# Patient Record
Sex: Male | Born: 1955
Health system: Southern US, Community
[De-identification: ages and names within clinical notes are randomized; demographics above are authoritative.]

## PROBLEM LIST (undated history)

## (undated) DIAGNOSIS — J45909 Unspecified asthma, uncomplicated: Secondary | ICD-10-CM

## (undated) DIAGNOSIS — Z87891 Personal history of nicotine dependence: Secondary | ICD-10-CM

## (undated) DIAGNOSIS — I10 Essential (primary) hypertension: Secondary | ICD-10-CM

## (undated) DIAGNOSIS — R29818 Other symptoms and signs involving the nervous system: Secondary | ICD-10-CM

## (undated) DIAGNOSIS — I4891 Unspecified atrial fibrillation: Secondary | ICD-10-CM

## (undated) DIAGNOSIS — I428 Other cardiomyopathies: Secondary | ICD-10-CM

## (undated) DIAGNOSIS — E119 Type 2 diabetes mellitus without complications: Secondary | ICD-10-CM

## (undated) HISTORY — PX: MOUTH SURGERY: SHX715

---

## 2015-07-31 DIAGNOSIS — I4891 Unspecified atrial fibrillation: Secondary | ICD-10-CM

## 2015-07-31 HISTORY — DX: Unspecified atrial fibrillation: I48.91

## 2015-08-04 ENCOUNTER — Inpatient Hospital Stay (HOSPITAL_COMMUNITY): Payer: BLUE CROSS/BLUE SHIELD

## 2015-08-04 ENCOUNTER — Inpatient Hospital Stay (HOSPITAL_COMMUNITY)
Admission: EM | Admit: 2015-08-04 | Discharge: 2015-08-12 | DRG: 308 | Disposition: A | Discharge status: Home / Self Care | Payer: BLUE CROSS/BLUE SHIELD | Attending: Cardiology | Admitting: Cardiology

## 2015-08-04 ENCOUNTER — Emergency Department (HOSPITAL_COMMUNITY): Payer: BLUE CROSS/BLUE SHIELD

## 2015-08-04 ENCOUNTER — Encounter (HOSPITAL_COMMUNITY): Payer: Self-pay

## 2015-08-04 DIAGNOSIS — R29818 Other symptoms and signs involving the nervous system: Secondary | ICD-10-CM | POA: Diagnosis present

## 2015-08-04 DIAGNOSIS — E119 Type 2 diabetes mellitus without complications: Secondary | ICD-10-CM

## 2015-08-04 DIAGNOSIS — I481 Persistent atrial fibrillation: Principal | ICD-10-CM | POA: Diagnosis present

## 2015-08-04 DIAGNOSIS — Z87891 Personal history of nicotine dependence: Secondary | ICD-10-CM | POA: Diagnosis not present

## 2015-08-04 DIAGNOSIS — Z23 Encounter for immunization: Secondary | ICD-10-CM | POA: Diagnosis not present

## 2015-08-04 DIAGNOSIS — J45998 Other asthma: Secondary | ICD-10-CM | POA: Diagnosis present

## 2015-08-04 DIAGNOSIS — Z6841 Body Mass Index (BMI) 40.0 and over, adult: Secondary | ICD-10-CM

## 2015-08-04 DIAGNOSIS — I110 Hypertensive heart disease with heart failure: Secondary | ICD-10-CM | POA: Diagnosis present

## 2015-08-04 DIAGNOSIS — E785 Hyperlipidemia, unspecified: Secondary | ICD-10-CM | POA: Diagnosis present

## 2015-08-04 DIAGNOSIS — G4733 Obstructive sleep apnea (adult) (pediatric): Secondary | ICD-10-CM | POA: Diagnosis present

## 2015-08-04 DIAGNOSIS — I4891 Unspecified atrial fibrillation: Secondary | ICD-10-CM

## 2015-08-04 DIAGNOSIS — Z8249 Family history of ischemic heart disease and other diseases of the circulatory system: Secondary | ICD-10-CM | POA: Diagnosis not present

## 2015-08-04 DIAGNOSIS — I5021 Acute systolic (congestive) heart failure: Secondary | ICD-10-CM

## 2015-08-04 DIAGNOSIS — E875 Hyperkalemia: Secondary | ICD-10-CM

## 2015-08-04 DIAGNOSIS — I5041 Acute combined systolic (congestive) and diastolic (congestive) heart failure: Secondary | ICD-10-CM | POA: Diagnosis not present

## 2015-08-04 DIAGNOSIS — I495 Sick sinus syndrome: Secondary | ICD-10-CM | POA: Diagnosis present

## 2015-08-04 DIAGNOSIS — R030 Elevated blood-pressure reading, without diagnosis of hypertension: Secondary | ICD-10-CM

## 2015-08-04 DIAGNOSIS — R739 Hyperglycemia, unspecified: Secondary | ICD-10-CM

## 2015-08-04 DIAGNOSIS — I509 Heart failure, unspecified: Secondary | ICD-10-CM

## 2015-08-04 DIAGNOSIS — E1165 Type 2 diabetes mellitus with hyperglycemia: Secondary | ICD-10-CM | POA: Diagnosis present

## 2015-08-04 DIAGNOSIS — I255 Ischemic cardiomyopathy: Secondary | ICD-10-CM | POA: Diagnosis present

## 2015-08-04 DIAGNOSIS — J45909 Unspecified asthma, uncomplicated: Secondary | ICD-10-CM | POA: Diagnosis present

## 2015-08-04 DIAGNOSIS — I340 Nonrheumatic mitral (valve) insufficiency: Secondary | ICD-10-CM | POA: Diagnosis not present

## 2015-08-04 DIAGNOSIS — N179 Acute kidney failure, unspecified: Secondary | ICD-10-CM | POA: Diagnosis present

## 2015-08-04 DIAGNOSIS — IMO0001 Reserved for inherently not codable concepts without codable children: Secondary | ICD-10-CM

## 2015-08-04 DIAGNOSIS — G4730 Sleep apnea, unspecified: Secondary | ICD-10-CM | POA: Diagnosis not present

## 2015-08-04 HISTORY — DX: Morbid (severe) obesity due to excess calories: E66.01

## 2015-08-04 HISTORY — DX: Personal history of nicotine dependence: Z87.891

## 2015-08-04 HISTORY — DX: Unspecified atrial fibrillation: I48.91

## 2015-08-04 HISTORY — DX: Essential (primary) hypertension: I10

## 2015-08-04 HISTORY — DX: Other cardiomyopathies: I42.8

## 2015-08-04 HISTORY — DX: Unspecified asthma, uncomplicated: J45.909

## 2015-08-04 HISTORY — DX: Other symptoms and signs involving the nervous system: R29.818

## 2015-08-04 LAB — PROTIME-INR
INR: 1.15 (ref 0.00–1.49)
INR: 1.2 (ref 0.00–1.49)
PROTHROMBIN TIME: 15.3 s — AB (ref 11.6–15.2)
Prothrombin Time: 14.8 seconds (ref 11.6–15.2)

## 2015-08-04 LAB — I-STAT TROPONIN, ED: TROPONIN I, POC: 0.01 ng/mL (ref 0.00–0.08)

## 2015-08-04 LAB — CBC WITH DIFFERENTIAL/PLATELET
BASOS ABS: 0.1 10*3/uL (ref 0.0–0.1)
Basophils Relative: 1 %
Eosinophils Absolute: 0.2 10*3/uL (ref 0.0–0.7)
Eosinophils Relative: 2 %
HEMATOCRIT: 48.3 % (ref 39.0–52.0)
HEMOGLOBIN: 15.4 g/dL (ref 13.0–17.0)
LYMPHS PCT: 20 %
Lymphs Abs: 1.7 10*3/uL (ref 0.7–4.0)
MCH: 29 pg (ref 26.0–34.0)
MCHC: 31.9 g/dL (ref 30.0–36.0)
MCV: 91 fL (ref 78.0–100.0)
MONO ABS: 0.4 10*3/uL (ref 0.1–1.0)
Monocytes Relative: 4 %
NEUTROS ABS: 6.2 10*3/uL (ref 1.7–7.7)
Neutrophils Relative %: 73 %
Platelets: 218 10*3/uL (ref 150–400)
RBC: 5.31 MIL/uL (ref 4.22–5.81)
RDW: 15.1 % (ref 11.5–15.5)
WBC: 8.5 10*3/uL (ref 4.0–10.5)

## 2015-08-04 LAB — TSH: TSH: 3.383 u[IU]/mL (ref 0.350–4.500)

## 2015-08-04 LAB — BASIC METABOLIC PANEL
Anion gap: 11 (ref 5–15)
BUN: 24 mg/dL — AB (ref 6–20)
CHLORIDE: 108 mmol/L (ref 101–111)
CO2: 20 mmol/L — AB (ref 22–32)
Calcium: 9.1 mg/dL (ref 8.9–10.3)
Creatinine, Ser: 1.24 mg/dL (ref 0.61–1.24)
GFR calc Af Amer: >60 mL/min (ref >60)
GFR calc non Af Amer: >60 mL/min (ref >60)
GLUCOSE: 143 mg/dL — AB (ref 65–99)
POTASSIUM: 5.2 mmol/L — AB (ref 3.5–5.1)
Sodium: 139 mmol/L (ref 135–145)

## 2015-08-04 LAB — T4, FREE: Free T4: 1.17 ng/dL — ABNORMAL HIGH (ref 0.61–1.12)

## 2015-08-04 LAB — BRAIN NATRIURETIC PEPTIDE: B Natriuretic Peptide: 214.6 pg/mL — ABNORMAL HIGH (ref 0.0–100.0)

## 2015-08-04 LAB — GLUCOSE, CAPILLARY: Glucose-Capillary: 159 mg/dL — ABNORMAL HIGH (ref 65–99)

## 2015-08-04 LAB — MAGNESIUM: Magnesium: 2.3 mg/dL (ref 1.7–2.4)

## 2015-08-04 LAB — APTT
APTT: 28 s (ref 24–37)
aPTT: 29 seconds (ref 24–37)

## 2015-08-04 LAB — PHOSPHORUS: Phosphorus: 2.6 mg/dL (ref 2.5–4.6)

## 2015-08-04 LAB — POTASSIUM: POTASSIUM: 4.7 mmol/L (ref 3.5–5.1)

## 2015-08-04 MED ORDER — SODIUM CHLORIDE 0.9 % IJ SOLN
3.0000 mL | Freq: Two times a day (BID) | INTRAMUSCULAR | Status: DC
  Administered 2015-08-04 – 2015-08-11 (×7): 3 mL via INTRAVENOUS

## 2015-08-04 MED ORDER — ACETAMINOPHEN 325 MG PO TABS: 650.0000 mg | ORAL_TABLET | ORAL | Status: DC | PRN

## 2015-08-04 MED ORDER — DILTIAZEM HCL 100 MG IV SOLR
5.0000 mg/h | INTRAVENOUS | Status: DC
  Administered 2015-08-04: 10 mg/h via INTRAVENOUS
  Administered 2015-08-05: 5 mg/h via INTRAVENOUS
  Filled 2015-08-04 (×2): qty 100

## 2015-08-04 MED ORDER — HEPARIN (PORCINE) IN NACL 100-0.45 UNIT/ML-% IJ SOLN
1500.0000 [IU]/h | INTRAMUSCULAR | Status: AC
  Administered 2015-08-04: 1500 [IU]/h via INTRAVENOUS
  Filled 2015-08-04: qty 250

## 2015-08-04 MED ORDER — HEPARIN (PORCINE) IN NACL 100-0.45 UNIT/ML-% IJ SOLN
1500.0000 [IU]/h | INTRAMUSCULAR | Status: DC
  Administered 2015-08-04: 1500 [IU]/h via INTRAVENOUS
  Filled 2015-08-04: qty 250

## 2015-08-04 MED ORDER — SODIUM CHLORIDE 0.9 % IV SOLN
250.0000 mL | INTRAVENOUS | Status: DC | PRN
  Administered 2015-08-12: 09:00:00 via INTRAVENOUS

## 2015-08-04 MED ORDER — ONDANSETRON HCL 4 MG/2ML IJ SOLN: 4.0000 mg | Freq: Four times a day (QID) | INTRAMUSCULAR | Status: DC | PRN

## 2015-08-04 MED ORDER — METOPROLOL TARTRATE 25 MG PO TABS
25.0000 mg | ORAL_TABLET | Freq: Two times a day (BID) | ORAL | Status: DC
  Administered 2015-08-04 – 2015-08-05 (×3): 25 mg via ORAL
  Filled 2015-08-04 (×3): qty 1

## 2015-08-04 MED ORDER — SODIUM CHLORIDE 0.9 % IJ SOLN: 3.0000 mL | INTRAMUSCULAR | Status: DC | PRN

## 2015-08-04 MED ORDER — FUROSEMIDE 10 MG/ML IJ SOLN
40.0000 mg | Freq: Two times a day (BID) | INTRAMUSCULAR | Status: DC
  Administered 2015-08-04 – 2015-08-08 (×8): 40 mg via INTRAVENOUS
  Filled 2015-08-04 (×8): qty 4

## 2015-08-04 MED ORDER — DILTIAZEM HCL 25 MG/5ML IV SOLN
10.0000 mg | Freq: Once | INTRAVENOUS | Status: AC
  Administered 2015-08-04: 10 mg via INTRAVENOUS

## 2015-08-04 MED ORDER — DILTIAZEM HCL 100 MG IV SOLR
5.0000 mg/h | Freq: Once | INTRAVENOUS | Status: AC
  Administered 2015-08-04: 5 mg/h via INTRAVENOUS
  Filled 2015-08-04: qty 100

## 2015-08-04 MED ORDER — HEPARIN BOLUS VIA INFUSION
6000.0000 [IU] | Freq: Once | INTRAVENOUS | Status: AC
  Administered 2015-08-04: 6000 [IU] via INTRAVENOUS
  Filled 2015-08-04: qty 6000

## 2015-08-04 MED ORDER — METOPROLOL TARTRATE 1 MG/ML IV SOLN
5.0000 mg | Freq: Once | INTRAVENOUS | Status: AC
  Administered 2015-08-04: 5 mg via INTRAVENOUS
  Filled 2015-08-04: qty 5

## 2015-08-04 MED ORDER — RIVAROXABAN 20 MG PO TABS
20.0000 mg | ORAL_TABLET | Freq: Every day | ORAL | Status: DC
  Administered 2015-08-04 – 2015-08-12 (×9): 20 mg via ORAL
  Filled 2015-08-04 (×9): qty 1

## 2015-08-04 NOTE — ED Notes (Signed)
Spoke w/ pharmacist about transitioning from Heparin to Xarelto - stop Heparin at same time as giving first dose of Xarelto.

## 2015-08-04 NOTE — Progress Notes (Addendum)
ANTICOAGULATION CONSULT NOTE - Initial Consult  Pharmacy Consult for heparin Indication: atrial fibrillation  No Known Allergies  Patient Measurements: Height: 5\' 8"  (172.7 cm) Weight: 293 lb (132.904 kg) IBW/kg (Calculated) : 68.4 Heparin Dosing Weight: 99.7kg  Vital Signs: Temp: 98.3 F (36.8 C) (01/05 0958) Temp Source: Oral (01/05 0958) BP: 144/92 mmHg (01/05 1045) Pulse Rate: 124 (01/05 1045)  Labs:  Recent Labs  08/04/15 1015  HGB 15.4  HCT 48.3  PLT 218  APTT 29  LABPROT 14.8  INR 1.15    CrCl cannot be calculated (Patient has no serum creatinine result on file.).   Medical History: Past Medical History  Diagnosis Date  . Asthma     Assessment: 59 yom with SOB. Pharmacy consulted to dose heparin for afib. No anticoag documented pta. CBC wnl, no bleed documented.  Goal of Therapy:  Heparin level 0.3-0.7 units/ml Monitor platelets by anticoagulation protocol: Yes   Plan:  Heparin 6000 unit bolus Heparin at 1500 units/h 6h HL Daily HL/CBC Mon s/sx bleeding  Babs Bertin, PharmD, BCPS Clinical Pharmacist Pager [phone] 08/04/2015 11:52 AM   ADDENDUM  Per MD - To switch to Xarelto later this evening (at supper ok). Communicated with RN to d/c Xarelto at time heparin drip is turned off. CrCl~87  Plan Heparin 1500 units/h >> Xarelto 20mg  qsupper - start this afternoon per MD Mon CBC, s/sx bleeding  Babs Bertin, PharmD, [facility] Clinical Pharmacist Pager [phone] 08/04/2015 1:56 PM

## 2015-08-04 NOTE — ED Provider Notes (Signed)
CSN: [id number]     Arrival date & time 08/04/15  4098 History   First MD Initiated Contact with Patient 08/04/15 [phone]     Chief Complaint  Patient presents with  . Shortness of Breath     (Consider location/radiation/quality/duration/timing/severity/associated sxs/prior Treatment) HPI 60 year old male who presents with dyspnea on exertion and fatigue. History of childhood asthma, and has not seen a medical care provider since 2005 so has unknown past medical history. States that since 5 days ago he has developed progressive dyspnea on exertion with fatigue. With daily activities that he is normally able to do, he feels winded and out of breath. Has had chronic edema in his lower extremities which she states has not changed. For the past year has also been sleeping upright because of difficulty breathing when he lays flat. No chest pain, syncope or near syncope. No recent illnesses and denies any fevers, cough, congestion, runny nose, abdominal pain, nausea or vomiting, or diarrhea. Denies any consistent alcohol usage.  Past Medical History  Diagnosis Date  . Asthma   . Morbid obesity (HCC)   . Suspected sleep apnea   . Former tobacco use    History reviewed. No pertinent past surgical history. Family History  Problem Relation Age of Onset  . CAD Mother     CABG age 44  . CAD Maternal Aunt     CABG age 93   Social History  Substance Use Topics  . Smoking status: Former Games developer  . Smokeless tobacco: None     Comment: Smoked for 20 years, quit ~1995  . Alcohol Use: No    Review of Systems 10/14 systems reviewed and are negative other than those stated in the HPI   Allergies  Review of patient's allergies indicates no known allergies.  Home Medications   Prior to Admission medications   Medication Sig Start Date End Date Taking? Authorizing Provider  ePHEDrine-GuaiFENesin (BRONKAID) 25-400 MG TABS Take 1 tablet by mouth every 6 (six) hours as needed (cough syptoms).   Yes  Historical Provider, MD   BP 131/101 mmHg  Pulse 126  Temp(Src) 98.3 F (36.8 C) (Oral)  Resp 29  Ht  (1.727 m)  Wt 293 lb (132.904 kg)  BMI 44.56 kg/m2  SpO2 95% Physical Exam Physical Exam  Nursing note and vitals reviewed. Constitutional: Well developed, well nourished, non-toxic, and in no acute distress Head: Normocephalic and atraumatic.  Mouth/Throat: Oropharynx is clear and moist.  Neck: Normal range of motion. Neck supple.  Cardiovascular: tachycardic rate and irregularly irregular rhythm, b/l trace pedal edema Pulmonary/Chest: Effort normal and breath sounds normal.  Abdominal: Soft. There is no tenderness. There is no rebound and no guarding.  Musculoskeletal: Normal range of motion.  Neurological: Alert, no facial droop, fluent speech, moves all extremities symmetrically Skin: Skin is warm and dry.  Psychiatric: Cooperative  ED Course  Procedures (including critical care time) Labs Review Labs Reviewed  PROTIME-INR - Abnormal; Notable for the following:    Prothrombin Time 15.3 (*)    All other components within normal limits  T4, FREE - Abnormal; Notable for the following:    Free T4 1.17 (*)    All other components within normal limits  BASIC METABOLIC PANEL - Abnormal; Notable for the following:    Potassium 5.2 (*)    CO2 20 (*)    Glucose, Bld 143 (*)    BUN 24 (*)    All other components within normal limits  CBC WITH DIFFERENTIAL/PLATELET  APTT  PROTIME-INR  TSH  APTT  MAGNESIUM  PHOSPHORUS  HEPARIN LEVEL (UNFRACTIONATED)  I-STAT TROPOININ, ED    Imaging Review Dg Chest Portable 1 View  08/04/2015  CLINICAL DATA:  Shortness of breath, atrial fibrillation EXAM: PORTABLE CHEST 1 VIEW COMPARISON:  None. FINDINGS: Diffuse bilateral interstitial thickening. Trace bilateral pleural effusions. No pneumothorax. Normal cardiomediastinal silhouette. No acute osseous abnormality. IMPRESSION: Findings most consistent with mild pulmonary edema.  Electronically Signed   By: Elige Ko   On: 08/04/2015 10:40   I have personally reviewed and evaluated these images and lab results as part of my medical decision-making.   EKG Interpretation   Date/Time:  Thursday August 04 2015 09:41:04 EST Ventricular Rate:  163 PR Interval:    QRS Duration: 82 QT Interval:  294 QTC Calculation: 484 R Axis:   100 Text Interpretation:  Atrial fibrillation Right axis deviation  Anteroseptal infarct, old Baseline wander in lead(s) V2 No prior for  comparison New onset atrial fibrillation Confirmed by Dare Spillman MD, Bud Kaeser [phone])  on 08/04/2015 10:41:26 AM      Angiocath insertion Performed by: Lavera Guise  Consent: Verbal consent obtained. Risks and benefits: risks, benefits and alternatives were discussed Time out: Immediately prior to procedure a "time out" was called to verify the correct patient, procedure, equipment, support staff and site/side marked as required.  Preparation: Patient was prepped and draped in the usual sterile fashion.  Vein Location: left antecubital  Ultrasound Guided  Gauge: 20G  Normal blood return and flush without difficulty Patient tolerance: Patient tolerated the procedure well with no immediate complications.   CRITICAL CARE Performed by: Lavera Guise   Total critical care time: 30 minutes  Critical care time was exclusive of separately billable procedures and treating other patients.  Critical care was necessary to treat or prevent imminent or life-threatening deterioration.  Critical care was time spent personally by me on the following activities: development of treatment plan with patient and/or surrogate as well as nursing, discussions with consultants, evaluation of patient's response to treatment, examination of patient, obtaining history from patient or surrogate, ordering and performing treatments and interventions, ordering and review of laboratory studies, ordering and review of radiographic  studies, pulse oximetry and re-evaluation of patient's condition.   MDM   Final diagnoses:  Atrial fibrillation with RVR (HCC)  Acute on chronic congestive heart failure, unspecified congestive heart failure type [facility])    60 year old male with history of asthma who presents with dyspnea on exertion and fatigue. He is noted to be in new onset atrial fibrillation with RVR at a heart rate in the 160s. He is hemodynamically stable and in no respiratory distress. Mildly fluid overloaded on exam, and chest x-ray showing mild interstitial pneumonia. I given his symptoms recently or do suspect that he may have new onset heart failure as well. His troponin is negative and she has no major metabolic or  Electrolyte derangements. Potassium is 5.6 but slightly hemolyzed without EKG changes. It started on a diltiazem drip and heparin drip. Heart rate improving in the 120s. Repeat hemodynamically stable and comfortably breathing on room air. I discussed this patient with cardiology service who will admit for ongoing management.    Lavera Guise, MD 08/04/15 [phone]

## 2015-08-04 NOTE — ED Notes (Signed)
Per EMS - pt c/o shortness of breath since Sunday. Pt reports orthopnea, dyspnea w/ exertion. Denies CP. Pt at [facility] and advised to come here d/t afib on monitor and shortness of breath. Pt took breathing pill this morning to attempt to alleviate symptoms.

## 2015-08-04 NOTE — Progress Notes (Signed)
  Echocardiogram 2D Echocardiogram has been performed.  Arvil Chaco 08/04/2015, 3:24 PM

## 2015-08-04 NOTE — ED Notes (Signed)
IV team at bedside to attempt to start second IV

## 2015-08-04 NOTE — ED Notes (Signed)
Attempted report x1. 

## 2015-08-04 NOTE — ED Notes (Signed)
EDP at bedside attempting to start IV w/ ultrasound.

## 2015-08-04 NOTE — ED Notes (Signed)
Contacted phleb to obtain blood - pt difficult stick and difficult draw

## 2015-08-04 NOTE — H&P (Signed)
History and Physical  Patient ID: James [name] MRN: [id number], DOB: September 13, 1955 Date of Encounter: 08/04/2015, 12:24 PM Primary Physician: None Primary Cardiologist: New - Dr. Antoine Poche  Chief Complaint: SOB Reason for Admission: new onset atrial fib and CHF Requesting MD: Dr. Verdie Mosher  HPI: [name] is a 60 y/o M with history of remote asthma, morbid obesity, suspected OSA, former tobacco abuse who presented to [facility] today with dyspnea since Sunday 07/31/15. He has no prior cardiac history. He has never been tested for sleep apnea but reports heavy snoring and having been told by an ex-girlfriend that he stops breathing in the middle of the night. He's noticed his weight gradually increasing over the last 20 years. He has had LEE for about a year. On 07/31/15 he began to develop what he thought was asthma - had issues with this remotely, but not recently. He took some Bronkaid tablets (ephedrine-guaifenesin) but symptoms did not improve. SOB gradually got worse, progressing to DOE with even minimal activity and orthopnea preventing him from sleeping. He almost called EMS last night due to PND. He went to urgent care this AM where initial BP was 158/98 and he was in rapid atrial fib. He was transported to [facility] where initial HR was 156. He received 10mg  IV diltiazem along with a drip that has since been titrated. HR was still 120s-130s so we gave 5mg  IV lopressor with improvement in HR down to 95-105. He has been started on IV heparin by the ER. CXR most c/w mild pulmonary edema. He denies any chest pain, awareness of palpitations, prior history of similar symptoms, history of TIA/stroke, or prior bleeding issues. He currently feels stable at rest. No recent wheezing. Labwork reveals BMET with K 5.2 (slight hemolysis), BUN/Cr 24/1.24, glucose 143, troponin neg x 1, CBC WNL.    Past Medical History  Diagnosis Date  . Asthma   . Morbid obesity (HCC)   . Suspected sleep apnea   . Former tobacco use       Surgical History: History reviewed. No pertinent past surgical history.   Home Meds: Prior to Admission medications   Medication Sig Start Date End Date Taking? Authorizing Provider  ePHEDrine-GuaiFENesin (BRONKAID) 25-400 MG TABS Take 1 tablet by mouth every 6 (six) hours as needed (cough syptoms).   Yes Historical Provider, MD    Allergies: No Known Allergies  Social History   Social History  . Marital Status: Single    Spouse Name: N/A  . Number of Children: N/A  . Years of Education: N/A   Occupational History  . Machinist    Social History Main Topics  . Smoking status: Former Games developer  . Smokeless tobacco: Not on file     Comment: Smoked for 20 years, quit ~1995  . Alcohol Use: No  . Drug Use: No  . Sexual Activity: Not on file   Other Topics Concern  . Not on file   Social History Narrative     Family History  Problem Relation Age of Onset  . CAD Mother     CABG age 47  . CAD Maternal Aunt     CABG age 74    Review of Systems:No fever or chills. All other systems reviewed and are otherwise negative except as noted above.  Labs:   Lab Results  Component Value Date   WBC 8.5 08/04/2015   HGB 15.4 08/04/2015   HCT 48.3 08/04/2015   MCV 91.0 08/04/2015   PLT 218 08/04/2015   BMET,  TSH pending  Radiology/Studies:  Dg Chest Portable 1 View  08/04/2015  CLINICAL DATA:  Shortness of breath, atrial fibrillation EXAM: PORTABLE CHEST 1 VIEW COMPARISON:  None. FINDINGS: Diffuse bilateral interstitial thickening. Trace bilateral pleural effusions. No pneumothorax. Normal cardiomediastinal silhouette. No acute osseous abnormality. IMPRESSION: Findings most consistent with mild pulmonary edema. Electronically Signed   By: Elige Ko   On: 08/04/2015 10:40   Wt Readings from Last 3 Encounters:  08/04/15 293 lb (132.904 kg)   EKG: atrial fib 163bpm, right axis deviation, no acute ST-T changes  Physical Exam: Blood pressure 131/101, pulse 126, temperature  98.3 F (36.8 C), temperature source Oral, resp. rate 29, height  (1.727 m), weight 293 lb (132.904 kg), SpO2 95 %. Body mass index is 44.56 kg/(m^2).  General: Well developed morbidly obese WM in no acute distress. Head: Normocephalic, atraumatic, sclera non-icteric, no xanthomas, nares are without discharge.  Neck: Negative for carotid bruits. JVD not elevated. Lungs: Clear bilaterally to auscultation without wheezes, rales, or rhonchi. Breathing is unlabored. Heart: Irregularly irregular, tachycardic. No murmurs, rubs, or gallops appreciated. Abdomen: Soft, non-tender, non-distended with normoactive bowel sounds. No hepatomegaly. No rebound/guarding. No obvious abdominal masses. Msk:  Strength and tone appear normal for age. Extremities: No clubbing or cyanosis. Chronic venous stasis appearing edema - 1-2+ bilaterally superimposed on baseline large leg habitus  Distal pedal pulses in tact but difficult to feel due to edema. Neuro: Alert and oriented X 3. No focal deficit. No facial asymmetry. Moves all extremities spontaneously. Psych:  Responds to questions appropriately with a normal affect.    ASSESSMENT AND PLAN:   1. Atrial fibrillation with RVR - symptoms present since 07/31/15. CHADSVASC is presently 1 for clinical CHF but glucose is 143 and BP has been elevated, suspicious for HTN - will follow BP and check A1C. Discussed risks and benefits of Coumadin vs DOAC and the patient is agreeable to DOAC. Per d/w MD, will start Xarelto - ordered as Xarelto per pharmacy so he receives new start education from pharmacist. Check echo. If EF normal, would consider 3 weeks of anticoagulation followed by DCCV. If there is evidence of LV dysfunction or HR becomes difficult to control, would consider TEE/DCCV this admission.  The patient believes his obesity and undiagnosed OSA contributed to onset which may be the case. He understands if these remain untreated, he is at high risk for recurrent  arrhythmias. He seems motivated to make a change. Continue IV diltiazem and add oral beta blocker. (Note no evidence of wheezing or asthma flare at this time.) Thyroid function pending.  2. Acute CHF, unknown if systolic or diastolic - check echo. Anticipate better rate control will help volume status. Start IV Lasix  BID. Follow I/O's and daily wts.  3. Morbid obesity with suspected sleep apnea - we discussed lifestyle modifications. He will need sleep study arranged at discharge.  4. Elevated blood pressure - may represent HTN. Follow.  5. Possible hyperkalemia - suspect hemoylsis. Repeat K with next lab check.  6. Hyperglycemia - check A1C.  Signed, Laurann Montana PA-C 08/04/2015, 12:24 PM Pager: [phone]  History and all data above reviewed.  Patient examined.  I agree with the findings as above.  Very nice patient who has not seen a physician in many years.  Now presents with one week of acute SOB and decreased exercise tolerance.  Found to be in atrial fib with rapid rate.  No chest pain.    The patient exam reveals [id number]  ,  Lungs: Clear  ,  Abd: Positive bowel sounds, no rebound no guarding, Ext Moderate edema   .  All available labs, radiology testing, previous records reviewed. Agree with documented assessment and plan. Atrial fib:  For not rate control and anticoagulation with probable need for DCCV in the future.  Needs diuresis.  Check echocardiogram.  IV Dilt for now.  Start beta blocker.  IV diuresis.  Out patient sleep study.  I started education on weight loss.    Evelio Cyrus [place]  2:05 PM  08/04/2015

## 2015-08-05 ENCOUNTER — Inpatient Hospital Stay (HOSPITAL_COMMUNITY): Payer: BLUE CROSS/BLUE SHIELD

## 2015-08-05 ENCOUNTER — Other Ambulatory Visit: Payer: Self-pay | Admitting: *Deleted

## 2015-08-05 ENCOUNTER — Encounter (HOSPITAL_COMMUNITY): Payer: Self-pay | Admitting: General Practice

## 2015-08-05 DIAGNOSIS — G4733 Obstructive sleep apnea (adult) (pediatric): Secondary | ICD-10-CM

## 2015-08-05 DIAGNOSIS — I5021 Acute systolic (congestive) heart failure: Secondary | ICD-10-CM

## 2015-08-05 LAB — LIPID PANEL
Cholesterol: 177 mg/dL (ref 0–200)
HDL: 35 mg/dL — ABNORMAL LOW (ref >40)
LDL CALC: 121 mg/dL — AB (ref 0–99)
TRIGLYCERIDES: 105 mg/dL (ref <150)
Total CHOL/HDL Ratio: 5.1 RATIO
VLDL: 21 mg/dL (ref 0–40)

## 2015-08-05 LAB — BASIC METABOLIC PANEL
ANION GAP: 10 (ref 5–15)
BUN: 35 mg/dL — ABNORMAL HIGH (ref 6–20)
CALCIUM: 9.3 mg/dL (ref 8.9–10.3)
CO2: 25 mmol/L (ref 22–32)
Chloride: 106 mmol/L (ref 101–111)
Creatinine, Ser: 1.85 mg/dL — ABNORMAL HIGH (ref 0.61–1.24)
GFR, EST AFRICAN AMERICAN: 45 mL/min — AB (ref >60)
GFR, EST NON AFRICAN AMERICAN: 39 mL/min — AB (ref >60)
GLUCOSE: 116 mg/dL — AB (ref 65–99)
POTASSIUM: 4.8 mmol/L (ref 3.5–5.1)
Sodium: 141 mmol/L (ref 135–145)

## 2015-08-05 LAB — CBC
HEMATOCRIT: 48.2 % (ref 39.0–52.0)
HEMOGLOBIN: 15.2 g/dL (ref 13.0–17.0)
MCH: 28.8 pg (ref 26.0–34.0)
MCHC: 31.5 g/dL (ref 30.0–36.0)
MCV: 91.3 fL (ref 78.0–100.0)
Platelets: 206 10*3/uL (ref 150–400)
RBC: 5.28 MIL/uL (ref 4.22–5.81)
RDW: 15.3 % (ref 11.5–15.5)
WBC: 9.5 10*3/uL (ref 4.0–10.5)

## 2015-08-05 LAB — HEMOGLOBIN A1C
HEMOGLOBIN A1C: 6.9 % — AB (ref 4.8–5.6)
MEAN PLASMA GLUCOSE: 151 mg/dL

## 2015-08-05 MED ORDER — INFLUENZA VAC SPLIT QUAD 0.5 ML IM SUSY
0.5000 mL | PREFILLED_SYRINGE | INTRAMUSCULAR | Status: AC
  Administered 2015-08-06: 0.5 mL via INTRAMUSCULAR
  Filled 2015-08-05: qty 0.5

## 2015-08-05 MED ORDER — CARVEDILOL 6.25 MG PO TABS
6.2500 mg | ORAL_TABLET | Freq: Two times a day (BID) | ORAL | Status: DC
  Administered 2015-08-05 – 2015-08-06 (×3): 6.25 mg via ORAL
  Filled 2015-08-05: qty 2
  Filled 2015-08-05 (×2): qty 1

## 2015-08-05 MED ORDER — LIVING WELL WITH DIABETES BOOK
Freq: Once | Status: AC
  Administered 2015-08-05: 17:00:00
  Filled 2015-08-05: qty 1

## 2015-08-05 MED ORDER — OFF THE BEAT BOOK
Freq: Once | Status: AC
  Administered 2015-08-05: 17:00:00
  Filled 2015-08-05: qty 1

## 2015-08-05 MED ORDER — REGADENOSON 0.4 MG/5ML IV SOLN
INTRAVENOUS | Status: AC
  Administered 2015-08-05: 0.4 mg via INTRAVENOUS
  Filled 2015-08-05: qty 5

## 2015-08-05 MED ORDER — REGADENOSON 0.4 MG/5ML IV SOLN
0.4000 mg | Freq: Once | INTRAVENOUS | Status: AC
  Administered 2015-08-05: 0.4 mg via INTRAVENOUS

## 2015-08-05 MED ORDER — PNEUMOCOCCAL VAC POLYVALENT 25 MCG/0.5ML IJ INJ
0.5000 mL | INJECTION | INTRAMUSCULAR | Status: AC
Start: 2015-08-06 — End: 2015-08-06
  Administered 2015-08-06: 0.5 mL via INTRAMUSCULAR
  Filled 2015-08-05: qty 0.5

## 2015-08-05 NOTE — Progress Notes (Signed)
Sleep study schedule @ [facility], Monday 08/08/15 @8pm . Otherwise no opening until Feb 2017. Case Manager will arrange CAPA.   Miraj Truss, PAC

## 2015-08-05 NOTE — Care Management Note (Addendum)
Case Management Note Donn Pierini RN, BSN Unit 2W-Case Manager [phone] Covering 3W  Patient Details  Name: James [name] MRN: [id number] Date of Birth: Dec 19, 1955  Subjective/Objective:  Pt admitted with new onset afib, pulm. edema                  Action/Plan: PTA pt lived at home, independent- anticipate return home when medically stable. Referral received for Xarelto benefits check- tried to submit request- but insurance states speciality med. Not covered?- spoke with pt at bedside- who states that he just started new plan with [facility] Jan. 1. And reports that plan has medication coverage. Explained to pt insurance benefit request and what they state to [place]- pt to call # on insurance card himself to request if Xarelto is covered and what his copay would be with his new plan. Gave pt both the 30 day free card and the copay assist card for Xarelto. Pt verbalized understanding that Xarelto copay cost may be high for him and understands to discuss with MD if this is the case on what an alternate medication may be.   Expected Discharge Date:                  Expected Discharge Plan:  Home/Self Care  In-House Referral:     Discharge planning Services  CM Consult, Medication Assistance  Post Acute Care Choice:    Choice offered to:     DME Arranged:    DME Agency:     HH Arranged:    HH Agency:     Status of Service:  In process, will continue to follow  Medicare Important Message Given:    Date Medicare IM Given:    Medicare IM give by:    Date Additional Medicare IM Given:    Additional Medicare Important Message give by:     If discussed at Long Length of Stay Meetings, dates discussed:    Additional Comments:  08/05/15- 1315- Donn Pierini RN, CM- referral for CPAP at home- also discussed with PA regarding sleep study who states message has been left for [facility]- they will call pt with appointment date, per PA- cpap settings are to be what they were here in hospital  for now. Order placed for CPAP- spoke with James [name] at [facility] regarding CPAP needs for home- he will f/u with pt prior to discharge.   Darrold Span, RN 08/05/2015, 11:49 AM

## 2015-08-05 NOTE — Progress Notes (Signed)
Patient Name: James [name] Date of Encounter: 08/05/2015   SUBJECTIVE  Slept well on CAPA. Breathing improved. No cp or palpitations. DC IV Cardizem overnight.   CURRENT MEDS . furosemide  40 mg Intravenous BID  . metoprolol tartrate  25 mg Oral BID  . rivaroxaban  20 mg Oral Q supper  . sodium chloride  3 mL Intravenous Q12H    OBJECTIVE  Filed Vitals:   08/04/15 2343 08/04/15 2358 08/05/15 0459 08/05/15 0755  BP: 101/63  133/98 136/97  Pulse: 74 75 76 81  Temp: 97.5 F (36.4 C)  97.8 F (36.6 C) 98.3 F (36.8 C)  TempSrc: Oral  Axillary Oral  Resp: Height:      Weight:   286 lb 8 oz (129.956 kg)   SpO2: 98% 97% 96% 97%    Intake/Output Summary (Last 24 hours) at 08/05/15 1011 Last data filed at 08/05/15 0900  Gross per 24 hour  Intake 494.55 ml  Output   2175 ml  Net -1680.45 ml   Filed Weights   08/04/15 0958 08/04/15 1554 08/05/15 0459  Weight: 293 lb (132.904 kg) 288 lb 12.8 oz (130.999 kg) 286 lb 8 oz (129.956 kg)    PHYSICAL EXAM  General: Pleasant, NAD. Neuro: Alert and oriented X 3. Moves all extremities spontaneously. Psych: Normal affect. HEENT:  Normal  Neck: Supple without bruits or JVD. Lungs:  Resp regular and unlabored. Bibasilar rales.  Heart: IR IR  no s3, s4, or murmurs. Abdomen: Soft, non-tender, non-distended, BS + x 4.  Extremities: No clubbing, cyanosis. Trace to 1+ BL LE edema. DP/PT/Radials 2+ and equal bilaterally.  Accessory Clinical Findings  CBC  Recent Labs  08/04/15 1015 08/05/15 0534  WBC 8.5 9.5  NEUTROABS 6.2  --   HGB 15.4 15.2  HCT 48.3 48.2  MCV 91.0 91.3  PLT 218 206   Basic Metabolic Panel  Recent Labs  08/04/15 1141 08/04/15 1343 08/05/15 0534  NA 139  --  141  K 5.2* 4.7 4.8  CL 108  --  106  CO2 20*  --  25  GLUCOSE 143*  --  116*  BUN 24*  --  35*  CREATININE 1.24  --  1.85*  CALCIUM 9.1  --  9.3  MG 2.3  --   --   PHOS 2.6  --   --    Hemoglobin A1C  Recent Labs  08/04/15 1343  HGBA1C 6.9*   Fasting Lipid Panel  Recent Labs  08/05/15 0534  CHOL 177  HDL 35*  LDLCALC 121*  TRIG 105  CHOLHDL 5.1   Thyroid Function Tests  Recent Labs  08/04/15 1141  TSH 3.383    TELE  afib at rate of 80-90s.   Echo 08/04/2015 LV EF: 20% -  25%  ------------------------------------------------------------------- Indications:   Atrial fibrillation - currently SR 427.31.  ------------------------------------------------------------------- History:  PMH: Morbid obesity. Suspected sleep apnea. Asthma.  ------------------------------------------------------------------- Study Conclusions  - Left ventricle: The cavity size was mildly dilated. Wall thickness was normal. Systolic function was severely reduced. The estimated ejection fraction was in the range of 20% to 25%. Diffuse hypokinesis. - Mitral valve: There was mild regurgitation. - Left atrium: The atrium was mildly dilated. - Right ventricle: Systolic function was mildly reduced.  Impressions:  - Severe global reduction in LV systolic function; mild LVE; mild LAE; mildly reduced RV function; mild MR.  Radiology/Studies  Dg Chest Portable 1 View  08/04/2015  CLINICAL DATA:  Shortness of breath, atrial fibrillation EXAM: PORTABLE CHEST 1 VIEW COMPARISON:  None. FINDINGS: Diffuse bilateral interstitial thickening. Trace bilateral pleural effusions. No pneumothorax. Normal cardiomediastinal silhouette. No acute osseous abnormality. IMPRESSION: Findings most consistent with mild pulmonary edema. Electronically Signed   By: Elige Ko   On: 08/04/2015 10:40    ASSESSMENT AND PLAN  1. New onset atrial fibrillation (HCC) - rate improved and DC IV cardizem overnight. Now rate in 80-90s, at times above 100s. Continue metoprolol 25mg  BID. ? Dose - CHADSVASC of at least 2 for (CHF and DM) ? HTN. BP stable. Anticoagulated with Xarelto.  - Echo showed severe global reduction in LV  systolic function; mild LVE; mild LAE; mildly reduced RV function; mild MR. - TEE/DCCV vs anticoagulation for 3-4 weeks and DCCV there after.   2. Acute systolic CHF with cardiomyopathy - likely ischemic - on Iv lasix 40mg  BID. Diuresed  1.68 L. Weight down 2 lb. Creatinine increased to 1.85. Follow closely with diuresis. - Echo result as above. Will need ischemic eval. Myoview vs cath. Patient had one small orange in morning. Will keep NPO until seen by MD. Last dose of Xarelto yesterday.   3. Suspected sleep apnea - Outpatient sleep study  4. Elevated BP - Stable now  5. DM - New diagnosis. Will place on SSI. Establish care with PCP. Case manger to help.   6. Morbid obesity - advise life style modification.   7. Hyperkalemia resolved.    Signed, Bhagat,Bhavinkumar PA-C Pager [phone]  History and all data above reviewed.  Patient examined.  I agree with the findings as above. Breathing better.  With CPAP he slept much better than he had previously.   The patient exam reveals KMM:[id number],  Lungs: Clear  ,  Abd: Positive bowel sounds, no rebound no guarding, Ext Moderate edema  .  All available labs, radiology testing, previous records reviewed. Agree with documented assessment and plan. CHF:  I don't suspect ischemia.  I will order a Lexiscan Myoview.  Change to Coreg.  Hold on ACE/ARB with increasing creatinine.  He still has excess volume.  I would like to continue the Lasix and keep his feet elevated.  He might need DCCV before discharge.  Follow the BMET closely.  Titrate meds (ACE/ARB or Bidil) before discharge.  Atrial fib:  Rate control with beta blocker.  I will stop Cardizem IV. Continue Xarelto but watch renal function.  Sleep Apnea:  Not a formal diagnosis but we need to expedite a diagnosis and prescription for CPAP.  DM:  New diagnosis.  Consult diabetes management.  HTN:  This is being managed in the context of treating his CHF  James [name]  12:08 PM  08/05/2015

## 2015-08-05 NOTE — Progress Notes (Signed)
Results for PRATT, REDA (MRN [id number]) as of 08/05/2015 13:46  Ref. Range 08/04/2015 13:43  Hemoglobin A1C Latest Ref Range: 4.8-5.6 % 6.9 (H)   Referral received due to new diagnosis of diabetes.  Patient currently in Nuclear medicine for test.  Ordered Living well with diabetes booklet, videos, and dietician consult. Discussed with RN.  Will need PCP after discharge to determine diabetes plan of care.  May consider adding Novolog correction tid with meals and HS.    Thanks, Beryl Meager, RN, BC-ADM Inpatient Diabetes Coordinator Pager [phone] (8a-5p)

## 2015-08-05 NOTE — Progress Notes (Signed)
Brief Nutrition Note  RD consulted for diet education for new onset diabetes.   Lab Results  Component Value Date   HGBA1C 6.9* 08/04/2015   Spoke with DM coordinator prior to visit, who reveals she tried to educate pt, however, he just went down for a nuclear medicine procedure. She reveals Hgb A1c is 6.9 and "this is all new to him". Unsure of when pt will return to room.  RD provided handouts from [facility] Nutrition Care Manual ("Low Sodium Nutrition Therapy" and "Carbohydrate Counting For People with Diabetes") as well as plate method handout and left at pt bedside.   RD will attempt to follow-up on Monday, 08/08/15 for follow-up/question/reinforcement if pt is still hospitalized.   Body mass index is 43.57 kg/(m^2). Patient meets criteria for extreme obesity, class III based on current BMI.   Current diet order is 2 grams NA with 1800 ml fluid restriction, patient is consuming approximately 100% of meals at this time. Labs and medications reviewed.   If further nutrition issues arise, please consult RD.   Taniya Dasher A. Mayford Knife, RD, LDN, CDE Pager: [phone] After hours Pager: [phone]

## 2015-08-05 NOTE — Progress Notes (Signed)
Patient's heart rate 55-60 nonsustained. IV Cardizem drip paused and Cardiology Fellow on call paged and aware. Patient has no complaint of dizziness and asymptomatic at this time. Will continue to monitor.

## 2015-08-05 NOTE — Discharge Instructions (Addendum)
Atrial Fibrillation °Atrial fibrillation is a type of irregular or rapid heartbeat (arrhythmia). In atrial fibrillation, the heart quivers continuously in a chaotic pattern. This occurs when parts of the heart receive disorganized signals that make the heart unable to pump blood normally. This can increase the risk for stroke, heart failure, and other heart-related conditions. There are different types of atrial fibrillation, including: °· Paroxysmal atrial fibrillation. This type starts suddenly, and it usually stops on its own shortly after it starts. °· Persistent atrial fibrillation. This type often lasts longer than a week. It may stop on its own or with treatment. °· Long-lasting persistent atrial fibrillation. This type lasts longer than 12 months. °· Permanent atrial fibrillation. This type does not go away. °Talk with your health care provider to learn about the type of atrial fibrillation that you have. °CAUSES °This condition is caused by some heart-related conditions or procedures, including: °· A heart attack. °· Coronary artery disease. °· Heart failure. °· Heart valve conditions. °· High blood pressure. °· Inflammation of the sac that surrounds the heart (pericarditis). °· Heart surgery. °· Certain heart rhythm disorders, such as Wolf-Parkinson-White syndrome. °Other causes include: °· Pneumonia. °· Obstructive sleep apnea. °· Blockage of an artery in the lungs (pulmonary embolism, or PE). °· Lung cancer. °· Chronic lung disease. °· Thyroid problems, especially if the thyroid is overactive (hyperthyroidism). °· Caffeine. °· Excessive alcohol use or illegal drug use. °· Use of some medicines, including certain decongestants and diet pills. °Sometimes, the cause cannot be found. °RISK FACTORS °This condition is more likely to develop in: °· People who are older in age. °· People who smoke. °· People who have diabetes mellitus. °· People who are overweight (obese). °· Athletes who exercise  vigorously. °SYMPTOMS °Symptoms of this condition include: °· A feeling that your heart is beating rapidly or irregularly. °· A feeling of discomfort or pain in your chest. °· Shortness of breath. °· Sudden light-headedness or weakness. °· Getting tired easily during exercise. °In some cases, there are no symptoms. °DIAGNOSIS °Your health care provider may be able to detect atrial fibrillation when taking your pulse. If detected, this condition may be diagnosed with: °· An electrocardiogram (ECG). °· A Holter monitor test that records your heartbeat patterns over a 24-hour period. °· Transthoracic echocardiogram (TTE) to evaluate how blood flows through your heart. °· Transesophageal echocardiogram (TEE) to view more detailed images of your heart. °· A stress test. °· Imaging tests, such as a CT scan or chest X-ray. °· Blood tests. °TREATMENT °The main goals of treatment are to prevent blood clots from forming and to keep your heart beating at a normal rate and rhythm. The type of treatment that you receive depends on many factors, such as your underlying medical conditions and how you feel when you are experiencing atrial fibrillation. °This condition may be treated with: °· Medicine to slow down the heart rate, bring the heart's rhythm back to normal, or prevent clots from forming. °· Electrical cardioversion. This is a procedure that resets your heart's rhythm by delivering a controlled, low-energy shock to the heart through your skin. °· Different types of ablation, such as catheter ablation, catheter ablation with pacemaker, or surgical ablation. These procedures destroy the heart tissues that send abnormal signals. When the pacemaker is used, it is placed under your skin to help your heart beat in a regular rhythm. °HOME CARE INSTRUCTIONS °· Take over-the counter and prescription medicines only as told by your health care provider. °·   If your health care provider prescribed a blood-thinning medicine  (anticoagulant), take it exactly as told. Taking too much blood-thinning medicine can cause bleeding. If you do not take enough blood-thinning medicine, you will not have the protection that you need against stroke and other problems.  Do not use tobacco products, including cigarettes, chewing tobacco, and e-cigarettes. If you need help quitting, ask your health care provider.  If you have obstructive sleep apnea, manage your condition as told by your health care provider.  Do not drink alcohol.  Do not drink beverages that contain caffeine, such as coffee, soda, and tea.  Maintain a healthy weight. Do not use diet pills unless your health care provider approves. Diet pills may make heart problems worse.  Follow diet instructions as told by your health care provider.  Exercise regularly as told by your health care provider.  Keep all follow-up visits as told by your health care provider. This is important. PREVENTION  Avoid drinking beverages that contain caffeine or alcohol.  Avoid certain medicines, especially medicines that are used for breathing problems.  Avoid certain herbs and herbal medicines, such as those that contain ephedra or ginseng.  Do not use illegal drugs, such as cocaine and amphetamines.  Do not smoke.  Manage your high blood pressure. SEEK MEDICAL CARE IF:  You notice a change in the rate, rhythm, or strength of your heartbeat.  You are taking an anticoagulant and you notice increased bruising.  You tire more easily when you exercise or exert yourself. SEEK IMMEDIATE MEDICAL CARE IF:  You have chest pain, abdominal pain, sweating, or weakness.  You feel nauseous.  You notice blood in your vomit, bowel movement, or urine.  You have shortness of breath.  You suddenly have swollen feet and ankles.  You feel dizzy.  You have sudden weakness or numbness of the face, arm, or leg, especially on one side of the body.  You have trouble speaking,  trouble understanding, or both (aphasia).  Your face or your eyelid droops on one side. These symptoms may represent a serious problem that is an emergency. Do not wait to see if the symptoms will go away. Get medical help right away. Call your local emergency services (911 in the [place] Do not drive yourself to the hospital.   This information is not intended to replace advice given to you by your health care provider. Make sure you discuss any questions you have with your health care provider.   Document Released: 07/16/2005 Document Revised: 04/06/2015 Document Reviewed: 11/10/2014 Elsevier Interactive Patient Education 2016 [employer].   Information on my medicine - XARELTO (Rivaroxaban)  This medication education was reviewed with me or my healthcare representative as part of my discharge preparation.    Why was Xarelto prescribed for you? Xarelto was prescribed for you to reduce the risk of a blood clot forming that can cause a stroke if you have a medical condition called atrial fibrillation (a type of irregular heartbeat).  What do you need to know about xarelto ? Take your Xarelto ONCE DAILY at the same time every day with your evening meal. If you have difficulty swallowing the tablet whole, you may crush it and mix in applesauce just prior to taking your dose.  Take Xarelto exactly as prescribed by your doctor and DO NOT stop taking Xarelto without talking to the doctor who prescribed the medication.  Stopping without other stroke prevention medication to take the place of Xarelto may increase your risk of  developing a clot that causes a stroke.  Refill your prescription before you run out.  After discharge, you should have regular check-up appointments with your healthcare provider that is prescribing your Xarelto.  In the future your dose may need to be changed if your kidney function or weight changes by a significant amount.  What do you do if you miss a dose? If  you are taking Xarelto ONCE DAILY and you miss a dose, take it as soon as you remember on the same day then continue your regularly scheduled once daily regimen the next day. Do not take two doses of Xarelto at the same time or on the same day.   Important Safety Information A possible side effect of Xarelto is bleeding. You should call your healthcare provider right away if you experience any of the following: ? Bleeding from an injury or your nose that does not stop. ? Unusual colored urine (red or dark brown) or unusual colored stools (red or black). ? Unusual bruising for unknown reasons. ? A serious fall or if you hit your head (even if there is no bleeding).  Some medicines may interact with Xarelto and might increase your risk of bleeding while on Xarelto. To help avoid this, consult your healthcare provider or pharmacist prior to using any new prescription or non-prescription medications, including herbals, vitamins, non-steroidal anti-inflammatory drugs (NSAIDs) and supplements.  This website has more information on Xarelto: VisitDestination.com.br.

## 2015-08-05 NOTE — Progress Notes (Signed)
Rt Note: CPAP auto set-up at bedside. Pt states he can place mask on when ready. I told him to call if he needs any further assistance.

## 2015-08-05 NOTE — Progress Notes (Signed)
Utilization review completed.  

## 2015-08-06 LAB — BASIC METABOLIC PANEL
Anion gap: 7 (ref 5–15)
BUN: 35 mg/dL — ABNORMAL HIGH (ref 6–20)
CALCIUM: 9.2 mg/dL (ref 8.9–10.3)
CO2: 29 mmol/L (ref 22–32)
CREATININE: 1.69 mg/dL — AB (ref 0.61–1.24)
Chloride: 106 mmol/L (ref 101–111)
GFR calc non Af Amer: 43 mL/min — ABNORMAL LOW (ref >60)
GFR, EST AFRICAN AMERICAN: 50 mL/min — AB (ref >60)
Glucose, Bld: 108 mg/dL — ABNORMAL HIGH (ref 65–99)
Potassium: 3.9 mmol/L (ref 3.5–5.1)
SODIUM: 142 mmol/L (ref 135–145)

## 2015-08-06 LAB — NM MYOCAR MULTI W/SPECT W/WALL MOTION / EF
CHL CUP RESTING HR STRESS: 94 {beats}/min
CSEPED: 5 min
CSEPEW: 1 METS
CSEPPHR: 109 {beats}/min
MPHR: 163 {beats}/min
Percent HR: 66 %

## 2015-08-06 LAB — CBC
HCT: 47.5 % (ref 39.0–52.0)
Hemoglobin: 14.9 g/dL (ref 13.0–17.0)
MCH: 28.8 pg (ref 26.0–34.0)
MCHC: 31.4 g/dL (ref 30.0–36.0)
MCV: 91.7 fL (ref 78.0–100.0)
PLATELETS: 199 10*3/uL (ref 150–400)
RBC: 5.18 MIL/uL (ref 4.22–5.81)
RDW: 15.2 % (ref 11.5–15.5)
WBC: 8.7 10*3/uL (ref 4.0–10.5)

## 2015-08-06 LAB — GLUCOSE, CAPILLARY: GLUCOSE-CAPILLARY: 109 mg/dL — AB (ref 65–99)

## 2015-08-06 MED ORDER — TECHNETIUM TC 99M SESTAMIBI GENERIC - CARDIOLITE
30.0000 | Freq: Once | INTRAVENOUS | Status: AC | PRN
  Administered 2015-08-06: 30 via INTRAVENOUS

## 2015-08-06 MED ORDER — TECHNETIUM TC 99M SESTAMIBI GENERIC - CARDIOLITE
30.0000 | Freq: Once | INTRAVENOUS | Status: AC | PRN
  Administered 2015-08-05: 30 via INTRAVENOUS

## 2015-08-06 NOTE — Plan of Care (Signed)
Problem: Activity: Goal: Risk for activity intolerance will decrease Outcome: Progressing Able to increase activity tolerance.   Problem: Fluid Volume: Goal: Ability to maintain a balanced intake and output will improve Outcome: Progressing Monitor Fluid balance.

## 2015-08-06 NOTE — Progress Notes (Signed)
Subjective:  Feeling better this morning with less dyspnea.  To have second part of nuclear stress test done today.  Objective:  Vital Signs in the last 24 hours: BP 112/76 mmHg  Pulse 79  Temp(Src) 97.8 F (36.6 C) (Oral)  Resp 18  Ht 5\' 8"  (1.727 m)  Wt 127.506 kg (281 lb 1.6 oz)  BMI 42.75 kg/m2  SpO2 100%  Physical Exam: Pleasant obese male in no acute distress Lungs: Mild rales  Cardiac:  Rapid irregular rhythm, normal S1 and S2, no S3 Abdomen:  Soft, nontender, no masses Extremities:  2+ edema present  Intake/Output from previous day: 01/06 0701 - 01/07 0700 In: 843 [P.O.:840; I.V.:3] Out: 3525 [Urine:3525] Weight Filed Weights   08/04/15 1554 08/05/15 0459 08/06/15 0534  Weight: 130.999 kg (288 lb 12.8 oz) 129.956 kg (286 lb 8 oz) 127.506 kg (281 lb 1.6 oz)    Lab Results: Basic Metabolic Panel:  Recent Labs  05/69/79 0534 08/06/15 0414  NA 141 142  K 4.8 3.9  CL 106 106  CO2 25 29  GLUCOSE 116* 108*  BUN 35* 35*  CREATININE 1.85* 1.69*   CBC:  Recent Labs  08/04/15 1015 08/05/15 0534 08/06/15 0414  WBC 8.5 9.5 8.7  NEUTROABS 6.2  --   --   HGB 15.4 15.2 14.9  HCT 48.3 48.2 47.5  MCV 91.0 91.3 91.7  PLT 218 206 199   BNP    Component Value Date/Time   BNP 214.6* 08/04/2015 1343   Telemetry: Some atrial fibrillation with pauses and slow response overnight but this morning is in rapid response while sitting up in the chair.  Assessment/Plan:  1.  Acute systolic congestive heart failure with improvement in symptoms and weight 2.  Atrial fibrillation persistent with tachybradycardia syndrome 3.  Sleep apnea 4.  Morbid obesity 5.  Acute renal failure possibly on chronic but improvement today  Recommendations:  Continue intravenous diuresis today.  ACE inhibitor is on hold but renal function is better.  Continue beta blockers and await results of nuclear stress test.  With tachycardia-bradycardia syndrome an early cardioversion may be a  better option for him and discussed the possibility of a TEE cardioversion on Monday.      James Palmer  MD [facility]  08/06/2015, 8:20 AM

## 2015-08-07 LAB — BASIC METABOLIC PANEL
Anion gap: 11 (ref 5–15)
BUN: 33 mg/dL — AB (ref 6–20)
CHLORIDE: 104 mmol/L (ref 101–111)
CO2: 26 mmol/L (ref 22–32)
CREATININE: 1.55 mg/dL — AB (ref 0.61–1.24)
Calcium: 9.5 mg/dL (ref 8.9–10.3)
GFR, EST AFRICAN AMERICAN: 56 mL/min — AB (ref >60)
GFR, EST NON AFRICAN AMERICAN: 48 mL/min — AB (ref >60)
Glucose, Bld: 110 mg/dL — ABNORMAL HIGH (ref 65–99)
POTASSIUM: 3.7 mmol/L (ref 3.5–5.1)
SODIUM: 141 mmol/L (ref 135–145)

## 2015-08-07 LAB — CBC
HCT: 52.2 % — ABNORMAL HIGH (ref 39.0–52.0)
HEMOGLOBIN: 16.5 g/dL (ref 13.0–17.0)
MCH: 29 pg (ref 26.0–34.0)
MCHC: 31.6 g/dL (ref 30.0–36.0)
MCV: 91.7 fL (ref 78.0–100.0)
PLATELETS: 196 10*3/uL (ref 150–400)
RBC: 5.69 MIL/uL (ref 4.22–5.81)
RDW: 14.9 % (ref 11.5–15.5)
WBC: 8.9 10*3/uL (ref 4.0–10.5)

## 2015-08-07 LAB — GLUCOSE, CAPILLARY: Glucose-Capillary: 121 mg/dL — ABNORMAL HIGH (ref 65–99)

## 2015-08-07 MED ORDER — CARVEDILOL 12.5 MG PO TABS
12.5000 mg | ORAL_TABLET | Freq: Two times a day (BID) | ORAL | Status: DC
  Administered 2015-08-07 – 2015-08-12 (×12): 12.5 mg via ORAL
  Filled 2015-08-07 (×12): qty 1

## 2015-08-07 MED ORDER — POTASSIUM CHLORIDE CRYS ER 20 MEQ PO TBCR
20.0000 meq | EXTENDED_RELEASE_TABLET | Freq: Every day | ORAL | Status: DC
  Administered 2015-08-07 – 2015-08-12 (×6): 20 meq via ORAL
  Filled 2015-08-07 (×6): qty 1

## 2015-08-07 MED ORDER — SODIUM CHLORIDE 0.9 % IV SOLN
INTRAVENOUS | Status: DC
  Administered 2015-08-07: 17:00:00 via INTRAVENOUS

## 2015-08-07 MED ORDER — SPIRONOLACTONE 25 MG PO TABS
25.0000 mg | ORAL_TABLET | Freq: Every day | ORAL | Status: DC
  Administered 2015-08-07 – 2015-08-11 (×5): 25 mg via ORAL
  Filled 2015-08-07 (×5): qty 1

## 2015-08-07 NOTE — Progress Notes (Signed)
ANTICOAGULATION CONSULT NOTE   Pharmacy Consult for Xarelto Indication: atrial fibrillation  No Known Allergies  Patient Measurements: Height: 5\' 8"  (172.7 cm) Weight: 274 lb 12.8 oz (124.648 kg) IBW/kg (Calculated) : 68.4 Heparin Dosing Weight: 99.7kg  Vital Signs: Temp: 98 F (36.7 C) (01/08 0400) BP: 125/77 mmHg (01/08 0400) Pulse Rate: 102 (01/08 0400)  Labs:  Recent Labs  08/04/15 1015  08/04/15 1141 08/05/15 0534 08/06/15 0414 08/07/15 0542  HGB 15.4  --   --  15.2 14.9 16.5  HCT 48.3  --   --  48.2 47.5 52.2*  PLT 218  --   --  206 199 196  APTT 29  --  28  --   --   --   LABPROT 14.8  --  15.3*  --   --   --   INR 1.15  --  1.20  --   --   --   CREATININE  --   < > 1.24 1.85* 1.69* 1.55*  < > = values in this interval not displayed.  Estimated Creatinine Clearance: 67.6 mL/min (by C-G formula based on Cr of 1.55).   Medical History: Past Medical History  Diagnosis Date  . Asthma   . Morbid obesity (HCC)   . Suspected sleep apnea   . Former tobacco use   . New onset atrial fibrillation (HCC) 07/2015    Assessment: 59 yom with SOB. Pharmacy consulted to dose heparin then transitioned to Xarelto for afib. No anticoag documented pta. CBC wnl, no bleed documented.  Scr 1.55, CrCl ~ 65 ml/min > Xarelto 20 mg daily appropriate, CBC stable.  Plan: 1. Continue Xarelto 20 mg daily. 2. F/u plans for cardioversion. 3. Patient education completed 1/6.  Tad Moore, BCPS  Clinical Pharmacist Pager [phone]  08/07/2015 9:18 AM

## 2015-08-07 NOTE — Progress Notes (Signed)
CPAP set up at bedside with FFM. Auto titrate settings. Distilled H20 added for humidity. Patient states he is able to place himself on /off as needed.

## 2015-08-07 NOTE — Progress Notes (Addendum)
Subjective:  He feels good this morning without dyspnea.  Continued with excellent diuresis overnight.  Renal function is improving.  Weight is down 12 pounds over the past couple of days.  Nuclear stress test showed an EF of 30% with possible attenuation in the inferior zone but no evidence of ischemia.  No chest pain.  Continues with the rapid atrial fibrillation at night but some bradycardia at night.  Objective:  Vital Signs in the last 24 hours: BP 125/77 mmHg  Pulse 102  Temp(Src) 98 F (36.7 C) (Oral)  Resp 23  Ht 5\' 8"  (1.727 m)  Wt 124.648 kg (274 lb 12.8 oz)  BMI 41.79 kg/m2  SpO2 99%  Physical Exam: Pleasant obese male in no acute distress Lungs: Lungs clear today Cardiac:  Rapid irregular rhythm, normal S1 and S2, no S3 Abdomen:  Soft, nontender, no masses Extremities:  Trace edema present  Intake/Output from previous day: 01/07 0701 - 01/08 0700 In: 683 [P.O.:680; I.V.:3] Out: 3650 [Urine:3650] Weight Filed Weights   08/05/15 0459 08/06/15 0534 08/07/15 0400  Weight: 129.956 kg (286 lb 8 oz) 127.506 kg (281 lb 1.6 oz) 124.648 kg (274 lb 12.8 oz)    Lab Results: Basic Metabolic Panel:  Recent Labs  59/29/24 0414 08/07/15 0542  NA 142 141  K 3.9 3.7  CL 106 104  CO2 29 26  GLUCOSE 108* 110*  BUN 35* 33*  CREATININE 1.69* 1.55*   CBC:  Recent Labs  08/04/15 1015  08/06/15 0414 08/07/15 0542  WBC 8.5  < > 8.7 8.9  NEUTROABS 6.2  --   --   --   HGB 15.4  < > 14.9 16.5  HCT 48.3  < > 47.5 52.2*  MCV 91.0  < > 91.7 91.7  PLT 218  < > 199 196  < > = values in this interval not displayed. BNP    Component Value Date/Time   BNP 214.6* 08/04/2015 1343   Telemetry: Atrial fibrillation slightly rapid while sitting up in chair but some bradycardia noted overnight  Assessment/Plan:  1.  Acute systolic congestive heart failure with improvement in symptoms and weight 2.  Atrial fibrillation persistent with tachybradycardia syndrome 3.  Sleep  apnea 4.  Morbid obesity 5.  Acute renal failure possibly on chronic tinnitus to improve   Recommendations:  Discussed TEE cardioversion with the patient including risks and will keep nothing by mouth to see if this can be done tomorrow hopefully to help with improvement in LV function earlier rather than later.  Continues to diurese well and will continue intravenous diuresis today.  He is anticoagulated.  Increase beta blockers and consider restarting ACE inhibitor after TEE cardioversion since renal function appears to be improving and was on hold because of a decline.  Add spironolactone today.      Darden Palmer  MD [facility]  08/07/2015, 8:24 AM

## 2015-08-08 ENCOUNTER — Inpatient Hospital Stay (HOSPITAL_COMMUNITY): Payer: BLUE CROSS/BLUE SHIELD

## 2015-08-08 ENCOUNTER — Inpatient Hospital Stay (HOSPITAL_COMMUNITY): Payer: BLUE CROSS/BLUE SHIELD | Admitting: Anesthesiology

## 2015-08-08 ENCOUNTER — Encounter (HOSPITAL_COMMUNITY): Payer: Self-pay | Admitting: *Deleted

## 2015-08-08 ENCOUNTER — Encounter (HOSPITAL_COMMUNITY)
Admission: EM | Disposition: A | Discharge status: Home / Self Care | Payer: Self-pay | Source: Home / Self Care | Attending: Cardiology

## 2015-08-08 ENCOUNTER — Ambulatory Visit (HOSPITAL_BASED_OUTPATIENT_CLINIC_OR_DEPARTMENT_OTHER): Payer: BLUE CROSS/BLUE SHIELD

## 2015-08-08 DIAGNOSIS — I340 Nonrheumatic mitral (valve) insufficiency: Secondary | ICD-10-CM

## 2015-08-08 DIAGNOSIS — R030 Elevated blood-pressure reading, without diagnosis of hypertension: Secondary | ICD-10-CM

## 2015-08-08 DIAGNOSIS — G4730 Sleep apnea, unspecified: Secondary | ICD-10-CM

## 2015-08-08 DIAGNOSIS — I4891 Unspecified atrial fibrillation: Secondary | ICD-10-CM

## 2015-08-08 HISTORY — PX: TEE WITHOUT CARDIOVERSION: SHX5443

## 2015-08-08 HISTORY — PX: CARDIOVERSION: SHX1299

## 2015-08-08 LAB — BASIC METABOLIC PANEL
ANION GAP: 13 (ref 5–15)
BUN: 31 mg/dL — ABNORMAL HIGH (ref 6–20)
CHLORIDE: 105 mmol/L (ref 101–111)
CO2: 23 mmol/L (ref 22–32)
CREATININE: 1.47 mg/dL — AB (ref 0.61–1.24)
Calcium: 9.5 mg/dL (ref 8.9–10.3)
GFR calc non Af Amer: 51 mL/min — ABNORMAL LOW (ref >60)
GFR, EST AFRICAN AMERICAN: 59 mL/min — AB (ref >60)
Glucose, Bld: 105 mg/dL — ABNORMAL HIGH (ref 65–99)
POTASSIUM: 5.9 mmol/L — AB (ref 3.5–5.1)
SODIUM: 141 mmol/L (ref 135–145)

## 2015-08-08 LAB — HEPATIC FUNCTION PANEL
ALT: 41 U/L (ref 17–63)
AST: 26 U/L (ref 15–41)
Albumin: 4.1 g/dL (ref 3.5–5.0)
Alkaline Phosphatase: 57 U/L (ref 38–126)
Bilirubin, Direct: 0.2 mg/dL (ref 0.1–0.5)
Indirect Bilirubin: 1 mg/dL — ABNORMAL HIGH (ref 0.3–0.9)
Total Bilirubin: 1.2 mg/dL (ref 0.3–1.2)
Total Protein: 7.2 g/dL (ref 6.5–8.1)

## 2015-08-08 LAB — CBC
HEMATOCRIT: 50.6 % (ref 39.0–52.0)
HEMOGLOBIN: 17.3 g/dL — AB (ref 13.0–17.0)
MCH: 30.1 pg (ref 26.0–34.0)
MCHC: 34.2 g/dL (ref 30.0–36.0)
MCV: 88 fL (ref 78.0–100.0)
Platelets: 198 10*3/uL (ref 150–400)
RBC: 5.75 MIL/uL (ref 4.22–5.81)
RDW: 14.8 % (ref 11.5–15.5)
WBC: 8.3 10*3/uL (ref 4.0–10.5)

## 2015-08-08 SURGERY — ECHOCARDIOGRAM, TRANSESOPHAGEAL
Anesthesia: Monitor Anesthesia Care

## 2015-08-08 MED ORDER — FENTANYL CITRATE (PF) 100 MCG/2ML IJ SOLN
INTRAMUSCULAR | Status: DC | PRN
  Administered 2015-08-08: 50 ug via INTRAVENOUS

## 2015-08-08 MED ORDER — SILVER SULFADIAZINE 1 % EX CREA
TOPICAL_CREAM | Freq: Two times a day (BID) | CUTANEOUS | Status: DC
Start: 2015-08-08 — End: 2015-08-12
  Administered 2015-08-08: 1 via TOPICAL
  Administered 2015-08-08 – 2015-08-09 (×2): via TOPICAL
  Filled 2015-08-08: qty 85

## 2015-08-08 MED ORDER — SODIUM CHLORIDE 0.9 % IJ SOLN: 3.0000 mL | Freq: Two times a day (BID) | INTRAMUSCULAR | Status: DC

## 2015-08-08 MED ORDER — PROPOFOL 500 MG/50ML IV EMUL
INTRAVENOUS | Status: DC | PRN
  Administered 2015-08-08: 50 ug/kg/min via INTRAVENOUS

## 2015-08-08 MED ORDER — AMIODARONE HCL IN DEXTROSE 360-4.14 MG/200ML-% IV SOLN
60.0000 mg/h | INTRAVENOUS | Status: AC
  Administered 2015-08-08: 60 mg/h via INTRAVENOUS
  Filled 2015-08-08: qty 200

## 2015-08-08 MED ORDER — LACTATED RINGERS IV SOLN
INTRAVENOUS | Status: DC | PRN
  Administered 2015-08-08: 12:00:00 via INTRAVENOUS

## 2015-08-08 MED ORDER — SODIUM CHLORIDE 0.9 % IV SOLN
250.0000 mL | INTRAVENOUS | Status: DC
  Administered 2015-08-08: 250 mL via INTRAVENOUS

## 2015-08-08 MED ORDER — FUROSEMIDE 40 MG PO TABS
40.0000 mg | ORAL_TABLET | Freq: Two times a day (BID) | ORAL | Status: DC
  Administered 2015-08-08 – 2015-08-11 (×7): 40 mg via ORAL
  Filled 2015-08-08 (×7): qty 1

## 2015-08-08 MED ORDER — OFF THE BEAT BOOK
Freq: Once | Status: AC
  Administered 2015-08-08: 14:00:00
  Filled 2015-08-08: qty 1

## 2015-08-08 MED ORDER — MIDAZOLAM HCL 5 MG/5ML IJ SOLN
INTRAMUSCULAR | Status: DC | PRN
  Administered 2015-08-08: 1 mg via INTRAVENOUS

## 2015-08-08 MED ORDER — AMIODARONE HCL IN DEXTROSE 360-4.14 MG/200ML-% IV SOLN
30.0000 mg/h | INTRAVENOUS | Status: DC
  Administered 2015-08-09 (×2): 30 mg/h via INTRAVENOUS
  Filled 2015-08-08 (×3): qty 200

## 2015-08-08 MED ORDER — SODIUM CHLORIDE 0.9 % IJ SOLN: 3.0000 mL | INTRAMUSCULAR | Status: DC | PRN

## 2015-08-08 NOTE — Progress Notes (Signed)
  Amiodarone Drug - Drug Interaction Consult Note  Recommendations: Watch HR d/t Coreg; watch K d/t Lasix; watch for s/s bleeding d/t Xarelto  Amiodarone is metabolized by the cytochrome P450 system and therefore has the potential to cause many drug interactions. Amiodarone has an average plasma half-life of 50 days (range 20 to 100 days).   There is potential for drug interactions to occur several weeks or months after stopping treatment and the onset of drug interactions may be slow after initiating amiodarone.   []  Statins: Increased risk of myopathy. Simvastatin- restrict dose to 20mg  daily. Other statins: counsel patients to report any muscle pain or weakness immediately.  [x]  Anticoagulants: Amiodarone can increase anticoagulant effect. Consider warfarin dose reduction. Patients should be monitored closely and the dose of anticoagulant altered accordingly, remembering that amiodarone levels take several weeks to stabilize.  []  Antiepileptics: Amiodarone can increase plasma concentration of phenytoin, the dose should be reduced. Note that small changes in phenytoin dose can result in large changes in levels. Monitor patient and counsel on signs of toxicity.  [x]  Beta blockers: increased risk of bradycardia, AV block and myocardial depression. Sotalol - avoid concomitant use.  []   Calcium channel blockers (diltiazem and verapamil): increased risk of bradycardia, AV block and myocardial depression.  []   Cyclosporine: Amiodarone increases levels of cyclosporine. Reduced dose of cyclosporine is recommended.  []  Digoxin dose should be halved when amiodarone is started.  [x]  Diuretics: increased risk of cardiotoxicity if hypokalemia occurs.  []  Oral hypoglycemic agents (glyburide, glipizide, glimepiride): increased risk of hypoglycemia. Patient's glucose levels should be monitored closely when initiating amiodarone therapy.   []  Drugs that prolong the QT interval:  Torsades de pointes risk  may be increased with concurrent use - avoid if possible.  Monitor QTc, also keep magnesium/potassium WNL if concurrent therapy can't be avoided. Marland Kitchen Antibiotics: e.g. fluoroquinolones, erythromycin. . Antiarrhythmics: e.g. quinidine, procainamide, disopyramide, sotalol. . Antipsychotics: e.g. phenothiazines, haloperidol.  . Lithium, tricyclic antidepressants, and methadone. Thank You,  James [name]  08/08/2015 6:36 PM

## 2015-08-08 NOTE — CV Procedure (Signed)
     Transesophageal Echocardiogram Note  Lowery Vlasic [id number] [birth year]  Procedure: Transesophageal Echocardiogram Indications: atrial fibrillation   Procedure Details Consent: Obtained Time Out: Verified patient identification, verified procedure, site/side was marked, verified correct patient position, special equipment/implants available, Radiology Safety Procedures followed,  medications/allergies/relevent history reviewed, required imaging and test results available.  Performed  Medications: iv fentanyl, versed and propofol administered by anesthesia staff  Left Ventrical:  Mildly dilated, LVEF 20-25%, diffuse. No thrombus.  Mitral Valve: Mild MR.  Aortic Valve: Normal, no AI.  Tricuspid Valve: Mild TR.  Pulmonic Valve: Normal.   Left Atrium/ Left atrial appendage: No thrombus, decreased filling and emptying velocities.  Atrial septum: No ASD or PFO.   Aorta: Mild AS plaque.  The patient had a difficult probe insertion and desaturated before the insertion. He appears to have obstructions in his airway and needs to be evaluated for sleep apnea.  Complications: No apparent complications Patient did tolerate procedure well.  Lars Masson, MD, [facility] 08/08/2015, 1:01 PM   Cardioversion Note  Kartier Ater [id number] 01-Feb-1956  Procedure: DC Cardioversion Indications: atrial fibrillation  Procedure Details Consent: Obtained Time Out: Verified patient identification, verified procedure, site/side was marked, verified correct patient position, special equipment/implants available, Radiology Safety Procedures followed,  medications/allergies/relevent history reviewed, required imaging and test results available.  Performed  The patient has been on adequate anticoagulation.  The patient received IV iv fentanyl, versed and propofol administered by anesthesia staff for sedation.  Synchronous cardioversion was performed at 120, 150 and 200 joules.  The  cardioversion was unsuccessful.   Complications: No apparent complications Patient did tolerate procedure well.   Lars Masson, MD, [facility] 08/08/2015, 1:01 PM

## 2015-08-08 NOTE — H&P (View-Only) (Signed)
Patient Name: James [name] Date of Encounter: 08/08/2015   SUBJECTIVE  Feeling well. No chest pain, sob or palpitations.   CURRENT MEDS . carvedilol  12.5 mg Oral BID WC  . furosemide  40 mg Intravenous BID  . potassium chloride  20 mEq Oral Daily  . rivaroxaban  20 mg Oral Q supper  . sodium chloride  3 mL Intravenous Q12H  . spironolactone  25 mg Oral Daily    OBJECTIVE  Filed Vitals:   08/06/15 1958 08/07/15 0400 08/07/15 2100 08/08/15 0500  BP: 118/73 125/77 129/79 133/95  Pulse: 95 102 115 73  Temp: 98 F (36.7 C) 98 F (36.7 C) 98.3 F (36.8 C) 98 F (36.7 C)  TempSrc: Oral  Oral Oral  Resp: Height:      Weight:  274 lb 12.8 oz (124.648 kg)  273 lb 4.8 oz (123.968 kg)  SpO2: 97% 99% 97% 100%    Intake/Output Summary (Last 24 hours) at 08/08/15 0717 Last data filed at 08/08/15 0547  Gross per 24 hour  Intake    240 ml  Output   2450 ml  Net  -2210 ml   Filed Weights   08/06/15 0534 08/07/15 0400 08/08/15 0500  Weight: 281 lb 1.6 oz (127.506 kg) 274 lb 12.8 oz (124.648 kg) 273 lb 4.8 oz (123.968 kg)    PHYSICAL EXAM  General: Pleasant obese male in NAD. Neuro: Alert and oriented X 3. Moves all extremities spontaneously. Psych: Normal affect. HEENT:  Normal  Neck: Supple without bruits or JVD. Lungs:  Resp regular and unlabored, CTA. Heart: Ir Ir  no s3, s4, or murmurs. Abdomen: Soft, non-tender, non-distended, BS + x 4.  Extremities: No clubbing, cyanosis. 1 + BL LE edema. DP/PT/Radials 2+ and equal bilaterally.  Accessory Clinical Findings  CBC  Recent Labs  08/06/15 0414 08/07/15 0542  WBC 8.7 8.9  HGB 14.9 16.5  HCT 47.5 52.2*  MCV 91.7 91.7  PLT 199 196   Basic Metabolic Panel  Recent Labs  08/06/15 0414 08/07/15 0542  NA 142 141  K 3.9 3.7  CL 106 104  CO2 29 26  GLUCOSE 108* 110*  BUN 35* 33*  CREATININE 1.69* 1.55*  CALCIUM 9.2 9.5   TELE  Afib  Echo LV EF: 20% -   25%  ------------------------------------------------------------------- Indications:   Atrial fibrillation - currently SR 427.31.  ------------------------------------------------------------------- History:  PMH: Morbid obesity. Suspected sleep apnea. Asthma.  ------------------------------------------------------------------- Study Conclusions  - Left ventricle: The cavity size was mildly dilated. Wall thickness was normal. Systolic function was severely reduced. The estimated ejection fraction was in the range of 20% to 25%. Diffuse hypokinesis. - Mitral valve: There was mild regurgitation. - Left atrium: The atrium was mildly dilated. - Right ventricle: Systolic function was mildly reduced.  Impressions:  - Severe global reduction in LV systolic function; mild LVE; mild LAE; mildly reduced RV function; mild MR.  Radiology/Studies  Nm Myocar Multi W/spect W/wall Motion / Ef  08/06/2015  CLINICAL DATA:  60 year old male with acute systolic heart failure EXAM: MYOCARDIAL IMAGING WITH SPECT (REST AND PHARMACOLOGIC-STRESS) GATED LEFT VENTRICULAR WALL MOTION STUDY LEFT VENTRICULAR EJECTION FRACTION TECHNIQUE: Standard myocardial SPECT imaging was performed after resting intravenous injection of 10 mCi Tc-53m sestamibi. Subsequently, intravenous infusion of Lexiscan was performed under the supervision of the Cardiology staff. At peak effect of the drug, 30 mCi Tc-33m sestamibi was injected intravenously and standard myocardial SPECT imaging was performed. Quantitative gated imaging  was also performed to evaluate left ventricular wall motion, and estimate left ventricular ejection fraction. COMPARISON:  Chest x-ray 08/04/2015 FINDINGS: Perfusion: Decreased activity within the inferior wall both at rest, and following pharmacological stress. This may represent diaphragmatic attenuation or the sequelae of a remote prior inferior wall infarct. No focally decreased activity following  stress to suggest a region of acute ischemia. Wall Motion: The left ventricle is dilated and there is global hypokinesis. Left Ventricular Ejection Fraction: 30 % End diastolic volume 161 ml End systolic volume 113 ml IMPRESSION: 1. Fixed inferior wall defect may be artifactual secondary to diaphragmatic attenuation or represent the sequelae of prior inferior wall infarct/scarring. 2. Dilated left ventricle with global hypokinesis. 3. Left ventricular ejection fraction 30% 4. High-risk stress test findings*. *2012 Appropriate Use Criteria for Coronary Revascularization Focused Update: J Am Coll Cardiol. 2012;59(9):857-881. http://content.dementiazones.com.aspx?articleid=1201161 Electronically Signed   By: Malachy Moan M.D.   On: 08/06/2015 10:07   Dg Chest Portable 1 View  08/04/2015  CLINICAL DATA:  Shortness of breath, atrial fibrillation EXAM: PORTABLE CHEST 1 VIEW COMPARISON:  None. FINDINGS: Diffuse bilateral interstitial thickening. Trace bilateral pleural effusions. No pneumothorax. Normal cardiomediastinal silhouette. No acute osseous abnormality. IMPRESSION: Findings most consistent with mild pulmonary edema. Electronically Signed   By: Elige Ko   On: 08/04/2015 10:40    ASSESSMENT AND PLAN   1. New onset atrial fibrillation with RVR (HCC) - With tachycardia-bradycardia syndrome. Continue Coreg 12.5mg . TEE/DCCV today @ noon. - Continue Xarelto  2.  Acute systolic congestive heart failure - Diuresed 8.2L with 20Lb weight loss. Seems close to euvolemic. Switch to po lasix today or tomorrow.  - 2 day Myoview was high risk with LV EF of 30%, ?artifactual 2nd to attenuation or prior infract/scarring. Dilated LV with global hypokinesis.  - Echo showed evere global reduction in LV systolic function; mild LVE; mild LAE; mildly reduced RV function; mild MR. - Continue medical therapy and reassess echo vs cath later - will discussed with MD.  - ACE on hold due to AKI, Creatinine is  improving.   3.  Suspected sleep apnea - He felt great on CPAP during admission. Plan to discharge on CPAP - Unfortunate he is schedule to sleep study tonight. Otherwise, no opening until Feb 2017  4. DM - New diagnosis.  Establish care with PCP. Case manger to help.   5. Morbid obesity - advise life style modification.   6. AKI - As above   Signed, Bhagat,Bhavinkumar PA-C Pager [phone]  Agree with note by Chelsea Aus PA-C  Admitted with Systolic CHF and Afib. EF 20%. BNP only 250. On Xarelto. Feels better with CPAP. Still in AFIB with RVR. I have reviewed MV imaged and there is diaphragmatic attenuation w/o signif ischemia. No cath indicated at this time. I/O neg. Good diuresis. Clinically improved. On BB. For TEE DCCV today  Runell Gess, M.D., FACP, [facility], Kathryne Eriksson [facility] [address] [place], [place]  [address]  [phone] 08/08/2015 8:34 AM

## 2015-08-08 NOTE — Interval H&P Note (Signed)
History and Physical Interval Note:  08/08/2015 11:16 AM  James [name]  has presented today for surgery, with the diagnosis of afib  The various methods of treatment have been discussed with the patient and family. After consideration of risks, benefits and other options for treatment, the patient has consented to  Procedure(s): TRANSESOPHAGEAL ECHOCARDIOGRAM (TEE) (N/A) CARDIOVERSION (N/A) as a surgical intervention .  The patient's history has been reviewed, patient examined, no change in status, stable for surgery.  I have reviewed the patient's chart and labs.  Questions were answered to the patient's satisfaction.     Lars Masson

## 2015-08-08 NOTE — Anesthesia Procedure Notes (Signed)
Procedure Name: MAC Date/Time: 08/08/2015 12:10 PM Performed by: Quentin Ore Pre-anesthesia Checklist: Patient identified, Emergency Drugs available, Suction available, Patient being monitored and Timeout performed Patient Re-evaluated:Patient Re-evaluated prior to inductionOxygen Delivery Method: Nasal cannula Intubation Type: IV induction Placement Confirmation: positive ETCO2

## 2015-08-08 NOTE — Progress Notes (Signed)
Came to assess to see if pt was ready for CPAP. Pt stated that he didn't need any help setting it up. I place sterile water in the chamber. Pt stated he can set it up, stated to pt if need any help please notify RN to let me know.

## 2015-08-08 NOTE — Care Management (Signed)
1359 08-08-15 CM did reschedule Outpatient Sleep Study for 09-18-15 @ 8:00 pm. Information placed on the AVS. No further needs at this time. Gala Lewandowsky, RN,BSN Case Manager [phone]

## 2015-08-08 NOTE — Progress Notes (Signed)
  Echocardiogram Echocardiogram Transesophageal has been performed.  James [name] 08/08/2015, 12:55 PM

## 2015-08-08 NOTE — Transfer of Care (Signed)
Immediate Anesthesia Transfer of Care Note  Patient: James [name]  Procedure(s) Performed: Procedure(s): TRANSESOPHAGEAL ECHOCARDIOGRAM (TEE) (N/A) CARDIOVERSION (N/A)  Patient Location: Endoscopy Unit  Anesthesia Type:MAC  Level of Consciousness: awake, alert  and oriented  Airway & Oxygen Therapy: Patient Spontanous Breathing and Patient connected to nasal cannula oxygen  Post-op Assessment: Report given to RN, Post -op Vital signs reviewed and stable and Patient moving all extremities  Post vital signs: Reviewed and stable  Last Vitals:  Filed Vitals:   08/08/15 1118 08/08/15 1252  BP: 104/66 126/47  Pulse: 107 98  Temp:  36.6 C  Resp: 20 13    Complications: No apparent anesthesia complications

## 2015-08-08 NOTE — Progress Notes (Signed)
 Patient Name: James [name] Date of Encounter: 08/08/2015   SUBJECTIVE  Feeling well. No chest pain, sob or palpitations.   CURRENT MEDS . carvedilol  12.5 mg Oral BID WC  . furosemide  40 mg Intravenous BID  . potassium chloride  20 mEq Oral Daily  . rivaroxaban  20 mg Oral Q supper  . sodium chloride  3 mL Intravenous Q12H  . spironolactone  25 mg Oral Daily    OBJECTIVE  Filed Vitals:   08/06/15 1958 08/07/15 0400 08/07/15 2100 08/08/15 0500  BP: 118/73 125/77 129/79 133/95  Pulse: 95 102 115 73  Temp: 98 F (36.7 C) 98 F (36.7 C) 98.3 F (36.8 C) 98 F (36.7 C)  TempSrc: Oral  Oral Oral  Resp: 16 23 18 16  Height:      Weight:  274 lb 12.8 oz (124.648 kg)  273 lb 4.8 oz (123.968 kg)  SpO2: 97% 99% 97% 100%    Intake/Output Summary (Last 24 hours) at 08/08/15 0717 Last data filed at 08/08/15 0547  Gross per 24 hour  Intake    240 ml  Output   2450 ml  Net  -2210 ml   Filed Weights   08/06/15 0534 08/07/15 0400 08/08/15 0500  Weight: 281 lb 1.6 oz (127.506 kg) 274 lb 12.8 oz (124.648 kg) 273 lb 4.8 oz (123.968 kg)    PHYSICAL EXAM  General: Pleasant obese male in NAD. Neuro: Alert and oriented X 3. Moves all extremities spontaneously. Psych: Normal affect. HEENT:  Normal  Neck: Supple without bruits or JVD. Lungs:  Resp regular and unlabored, CTA. Heart: Ir Ir  no s3, s4, or murmurs. Abdomen: Soft, non-tender, non-distended, BS + x 4.  Extremities: No clubbing, cyanosis. 1 + BL LE edema. DP/PT/Radials 2+ and equal bilaterally.  Accessory Clinical Findings  CBC  Recent Labs  08/06/15 0414 08/07/15 0542  WBC 8.7 8.9  HGB 14.9 16.5  HCT 47.5 52.2*  MCV 91.7 91.7  PLT 199 196   Basic Metabolic Panel  Recent Labs  08/06/15 0414 08/07/15 0542  NA 142 141  K 3.9 3.7  CL 106 104  CO2 29 26  GLUCOSE 108* 110*  BUN 35* 33*  CREATININE 1.69* 1.55*  CALCIUM 9.2 9.5   TELE  Afib  Echo LV EF: 20% -   25%  ------------------------------------------------------------------- Indications:   Atrial fibrillation - currently SR 427.31.  ------------------------------------------------------------------- History:  PMH: Morbid obesity. Suspected sleep apnea. Asthma.  ------------------------------------------------------------------- Study Conclusions  - Left ventricle: The cavity size was mildly dilated. Wall thickness was normal. Systolic function was severely reduced. The estimated ejection fraction was in the range of 20% to 25%. Diffuse hypokinesis. - Mitral valve: There was mild regurgitation. - Left atrium: The atrium was mildly dilated. - Right ventricle: Systolic function was mildly reduced.  Impressions:  - Severe global reduction in LV systolic function; mild LVE; mild LAE; mildly reduced RV function; mild MR.  Radiology/Studies  Nm Myocar Multi W/spect W/wall Motion / Ef  08/06/2015  CLINICAL DATA:  60-year-old male with acute systolic heart failure EXAM: MYOCARDIAL IMAGING WITH SPECT (REST AND PHARMACOLOGIC-STRESS) GATED LEFT VENTRICULAR WALL MOTION STUDY LEFT VENTRICULAR EJECTION FRACTION TECHNIQUE: Standard myocardial SPECT imaging was performed after resting intravenous injection of 10 mCi Tc-99m sestamibi. Subsequently, intravenous infusion of Lexiscan was performed under the supervision of the Cardiology staff. At peak effect of the drug, 30 mCi Tc-99m sestamibi was injected intravenously and standard myocardial SPECT imaging was performed. Quantitative gated imaging   was also performed to evaluate left ventricular wall motion, and estimate left ventricular ejection fraction. COMPARISON:  Chest x-ray 08/04/2015 FINDINGS: Perfusion: Decreased activity within the inferior wall both at rest, and following pharmacological stress. This may represent diaphragmatic attenuation or the sequelae of a remote prior inferior wall infarct. No focally decreased activity following  stress to suggest a region of acute ischemia. Wall Motion: The left ventricle is dilated and there is global hypokinesis. Left Ventricular Ejection Fraction: 30 % End diastolic volume 161 ml End systolic volume 113 ml IMPRESSION: 1. Fixed inferior wall defect may be artifactual secondary to diaphragmatic attenuation or represent the sequelae of prior inferior wall infarct/scarring. 2. Dilated left ventricle with global hypokinesis. 3. Left ventricular ejection fraction 30% 4. High-risk stress test findings*. *2012 Appropriate Use Criteria for Coronary Revascularization Focused Update: J Am Coll Cardiol. 2012;59(9):857-881. http://content.dementiazones.com.aspx?articleid=[email or web address] Electronically Signed   By: Malachy Moan M.D.   On: 08/06/2015 10:07   Dg Chest Portable 1 View  08/04/2015  CLINICAL DATA:  Shortness of breath, atrial fibrillation EXAM: PORTABLE CHEST 1 VIEW COMPARISON:  None. FINDINGS: Diffuse bilateral interstitial thickening. Trace bilateral pleural effusions. No pneumothorax. Normal cardiomediastinal silhouette. No acute osseous abnormality. IMPRESSION: Findings most consistent with mild pulmonary edema. Electronically Signed   By: Elige Ko   On: 08/04/2015 10:40    ASSESSMENT AND PLAN   1. New onset atrial fibrillation with RVR (HCC) - With tachycardia-bradycardia syndrome. Continue Coreg 12.5mg . TEE/DCCV today @ noon. - Continue Xarelto  2.  Acute systolic congestive heart failure - Diuresed 8.2L with 20Lb weight loss. Seems close to euvolemic. Switch to po lasix today or tomorrow.  - 2 day Myoview was high risk with LV EF of 30%, ?artifactual 2nd to attenuation or prior infract/scarring. Dilated LV with global hypokinesis.  - Echo showed evere global reduction in LV systolic function; mild LVE; mild LAE; mildly reduced RV function; mild MR. - Continue medical therapy and reassess echo vs cath later - will discussed with MD.  - ACE on hold due to AKI, Creatinine is  improving.   3.  Suspected sleep apnea - He felt great on CPAP during admission. Plan to discharge on CPAP - Unfortunate he is schedule to sleep study tonight. Otherwise, no opening until Feb 2017  4. DM - New diagnosis.  Establish care with PCP. Case manger to help.   5. Morbid obesity - advise life style modification.   6. AKI - As above   Signed, Bhagat,Bhavinkumar PA-C Pager [phone]  Agree with note by Chelsea Aus PA-C  Admitted with Systolic CHF and Afib. EF 20%. BNP only 250. On Xarelto. Feels better with CPAP. Still in AFIB with RVR. I have reviewed MV imaged and there is diaphragmatic attenuation w/o signif ischemia. No cath indicated at this time. I/O neg. Good diuresis. Clinically improved. On BB. For TEE DCCV today  Runell Gess, M.D., FACP, [facility], Kathryne Eriksson [facility] [address] [place], [place]  [address]  [phone] 08/08/2015 8:34 AM

## 2015-08-08 NOTE — Consult Note (Signed)
ELECTROPHYSIOLOGY CONSULT NOTE    Patient ID: James [name] MRN: [id number], DOB/AGE: [birth year] 60 y.o.  Admit date: 08/04/2015 Date of Consult: 08/08/2015  Primary Physician: No primary care provider on file. Primary Cardiologist: Hochrein - new this admission Referring MD: Tobias Alexander, MD  Reason for Consultation: atrial fibrillation  HPI:  James [name] is a 60 y.o. male with a past medical history significant for asthma and morbid obesity.  He presented to the hospital on the day of admission with worsening shortness of breath with exertion and LE edema.  He reports that he has had orthopnea for the last year.  He went to urgent care where he was found to be in AF with RVR and referred to [facility] for further evaluation.  He was found to be in acute congestive heart failure and has diuresed almost 10L this admission.  Echocardiogram demonstrated EF 20-25%, diffuse hypokinesis, mild MR, LA 55.  Myoview demonstrated EF 30% with no ischemia but fixed inferior wall defect that could be diaphragmatic attenuation. He underwent TEE cardioversion today but was unable to be cardioverted back to SR. EP has been asked to evaluate for treatment options.  He reports feeling significantly better than admission.  His shortness of breath is improved as has his LE edema.  He denies chest pain, recent fevers, chills, nausea or vomiting. He has been wearing CPAP in the hospital (formal sleep evaluation pending). He has never had palpitations.  No recent dizziness, syncope, pre-syncope.   Past Medical History  Diagnosis Date  . Asthma   . Morbid obesity (HCC)   . Suspected sleep apnea   . Former tobacco use   . New onset atrial fibrillation (HCC) 07/2015     Surgical History:  Past Surgical History  Procedure Laterality Date  . Mouth surgery       Prescriptions prior to admission  Medication Sig Dispense Refill Last Dose  . [DISCONTINUED] ePHEDrine-GuaiFENesin (BRONKAID) 25-400 MG TABS Take 1 tablet  by mouth every 6 (six) hours as needed (cough syptoms).   08/04/2015 at Unknown time    Inpatient Medications:  . carvedilol  12.5 mg Oral BID WC  . furosemide  40 mg Oral BID  . potassium chloride  20 mEq Oral Daily  . rivaroxaban  20 mg Oral Q supper  . silver sulfADIAZINE   Topical BID  . sodium chloride  3 mL Intravenous Q12H  . sodium chloride  3 mL Intravenous Q12H  . spironolactone  25 mg Oral Daily    Allergies: No Known Allergies  Social History   Social History  . Marital Status: Single    Spouse Name: N/A  . Number of Children: N/A  . Years of Education: N/A   Occupational History  . Machinist    Social History Main Topics  . Smoking status: Former Games developer  . Smokeless tobacco: Never Used     Comment: Smoked for 20 years, quit ~1995  . Alcohol Use: No  . Drug Use: No  . Sexual Activity: Not on file   Other Topics Concern  . Not on file   Social History Narrative     Family History  Problem Relation Age of Onset  . CAD Mother     CABG age 20  . CAD Maternal Aunt     CABG age 31     Review of Systems: All other systems reviewed and are otherwise negative except as noted above.  Physical Exam: Filed Vitals:   08/08/15 1300 08/08/15 1310 08/08/15  1315 08/08/15 1329  BP: 109/83 112/75 114/78 131/86  Pulse: 104 88 102 98  Temp:    98.3 F (36.8 C)  TempSrc:    Oral  Resp: 13 15 20 20   Height:      Weight:      SpO2: 96% 96% 96% 100%    GEN- The patient is obese appearing, alert and oriented x 3 today.   HEENT: normocephalic, atraumatic; sclera clear, conjunctiva pink; hearing intact; oropharynx clear; neck supple  Lungs- Clear to ausculation bilaterally, normal work of breathing.  No wheezes, rales, rhonchi Heart- Tachycardic irregular rate and rhythm  GI- soft, non-tender, non-distended, bowel sounds present  Extremities- no clubbing, cyanosis, trace to 1+ BLE edema; DP/PT/radial pulses 2+ bilaterally MS- no significant deformity or  atrophy Skin- warm and dry, no rash or lesion Psych- euthymic mood, full affect Neuro- strength and sensation are intact  Labs:   Lab Results  Component Value Date   WBC 8.3 08/08/2015   HGB 17.3* 08/08/2015   HCT 50.6 08/08/2015   MCV 88.0 08/08/2015   PLT 198 08/08/2015     Recent Labs Lab 08/08/15 0755  NA 141  K 5.9*  CL 105  CO2 23  BUN 31*  CREATININE 1.47*  CALCIUM 9.5  GLUCOSE 105*      Radiology/Studies: Nm Myocar Multi W/spect W/wall Motion / Ef 08/06/2015  CLINICAL DATA:  60 year old male with acute systolic heart failure EXAM: MYOCARDIAL IMAGING WITH SPECT (REST AND PHARMACOLOGIC-STRESS) GATED LEFT VENTRICULAR WALL MOTION STUDY LEFT VENTRICULAR EJECTION FRACTION TECHNIQUE: Standard myocardial SPECT imaging was performed after resting intravenous injection of 10 mCi Tc-64m sestamibi. Subsequently, intravenous infusion of Lexiscan was performed under the supervision of the Cardiology staff. At peak effect of the drug, 30 mCi Tc-12m sestamibi was injected intravenously and standard myocardial SPECT imaging was performed. Quantitative gated imaging was also performed to evaluate left ventricular wall motion, and estimate left ventricular ejection fraction. COMPARISON:  Chest x-ray 08/04/2015 FINDINGS: Perfusion: Decreased activity within the inferior wall both at rest, and following pharmacological stress. This may represent diaphragmatic attenuation or the sequelae of a remote prior inferior wall infarct. No focally decreased activity following stress to suggest a region of acute ischemia. Wall Motion: The left ventricle is dilated and there is global hypokinesis. Left Ventricular Ejection Fraction: 30 % End diastolic volume 161 ml End systolic volume 113 ml IMPRESSION: 1. Fixed inferior wall defect may be artifactual secondary to diaphragmatic attenuation or represent the sequelae of prior inferior wall infarct/scarring. 2. Dilated left ventricle with global hypokinesis. 3. Left  ventricular ejection fraction 30% 4. High-risk stress test findings*. *2012 Appropriate Use Criteria for Coronary Revascularization Focused Update: J Am Coll Cardiol. 2012;59(9):857-881. http://content.dementiazones.com.aspx?articleid=[email or web address] Electronically Signed   By: Malachy Moan M.D.   On: 08/06/2015 10:07   Dg Chest Portable 1 View 08/04/2015  CLINICAL DATA:  Shortness of breath, atrial fibrillation EXAM: PORTABLE CHEST 1 VIEW COMPARISON:  None. FINDINGS: Diffuse bilateral interstitial thickening. Trace bilateral pleural effusions. No pneumothorax. Normal cardiomediastinal silhouette. No acute osseous abnormality. IMPRESSION: Findings most consistent with mild pulmonary edema. Electronically Signed   By: Elige Ko   On: 08/04/2015 10:40    EKG: atrial fibrillation, QTc 484  TELEMETRY: atrial fibrillation, V rates 90-110's  Assessment/Plan: 1.  Newly diagnosed persistent atrial fibrillation The patient has previously undiagnosed persistent atrial fibrillation that I suspect he has had for several months.  His LA is enlarged at 55.  He failed DCCV off AAD therapy earlier today.  With significant LA enlargement, I think our ability to maintain SR is low. Could consider Tikosyn or Amiodarone. He would also potentially be a candidate for Genetic AF, but I think the time it would take to have him screened would be prohibitive.  With variable Cr this admission, LA enlargement, obesity, and depressed EF, I think amiodarone is the best option at this time. TSH normal this admission, Gregoire Bennis check LFT's in the morning.  I discussed amiodarone with the patient today including potential side effects. Would recommend loading with amiodarone and repeat DCCV in 2-3 days. Alternatively, could try Tikosyn and follow renal function closely and switch to amio if not effective. Orders not placed pending Dr Elberta Fortis' evaluation.  Continue Xarelto for CHADS2VASC of  2  2.  Acute systolic heart  failure/cardiomyopathy Continue medical therapy -9.7L this admission  3.  Likely OSA Recommend formal sleep study as soon as possible  4.  Morbid obesity Body mass index is 41.56 kg/(m^2). Discussed CardioFit data with the patient today. Weight loss encouraged. He seems motivated to make lifestyle changes.   Dr Elberta Fortis to see later today  Signed, Gypsy Balsam, NP 08/08/2015 2:27 PM  I have seen and examined this patient with Gypsy Balsam.  Agree with above, note added to reflect my findings.  On exam, irregular rhythm, no murmurs, lungs clear.  Presented with shortness of breath and fatigue, has since diuresed by 10L.  Was also in atrial fibrillation on presentation.  Had TEE and attempted cardioversion without conversion.  It is possible that he has a tachy induced cardiomyopathy.  At this point, would load with amiodarone and attempt cardioversion once loaded.  Tikosyn is also an option, but would prefer to simplify his medical regimen at this time.  Creatinine has also been changing, and has LA enlargement, obesity and CHF which makes tikosyn a more risky drug.  Ichelle Harral M. Casidee Jann MD 08/08/2015 4:19 PM

## 2015-08-08 NOTE — Progress Notes (Signed)
UR Completed Jonice Cerra Graves-Bigelow, RN,BSN [phone]  

## 2015-08-08 NOTE — Anesthesia Postprocedure Evaluation (Signed)
Anesthesia Post Note  Patient: Lavena Bullion  Procedure(s) Performed: Procedure(s) (LRB): TRANSESOPHAGEAL ECHOCARDIOGRAM (TEE) (N/A) CARDIOVERSION (N/A)  Patient location during evaluation: PACU Anesthesia Type: General Level of consciousness: awake and alert Pain management: pain level controlled Vital Signs Assessment: post-procedure vital signs reviewed and stable Respiratory status: spontaneous breathing, nonlabored ventilation, respiratory function stable and patient connected to nasal cannula oxygen Cardiovascular status: blood pressure returned to baseline and stable Postop Assessment: no signs of nausea or vomiting Anesthetic complications: no    Last Vitals:  Filed Vitals:   08/08/15 1315 08/08/15 1329  BP: 114/78 131/86  Pulse: 102 98  Temp:  36.8 C  Resp: 20 20    Last Pain:  Filed Vitals:   08/08/15 1330  PainSc: 0-No pain                 Jessabelle Markiewicz DAVID

## 2015-08-08 NOTE — Plan of Care (Signed)
Problem: Activity: Goal: Risk for activity intolerance will decrease Outcome: Completed/Met Date Met:  08/08/15 Pt up ad lib  Problem: Education: Goal: Knowledge of disease or condition will improve Outcome: Completed/Met Date Met:  08/08/15 Off the beat book given

## 2015-08-08 NOTE — Plan of Care (Signed)
Problem: Food- and Nutrition-Related Knowledge Deficit (NB-1.1) Goal: Nutrition education Formal process to instruct or train a patient/client in a skill or to impart knowledge to help patients/clients voluntarily manage or modify food choices and eating behavior to maintain or improve health. Outcome: Completed/Met Date Met:  08/08/15  RD consulted for nutrition education regarding diabetes.     Lab Results  Component Value Date    HGBA1C 6.9* 08/04/2015    RD provided "Carbohydrate Counting for People with Diabetes" handout from the Academy of Nutrition and Dietetics. Discussed different food groups and their effects on blood sugar, emphasizing carbohydrate-containing foods. Provided list of carbohydrates and recommended serving sizes of common foods.  Discussed importance of controlled and consistent carbohydrate intake throughout the day. Provided examples of ways to balance meals/snacks and encouraged intake of high-fiber, whole grain complex carbohydrates. Teach back method used.  Expect good compliance.  Body mass index is 41.56 kg/(m^2). Pt meets criteria for class 3, extreme/morbid obesity based on current BMI.  Current diet order is heart healthy, patient is consuming approximately 100% of meals at this time. Labs and medications reviewed. No further nutrition interventions warranted at this time. RD contact information provided. If additional nutrition issues arise, please re-consult RD.  Molli Barrows, RD, LDN, [place] Pager [phone] After Hours Pager [phone]

## 2015-08-08 NOTE — Anesthesia Preprocedure Evaluation (Addendum)
Anesthesia Evaluation  Patient identified by MRN, date of birth, ID band Patient awake    Reviewed: Allergy & Precautions, NPO status , Patient's Chart, lab work & pertinent test results  Airway Mallampati: II  TM Distance: >3 FB Neck ROM: Full    Dental  (+) Teeth Intact   Pulmonary asthma , former smoker,    Pulmonary exam normal        Cardiovascular +CHF  Normal cardiovascular exam+ dysrhythmias Atrial Fibrillation      Neuro/Psych    GI/Hepatic   Endo/Other  diabetes, Type 2, Oral Hypoglycemic Agents  Renal/GU      Musculoskeletal   Abdominal   Peds  Hematology   Anesthesia Other Findings   Reproductive/Obstetrics                            Anesthesia Physical Anesthesia Plan  ASA: III  Anesthesia Plan: MAC   Post-op Pain Management:    Induction: Intravenous  Airway Management Planned: Nasal Cannula  Additional Equipment:   Intra-op Plan:   Post-operative Plan:   Informed Consent: I have reviewed the patients History and Physical, chart, labs and discussed the procedure including the risks, benefits and alternatives for the proposed anesthesia with the patient or authorized representative who has indicated his/her understanding and acceptance.   Dental advisory given  Plan Discussed with: CRNA and Surgeon  Anesthesia Plan Comments:        Anesthesia Quick Evaluation

## 2015-08-09 ENCOUNTER — Encounter (HOSPITAL_COMMUNITY): Payer: Self-pay | Admitting: Cardiology

## 2015-08-09 LAB — CBC
HEMATOCRIT: 50.4 % (ref 39.0–52.0)
HEMOGLOBIN: 16.4 g/dL (ref 13.0–17.0)
MCH: 29.6 pg (ref 26.0–34.0)
MCHC: 32.5 g/dL (ref 30.0–36.0)
MCV: 91 fL (ref 78.0–100.0)
Platelets: 205 10*3/uL (ref 150–400)
RBC: 5.54 MIL/uL (ref 4.22–5.81)
RDW: 15 % (ref 11.5–15.5)
WBC: 8.5 10*3/uL (ref 4.0–10.5)

## 2015-08-09 LAB — HEPATIC FUNCTION PANEL
ALT: 37 U/L (ref 17–63)
AST: 23 U/L (ref 15–41)
Albumin: 4 g/dL (ref 3.5–5.0)
Alkaline Phosphatase: 53 U/L (ref 38–126)
BILIRUBIN INDIRECT: 0.6 mg/dL (ref 0.3–0.9)
Bilirubin, Direct: 0.2 mg/dL (ref 0.1–0.5)
TOTAL PROTEIN: 7.1 g/dL (ref 6.5–8.1)
Total Bilirubin: 0.8 mg/dL (ref 0.3–1.2)

## 2015-08-09 LAB — BASIC METABOLIC PANEL
Anion gap: 13 (ref 5–15)
BUN: 27 mg/dL — AB (ref 6–20)
CALCIUM: 9.2 mg/dL (ref 8.9–10.3)
CHLORIDE: 101 mmol/L (ref 101–111)
CO2: 27 mmol/L (ref 22–32)
CREATININE: 1.53 mg/dL — AB (ref 0.61–1.24)
GFR calc Af Amer: 57 mL/min — ABNORMAL LOW (ref >60)
GFR calc non Af Amer: 49 mL/min — ABNORMAL LOW (ref >60)
Glucose, Bld: 125 mg/dL — ABNORMAL HIGH (ref 65–99)
Potassium: 3.8 mmol/L (ref 3.5–5.1)
Sodium: 141 mmol/L (ref 135–145)

## 2015-08-09 MED ORDER — ATORVASTATIN CALCIUM 40 MG PO TABS
40.0000 mg | ORAL_TABLET | Freq: Every day | ORAL | Status: DC
  Administered 2015-08-09 – 2015-08-12 (×4): 40 mg via ORAL
  Filled 2015-08-09 (×4): qty 1

## 2015-08-09 NOTE — Plan of Care (Signed)
Problem: Education: Goal: Understanding of medication regimen will improve Outcome: Completed/Met Date Met:  08/09/15 Pt understands the importance of antiarrhythmics.

## 2015-08-09 NOTE — Progress Notes (Signed)
Patient Name: James [name] Date of Encounter: 08/09/2015   SUBJECTIVE  Feeling well. No chest pain, sob or palpitations. Failed DCCV.   CURRENT MEDS . carvedilol  12.5 mg Oral BID WC  . furosemide  40 mg Oral BID  . potassium chloride  20 mEq Oral Daily  . rivaroxaban  20 mg Oral Q supper  . silver sulfADIAZINE   Topical BID  . sodium chloride  3 mL Intravenous Q12H  . sodium chloride  3 mL Intravenous Q12H  . spironolactone  25 mg Oral Daily    OBJECTIVE  Filed Vitals:   08/08/15 2034 08/09/15 0003 08/09/15 0500 08/09/15 0850  BP: 115/85 99/81 124/77 146/82  Pulse: 58 75 92 90  Temp: 97.4 F (36.3 C) 97.9 F (36.6 C) 97.5 F (36.4 C) 98 F (36.7 C)  TempSrc: Oral Oral Oral Oral  Resp: 18 20 16 18   Height:      Weight:   272 lb 8 oz (123.605 kg)   SpO2: 99% 97% 98% 99%    Intake/Output Summary (Last 24 hours) at 08/09/15 0903 Last data filed at 08/09/15 0740  Gross per 24 hour  Intake 1440.02 ml  Output   3300 ml  Net -1859.98 ml   Filed Weights   08/07/15 0400 08/08/15 0500 08/09/15 0500  Weight: 274 lb 12.8 oz (124.648 kg) 273 lb 4.8 oz (123.968 kg) 272 lb 8 oz (123.605 kg)    PHYSICAL EXAM  General: Pleasant obese male in NAD. Neuro: Alert and oriented X 3. Moves all extremities spontaneously. Psych: Normal affect. HEENT:  Normal  Neck: Supple without bruits or JVD. Lungs:  Resp regular and unlabored, CTA. Heart: Ir Ir  no s3, s4, or murmurs. Abdomen: Soft, non-tender, non-distended, BS + x 4.  Extremities: No clubbing, cyanosis or edema. DP/PT/Radials 2+ and equal bilaterally.  Accessory Clinical Findings  CBC  Recent Labs  08/08/15 0755 08/09/15 0342  WBC 8.3 8.5  HGB 17.3* 16.4  HCT 50.6 50.4  MCV 88.0 91.0  PLT 198 205   Basic Metabolic Panel  Recent Labs  08/08/15 0755 08/09/15 0342  NA 141 141  K 5.9* 3.8  CL 105 101  CO2 23 27  GLUCOSE 105* 125*  BUN 31* 27*  CREATININE 1.47* 1.53*  CALCIUM 9.5 9.2    TELE  Afib  Echo LV EF: 20% -  25%  ------------------------------------------------------------------- Indications:   Atrial fibrillation - currently SR 427.31.  ------------------------------------------------------------------- History:  PMH: Morbid obesity. Suspected sleep apnea. Asthma.  ------------------------------------------------------------------- Study Conclusions  - Left ventricle: The cavity size was mildly dilated. Wall thickness was normal. Systolic function was severely reduced. The estimated ejection fraction was in the range of 20% to 25%. Diffuse hypokinesis. - Mitral valve: There was mild regurgitation. - Left atrium: The atrium was mildly dilated. - Right ventricle: Systolic function was mildly reduced.  Impressions:  - Severe global reduction in LV systolic function; mild LVE; mild LAE; mildly reduced RV function; mild MR.  Procedure: Transesophageal Echocardiogram Left Ventrical: Mildly dilated, LVEF 20-25%, diffuse. No thrombus.  Mitral Valve: Mild MR.  Aortic Valve: Normal, no AI.  Tricuspid Valve: Mild TR.  Pulmonic Valve: Normal.   Left Atrium/ Left atrial appendage: No thrombus, decreased filling and emptying velocities.  Atrial septum: No ASD or PFO.   Aorta: Mild AS plaque.  The patient had a difficult probe insertion and desaturated before the insertion. He appears to have obstructions in his airway and needs to be evaluated for sleep  apnea.  Procedure: DC Cardioversion The patient has been on adequate anticoagulation. The patient received IV iv fentanyl, versed and propofol administered by anesthesia staff for sedation. Synchronous cardioversion was performed at 120, 150 and 200 joules.  The cardioversion was unsuccessful. Radiology/Studies  Nm Myocar Multi W/spect W/wall Motion / Ef  08/06/2015  CLINICAL DATA:  60 year old male with acute systolic heart failure EXAM: MYOCARDIAL IMAGING WITH SPECT (REST AND  PHARMACOLOGIC-STRESS) GATED LEFT VENTRICULAR WALL MOTION STUDY LEFT VENTRICULAR EJECTION FRACTION TECHNIQUE: Standard myocardial SPECT imaging was performed after resting intravenous injection of 10 mCi Tc-19m sestamibi. Subsequently, intravenous infusion of Lexiscan was performed under the supervision of the Cardiology staff. At peak effect of the drug, 30 mCi Tc-23m sestamibi was injected intravenously and standard myocardial SPECT imaging was performed. Quantitative gated imaging was also performed to evaluate left ventricular wall motion, and estimate left ventricular ejection fraction. COMPARISON:  Chest x-ray 08/04/2015 FINDINGS: Perfusion: Decreased activity within the inferior wall both at rest, and following pharmacological stress. This may represent diaphragmatic attenuation or the sequelae of a remote prior inferior wall infarct. No focally decreased activity following stress to suggest a region of acute ischemia. Wall Motion: The left ventricle is dilated and there is global hypokinesis. Left Ventricular Ejection Fraction: 30 % End diastolic volume 161 ml End systolic volume 113 ml IMPRESSION: 1. Fixed inferior wall defect may be artifactual secondary to diaphragmatic attenuation or represent the sequelae of prior inferior wall infarct/scarring. 2. Dilated left ventricle with global hypokinesis. 3. Left ventricular ejection fraction 30% 4. High-risk stress test findings*. *2012 Appropriate Use Criteria for Coronary Revascularization Focused Update: J Am Coll Cardiol. 2012;59(9):857-881. http://content.dementiazones.com.aspx?articleid=[email or web address] Electronically Signed   By: Malachy Moan M.D.   On: 08/06/2015 10:07   Dg Chest Portable 1 View  08/04/2015  CLINICAL DATA:  Shortness of breath, atrial fibrillation EXAM: PORTABLE CHEST 1 VIEW COMPARISON:  None. FINDINGS: Diffuse bilateral interstitial thickening. Trace bilateral pleural effusions. No pneumothorax. Normal cardiomediastinal silhouette. No  acute osseous abnormality. IMPRESSION: Findings most consistent with mild pulmonary edema. Electronically Signed   By: Elige Ko   On: 08/04/2015 10:40    ASSESSMENT AND PLAN   1. New onset atrial fibrillation with RVR (HCC) - With tachycardia-bradycardia syndrome. Continue Coreg 12.5mg . TEE showed LVEF of 20-25%, no thrombus. unsuccessful DCCV x 3. Seen by EP and started on Amio load. Plan for for repeat DCCV in 2-3 days.  - Appreciates pharmacy recommendation of drug interaction.  - Continue Xarelto  2.  Acute systolic congestive heart failure - Diuresed 10.7L with 21Lb weight loss. Appears euvolemic.  - 2 day Myoview was high risk with LV EF of 30%, ?artifactual 2nd to attenuation or prior infract/scarring. Dilated LV with global hypokinesis.  - Echo showed evere global reduction in LV systolic function; mild LVE; mild LAE; mildly reduced RV function; mild MR. - Continue medical therapy, no plan for cath currently.  - ACE on hold due to AKI, Creatinine is stable today.   3.  Suspected sleep apnea - He felt great on CPAP during admission. Plan to discharge on CPAP, Case manger will reschedule sleep study.  - During TEE - The patient had a difficult probe insertion and desaturated before the insertion. He appears to have obstructions in his airway and needs to be evaluated for sleep apnea.  4. DM - New diagnosis.  Establish care with PCP. Case manger to help.   5. Morbid obesity - advise life style modification.   6. AKI - As above  Signed, Manson Passey PA-C Pager [phone]  Agree with note by Chelsea Aus PA-C  Failed attempt at TEE DCCV. On NOAC. HR 90-100. Good diuresis. On Amio drip (load--->> infusion per EP). Plan re attempt at TEE DCCV after fully loaded with Amio/ AAD. If he fails to convert and hold then will opt for rate control.    Runell Gess, M.D., FACP, [facility], [facility] [address] [place], [place]  [address]  [phone] 08/09/2015 10:18 AM

## 2015-08-09 NOTE — Progress Notes (Signed)
Inpatient Diabetes Program Recommendations  AACE/ADA: New Consensus Statement on Inpatient Glycemic Control (2015)  Target Ranges:  Prepandial:   less than 140 mg/dL      Peak postprandial:   less than 180 mg/dL (1-2 hours)      Critically ill patients:  140 - 180 mg/dL   MD, please add New diagnosis of Diabetes to the problem list so patient can be signed up for outpatient education as he requested.  Spoke with patient about new diabetes diagnosis.  Discussed A1C results (6.9%) and explained what an A1C is and basic pathophysiology of DM Type 2, basic home care, importance of maintaining good CBG control to prevent long-term and short-term complications. Patient has Living Well with diabetes booklet at bedside and he was encouraged to read through entire book. Discussed importance of Diet and Exercise (when cleared by Cardiology) on glucose control.  Patient wants to go to Outpatient Education and will be ok to come to [facility] for this. Patient verbalized understanding of information discussed and he states that he has no further questions at this time related to diabetes. RNs to provide ongoing basic DM education at bedside with this patient.   Thanks, Christena Deem RN, MSN, [facility] Inpatient Diabetes Coordinator Team Pager [phone] (8a-5p)

## 2015-08-09 NOTE — Progress Notes (Signed)
Inpatient Diabetes Program Recommendations  AACE/ADA: New Consensus Statement on Inpatient Glycemic Control (2015)  Target Ranges:  Prepandial:   less than 140 mg/dL      Peak postprandial:   less than 180 mg/dL (1-2 hours)      Critically ill patients:  140 - 180 mg/dL   Review of Glycemic Control  MD - Consider outpatient education order for new diagnosis of DM.  A1c at 6.9 %.   RN please emphasize lifestyle modifications at time of discharge: Teach patient survival skills before discharge: A1c results, goal is 7% or less (150 avg glucose) Patient already there Watching carb intake (Males 60-75 g/meal 15-30 g/snack), plate method, watching beverage options.  Thanks,  Christena Deem RN, MSN, [facility] Inpatient Diabetes Coordinator Team Pager [phone] (8a-5p)

## 2015-08-09 NOTE — Progress Notes (Signed)
08/05/2015: Cholesterol 177; HDL 35*; LDL Cholesterol 121*; Triglycerides 105; VLDL 21  - LFT normal. Given afib and cardiomyopathy. Will start low dose lipitor. Recheck LFT in 4-6 weeks.   Pharmacy to decide timing of 12gm load dose of amio.   Nataleah Scioneaux, PAC

## 2015-08-09 NOTE — Progress Notes (Signed)
Pharmacy note Re: amiodarone loading dose  Asked to calculate when patient will have received a total of Amiodarone 12 grams. Amiodarone IV begun 1/9 ~6:30 pm.  No loading dose. Received 60 mg/hr x 6 hrs, and currently on 30 mg/hr.  This rate provides 720 mg per day, so it will be ~16 days until a total of 12 gm has been reached = 08/24/15.     Will follow up EP plans regarding repeat DCCV: in several weeks?  Follow up for change to oral Amiodarone at some point.  Dennie Fetters, RPh Pager: [phone] 08/09/2015,12:38 PM

## 2015-08-10 DIAGNOSIS — E119 Type 2 diabetes mellitus without complications: Secondary | ICD-10-CM

## 2015-08-10 DIAGNOSIS — I5041 Acute combined systolic (congestive) and diastolic (congestive) heart failure: Secondary | ICD-10-CM

## 2015-08-10 LAB — BASIC METABOLIC PANEL
ANION GAP: 13 (ref 5–15)
BUN: 26 mg/dL — AB (ref 6–20)
CHLORIDE: 100 mmol/L — AB (ref 101–111)
CO2: 26 mmol/L (ref 22–32)
Calcium: 9.4 mg/dL (ref 8.9–10.3)
Creatinine, Ser: 1.51 mg/dL — ABNORMAL HIGH (ref 0.61–1.24)
GFR calc Af Amer: 57 mL/min — ABNORMAL LOW (ref >60)
GFR calc non Af Amer: 50 mL/min — ABNORMAL LOW (ref >60)
Glucose, Bld: 127 mg/dL — ABNORMAL HIGH (ref 65–99)
POTASSIUM: 3.8 mmol/L (ref 3.5–5.1)
Sodium: 139 mmol/L (ref 135–145)

## 2015-08-10 LAB — CBC
HEMATOCRIT: 51.7 % (ref 39.0–52.0)
Hemoglobin: 16.8 g/dL (ref 13.0–17.0)
MCH: 29.8 pg (ref 26.0–34.0)
MCHC: 32.5 g/dL (ref 30.0–36.0)
MCV: 91.7 fL (ref 78.0–100.0)
PLATELETS: 186 10*3/uL (ref 150–400)
RBC: 5.64 MIL/uL (ref 4.22–5.81)
RDW: 14.7 % (ref 11.5–15.5)
WBC: 9.3 10*3/uL (ref 4.0–10.5)

## 2015-08-10 MED ORDER — AMIODARONE HCL 200 MG PO TABS
400.0000 mg | ORAL_TABLET | Freq: Two times a day (BID) | ORAL | Status: DC
  Administered 2015-08-10 – 2015-08-12 (×5): 400 mg via ORAL
  Filled 2015-08-10 (×5): qty 2

## 2015-08-10 MED ORDER — SODIUM CHLORIDE 0.9 % IJ SOLN: 3.0000 mL | INTRAMUSCULAR | Status: DC | PRN

## 2015-08-10 MED ORDER — SODIUM CHLORIDE 0.9 % IV SOLN
250.0000 mL | INTRAVENOUS | Status: DC
Start: 2015-08-10 — End: 2015-08-12

## 2015-08-10 MED ORDER — SODIUM CHLORIDE 0.9 % IJ SOLN
3.0000 mL | Freq: Two times a day (BID) | INTRAMUSCULAR | Status: DC
Start: 2015-08-10 — End: 2015-08-12
  Administered 2015-08-10 (×2): 3 mL via INTRAVENOUS

## 2015-08-10 NOTE — Progress Notes (Signed)
     DCCV planned for Friday 08/12/15 at 10am. NPO after midnight the night before.   Cline Crock PA-C  [facility]

## 2015-08-10 NOTE — Progress Notes (Signed)
Patient Name: James [name] Date of Encounter: 08/10/2015   SUBJECTIVE  Feeling well. No chest pain, sob or palpitations.   CURRENT MEDS . amiodarone  400 mg Oral BID  . atorvastatin  40 mg Oral q1800  . carvedilol  12.5 mg Oral BID WC  . furosemide  40 mg Oral BID  . potassium chloride  20 mEq Oral Daily  . rivaroxaban  20 mg Oral Q supper  . silver sulfADIAZINE   Topical BID  . sodium chloride  3 mL Intravenous Q12H  . sodium chloride  3 mL Intravenous Q12H  . spironolactone  25 mg Oral Daily    OBJECTIVE  Filed Vitals:   08/09/15 1629 08/09/15 1942 08/10/15 0348 08/10/15 0809  BP: 125/70 116/66 125/83 122/86  Pulse: 84 77 74 69  Temp: 98.4 F (36.9 C) 98.4 F (36.9 C) 97.4 F (36.3 C)   TempSrc: Oral Oral Oral   Resp: 18 17 16    Height:      Weight:   273 lb 4.8 oz (123.968 kg)   SpO2: 99% 98% 100%     Intake/Output Summary (Last 24 hours) at 08/10/15 1021 Last data filed at 08/10/15 0900  Gross per 24 hour  Intake  853.6 ml  Output    800 ml  Net   53.6 ml   Filed Weights   08/08/15 0500 08/09/15 0500 08/10/15 0348  Weight: 273 lb 4.8 oz (123.968 kg) 272 lb 8 oz (123.605 kg) 273 lb 4.8 oz (123.968 kg)    PHYSICAL EXAM  General: Pleasant obese male in NAD. Neuro: Alert and oriented X 3. Moves all extremities spontaneously. Psych: Normal affect. HEENT:  Normal  Neck: Supple without bruits or JVD. Lungs:  Resp regular and unlabored, CTA. Heart: Ir Ir  no s3, s4, or murmurs. Abdomen: Soft, non-tender, non-distended, BS + x 4.  Extremities: No clubbing, cyanosis or edema. DP/PT/Radials 2+ and equal bilaterally.  Accessory Clinical Findings  CBC  Recent Labs  08/09/15 0342 08/10/15 0513  WBC 8.5 9.3  HGB 16.4 16.8  HCT 50.4 51.7  MCV 91.0 91.7  PLT 205 186   Basic Metabolic Panel  Recent Labs  08/09/15 0342 08/10/15 0513  NA 141 139  K 3.8 3.8  CL 101 100*  CO2 27 26  GLUCOSE 125* 127*  BUN 27* 26*  CREATININE 1.53* 1.51*    CALCIUM 9.2 9.4   TELE  Afib  Echo LV EF: 20% -  25%  ------------------------------------------------------------------- Indications:   Atrial fibrillation - currently SR 427.31.  ------------------------------------------------------------------- History:  PMH: Morbid obesity. Suspected sleep apnea. Asthma.  ------------------------------------------------------------------- Study Conclusions  - Left ventricle: The cavity size was mildly dilated. Wall thickness was normal. Systolic function was severely reduced. The estimated ejection fraction was in the range of 20% to 25%. Diffuse hypokinesis. - Mitral valve: There was mild regurgitation. - Left atrium: The atrium was mildly dilated. - Right ventricle: Systolic function was mildly reduced.  Impressions:  - Severe global reduction in LV systolic function; mild LVE; mild LAE; mildly reduced RV function; mild MR.  Procedure: Transesophageal Echocardiogram Left Ventrical: Mildly dilated, LVEF 20-25%, diffuse. No thrombus.  Mitral Valve: Mild MR.  Aortic Valve: Normal, no AI.  Tricuspid Valve: Mild TR.  Pulmonic Valve: Normal.   Left Atrium/ Left atrial appendage: No thrombus, decreased filling and emptying velocities.  Atrial septum: No ASD or PFO.   Aorta: Mild AS plaque.  The patient had a difficult probe insertion and desaturated before the  insertion. He appears to have obstructions in his airway and needs to be evaluated for sleep apnea.  Procedure: DC Cardioversion The patient has been on adequate anticoagulation. The patient received IV iv fentanyl, versed and propofol administered by anesthesia staff for sedation. Synchronous cardioversion was performed at 120, 150 and 200 joules.  The cardioversion was unsuccessful. Radiology/Studies  Nm Myocar Multi W/spect W/wall Motion / Ef  08/06/2015  CLINICAL DATA:  60 year old male with acute systolic heart failure EXAM: MYOCARDIAL IMAGING  WITH SPECT (REST AND PHARMACOLOGIC-STRESS) GATED LEFT VENTRICULAR WALL MOTION STUDY LEFT VENTRICULAR EJECTION FRACTION TECHNIQUE: Standard myocardial SPECT imaging was performed after resting intravenous injection of 10 mCi Tc-59m sestamibi. Subsequently, intravenous infusion of Lexiscan was performed under the supervision of the Cardiology staff. At peak effect of the drug, 30 mCi Tc-42m sestamibi was injected intravenously and standard myocardial SPECT imaging was performed. Quantitative gated imaging was also performed to evaluate left ventricular wall motion, and estimate left ventricular ejection fraction. COMPARISON:  Chest x-ray 08/04/2015 FINDINGS: Perfusion: Decreased activity within the inferior wall both at rest, and following pharmacological stress. This may represent diaphragmatic attenuation or the sequelae of a remote prior inferior wall infarct. No focally decreased activity following stress to suggest a region of acute ischemia. Wall Motion: The left ventricle is dilated and there is global hypokinesis. Left Ventricular Ejection Fraction: 30 % End diastolic volume 161 ml End systolic volume 113 ml IMPRESSION: 1. Fixed inferior wall defect may be artifactual secondary to diaphragmatic attenuation or represent the sequelae of prior inferior wall infarct/scarring. 2. Dilated left ventricle with global hypokinesis. 3. Left ventricular ejection fraction 30% 4. High-risk stress test findings*. *2012 Appropriate Use Criteria for Coronary Revascularization Focused Update: J Am Coll Cardiol. 2012;59(9):857-881. http://content.dementiazones.com.aspx?articleid=[email or web address] Electronically Signed   By: Malachy Moan M.D.   On: 08/06/2015 10:07   Dg Chest Portable 1 View  08/04/2015  CLINICAL DATA:  Shortness of breath, atrial fibrillation EXAM: PORTABLE CHEST 1 VIEW COMPARISON:  None. FINDINGS: Diffuse bilateral interstitial thickening. Trace bilateral pleural effusions. No pneumothorax. Normal  cardiomediastinal silhouette. No acute osseous abnormality. IMPRESSION: Findings most consistent with mild pulmonary edema. Electronically Signed   By: Elige Ko   On: 08/04/2015 10:40    ASSESSMENT AND PLAN   1. New onset atrial fibrillation with RVR (HCC) - With tachycardia-bradycardia syndrome. Continue Coreg 12.5mg  adn xarelto. TEE showed LVEF of 20-25%, no thrombus. unsuccessful DCCV x 3. Seen by EP and started on Amio load.  - Pharmacy calculation of total of Amiodarone 12 grams--> target date would be 08/24/15 - Will start Po amiodarone 40mg  BID, DC IV amio and plan for DCCV Friday @ 10am ([address] [place], [place])  2.  Acute systolic congestive heart failure - Diuresed 10.4L with 20Lb weight loss. Appears euvolemic.  - 2 day Myoview was high risk with LV EF of 30%, ?artifactual 2nd to attenuation or prior infract/scarring. Dilated LV with global hypokinesis.  - Echo showed evere global reduction in LV systolic function; mild LVE; mild LAE; mildly reduced RV function; mild MR. - Continue medical therapy, no plan for cath currently.  - ACE on hold due to AKI, Creatinine minimally improved.   3.  Suspected sleep apnea - He felt great on CPAP during admission. Plan to discharge on CPAP, Case manger will reschedule sleep study.  - During TEE - The patient had a difficult probe insertion and desaturated before the insertion. He appears to have obstructions in his airway and needs to be evaluated for sleep apnea.  4. DM - New diagnosis.  Establish care with PCP. Case manger to help.   5. Morbid obesity - advise life style modification.   6. AKI - As above  Signed, Bhagat,Bhavinkumar PA-C   Agree with note by Chelsea Aus PA-C  Failed attempt at TEE DCCV. On NOAC. HR 90-100. Good diuresis. On Amio drip (load--->> infusion per EP). Plan re attempt at TEE DCCV after fully loaded with Amio/ AAD. If he fails to convert and hold then will opt for rate control.    Runell Gess, M.D.,  FACP, [facility], [place] [address] [place], [place]  [address]  [phone] 08/10/2015 10:21 AM  Agree with note by Chelsea Aus PA-C  Continues in AFIB with VR 100. On Xarelto. IV---> PO Amio load. Plan TEE DCCV Fri  Runell Gess, M.D., FACP, [facility], [place] [facility] [address] [place], [place]  [address]  [phone] 08/10/2015 10:36 AM

## 2015-08-10 NOTE — Progress Notes (Signed)
ANTICOAGULATION CONSULT NOTE   Pharmacy Consult for Xarelto Indication: atrial fibrillation  No Known Allergies  Patient Measurements: Height: 5\' 8"  (172.7 cm) Weight: 273 lb 4.8 oz (123.968 kg) IBW/kg (Calculated) : 68.4 Heparin Dosing Weight: 99.7kg  Vital Signs: Temp: 97.4 F (36.3 C) (01/11 0348) Temp Source: Oral (01/11 0348) BP: 122/86 mmHg (01/11 0809) Pulse Rate: 69 (01/11 0809)  Labs:  Recent Labs  08/08/15 0755 08/09/15 0342 08/10/15 0513  HGB 17.3* 16.4 16.8  HCT 50.6 50.4 51.7  PLT 198 205 186  CREATININE 1.47* 1.53* 1.51*    Estimated Creatinine Clearance: 69.2 mL/min (by C-G formula based on Cr of 1.51).   Medical History: Past Medical History  Diagnosis Date  . Asthma   . Morbid obesity (HCC)   . Suspected sleep apnea   . Former tobacco use   . New onset atrial fibrillation (HCC) 07/2015  . Hypertension   . Non-ischemic cardiomyopathy (HCC)     Assessment: 59 yom with SOB. Pharmacy consulted to dose heparin then transitioned to Xarelto for afib. No anticoag documented pta. CBC wnl, no bleed documented.  Scr 1.51, CrCl ~ 65 ml/min > Xarelto 20 mg daily appropriate, CBC stable.  Plan: 1. Continue Xarelto 20 mg daily. 2. F/u plans for cardioversion. 3. Patient education completed 1/6.  Pharmacy will sign-off but are available as needed  Isaac Bliss, PharmD, BCPS, [facility] Clinical Pharmacist Pager [phone] 08/10/2015 10:03 AM

## 2015-08-11 LAB — CBC
HCT: 49.7 % (ref 39.0–52.0)
HEMOGLOBIN: 16.2 g/dL (ref 13.0–17.0)
MCH: 29.7 pg (ref 26.0–34.0)
MCHC: 32.6 g/dL (ref 30.0–36.0)
MCV: 91 fL (ref 78.0–100.0)
Platelets: 186 10*3/uL (ref 150–400)
RBC: 5.46 MIL/uL (ref 4.22–5.81)
RDW: 14.6 % (ref 11.5–15.5)
WBC: 9.7 10*3/uL (ref 4.0–10.5)

## 2015-08-11 LAB — BASIC METABOLIC PANEL
ANION GAP: 13 (ref 5–15)
BUN: 23 mg/dL — AB (ref 6–20)
CHLORIDE: 104 mmol/L (ref 101–111)
CO2: 24 mmol/L (ref 22–32)
Calcium: 9.4 mg/dL (ref 8.9–10.3)
Creatinine, Ser: 1.65 mg/dL — ABNORMAL HIGH (ref 0.61–1.24)
GFR calc Af Amer: 52 mL/min — ABNORMAL LOW (ref >60)
GFR, EST NON AFRICAN AMERICAN: 45 mL/min — AB (ref >60)
GLUCOSE: 115 mg/dL — AB (ref 65–99)
POTASSIUM: 4.1 mmol/L (ref 3.5–5.1)
SODIUM: 141 mmol/L (ref 135–145)

## 2015-08-11 NOTE — Care Management (Signed)
Case Management Note Initial Note Stated by Donn Pierini RN, BSN Unit 2W-Case Manager [phone] Covering 3W  Patient Details  Name: James [name] MRN: [id number] Date of Birth: Mar 30, 1956  Subjective/Objective: Pt admitted with new onset afib, pulm. edema    Action/Plan: PTA pt lived at home, independent- anticipate return home when medically stable. Referral received for Xarelto benefits check- tried to submit request- but insurance states speciality med. Not covered?- spoke with pt at bedside- who states that he just started new plan with [facility] Jan. 1. And reports that plan has medication coverage. Explained to pt insurance benefit request and what they state to [place]- pt to call # on insurance card himself to request if Xarelto is covered and what his copay would be with his new plan. Gave pt both the 30 day free card and the copay assist card for Xarelto. Pt verbalized understanding that Xarelto copay cost may be high for him and understands to discuss with MD if this is the case on what an alternate medication may be.   Expected Discharge Date:     Expected Discharge Plan: Home/Self Care  In-House Referral:    Discharge planning Services CM Consult, Medication Assistance  Post Acute Care Choice:   Choice offered to:    DME Arranged:N/A   DME Agency:  N/A  [facility] Arranged:N/A HH Agency: N/A   Status of Service:Completed  Medicare Important Message Given:   Date Medicare IM Given:   Medicare IM give by:   Date Additional Medicare IM Given:   Additional Medicare Important Message give by:    If discussed at Long Length of Stay Meetings, dates discussed:   Additional Comments: 1059 08-01-15 Tomi Bamberger, RN,BSN [phone] CM did speak with pt in regards to disposition needs. CM did make pt aware of the [facility] Number and pt to call for PCP needs. No further needs from CM at this time.   08/05/15- 1315- Donn Pierini RN, CM- referral for CPAP at home- also discussed with PA regarding sleep study who states [facility] has [facility] for [facility]- they will call pt with appointment date, per PA- cpap settings are to be what they were here in hospital for now. Order placed for CPAP- spoke with Jermaine at [facility] regarding CPAP needs for home- he will f/u with pt prior to discharge.

## 2015-08-11 NOTE — Progress Notes (Signed)
UR Completed Tyler Robidoux Graves-Bigelow, RN,BSN [phone]  

## 2015-08-11 NOTE — Progress Notes (Signed)
Subjective:  No CP/SOB. Remains in Afib with VR 100 on Xarelto  Objective:  Temp:  [97.7 F (36.5 C)-98.3 F (36.8 C)] 97.8 F (36.6 C) (01/12 0843) Pulse Rate:  [88-96] 96 (01/12 0843) Resp:  [18-19] 18 (01/12 0843) BP: (100-133)/(73-96) 100/73 mmHg (01/12 0843) SpO2:  [97 %-100 %] 97 % (01/12 0843) Weight:  [271 lb 8 oz (123.152 kg)] 271 lb 8 oz (123.152 kg) (01/12 0532) Weight change: -1 lb 12.8 oz (-0.816 kg)  Intake/Output from previous day: 01/11 0701 - 01/12 0700 In: 1043 [P.O.:1040; I.V.:3] Out: 1850 [Urine:1850]  Intake/Output from this shift: Total I/O In: 360 [P.O.:360] Out: 125 [Urine:125]  Physical Exam: General appearance: alert and no distress Neck: no adenopathy, no carotid bruit, no JVD, supple, symmetrical, trachea midline and thyroid not enlarged, symmetric, no tenderness/mass/nodules Lungs: clear to auscultation bilaterally Heart: irregularly irregular rhythm Extremities: extremities normal, atraumatic, no cyanosis or edema  Lab Results: Results for orders placed or performed during the hospital encounter of 08/04/15 (from the past 48 hour(s))  CBC     Status: None   Collection Time: 08/10/15  5:13 AM  Result Value Ref Range   WBC 9.3 4.0 - 10.5 K/uL   RBC 5.64 4.22 - 5.81 MIL/uL   Hemoglobin 16.8 13.0 - 17.0 g/dL   HCT 51.7 39.0 - 52.0 %   MCV 91.7 78.0 - 100.0 fL   MCH 29.8 26.0 - 34.0 pg   MCHC 32.5 30.0 - 36.0 g/dL   RDW 14.7 11.5 - 15.5 %   Platelets 186 150 - 400 K/uL  Basic metabolic panel     Status: Abnormal   Collection Time: 08/10/15  5:13 AM  Result Value Ref Range   Sodium 139 135 - 145 mmol/L   Potassium 3.8 3.5 - 5.1 mmol/L   Chloride 100 (L) 101 - 111 mmol/L   CO2 26 22 - 32 mmol/L   Glucose, Bld 127 (H) 65 - 99 mg/dL   BUN 26 (H) 6 - 20 mg/dL   Creatinine, Ser 1.51 (H) 0.61 - 1.24 mg/dL   Calcium 9.4 8.9 - 10.3 mg/dL   GFR calc non Af Amer 50 (L) >60 mL/min   GFR calc Af Amer 57 (L) >60 mL/min    Comment:  (NOTE) The eGFR has been calculated using the CKD EPI equation. This calculation has not been validated in all clinical situations. eGFR's persistently <60 mL/min signify possible Chronic Kidney Disease.    Anion gap 13 5 - 15  CBC     Status: None   Collection Time: 08/11/15  4:37 AM  Result Value Ref Range   WBC 9.7 4.0 - 10.5 K/uL   RBC 5.46 4.22 - 5.81 MIL/uL   Hemoglobin 16.2 13.0 - 17.0 g/dL   HCT 49.7 39.0 - 52.0 %   MCV 91.0 78.0 - 100.0 fL   MCH 29.7 26.0 - 34.0 pg   MCHC 32.6 30.0 - 36.0 g/dL   RDW 14.6 11.5 - 15.5 %   Platelets 186 150 - 400 K/uL    Imaging: Imaging results have been reviewed  Tele- Afib with VR 100 Assessment/Plan:   1. Principal Problem: 2.   New onset atrial fibrillation ([place]) 3. Active Problems: 4.   Morbid obesity ([place]) 5.   Suspected sleep apnea 6.   Asthma 7.   Acute CHF (congestive heart failure) ([place]) 8.   Elevated blood pressure 9.   Hyperkalemia 10.   Hyperglycemia 11.   Atrial fibrillation  with rapid ventricular response ([place]) 12.   Diabetes mellitus ([place]) 13.   Time Spent Directly with Patient:  15 minutes  Length of Stay:  LOS: 7 days   Remains in AFIB with VR 100. On BB and Amio. On Xarelto. Plan TEE DCCV tomorrow at 10:00. Pt will be NPO after MN. Exam benign. Labs OK.  Quay Burow 08/11/2015, 10:20 AM

## 2015-08-12 ENCOUNTER — Other Ambulatory Visit: Payer: Self-pay | Admitting: Physician Assistant

## 2015-08-12 ENCOUNTER — Inpatient Hospital Stay (HOSPITAL_COMMUNITY): Payer: BLUE CROSS/BLUE SHIELD | Admitting: Anesthesiology

## 2015-08-12 ENCOUNTER — Telehealth: Payer: Self-pay | Admitting: Cardiology

## 2015-08-12 ENCOUNTER — Encounter (HOSPITAL_COMMUNITY): Payer: Self-pay | Admitting: *Deleted

## 2015-08-12 ENCOUNTER — Encounter (HOSPITAL_COMMUNITY)
Admission: EM | Disposition: A | Discharge status: Home / Self Care | Payer: Self-pay | Source: Home / Self Care | Attending: Cardiology

## 2015-08-12 DIAGNOSIS — N179 Acute kidney failure, unspecified: Secondary | ICD-10-CM

## 2015-08-12 HISTORY — PX: CARDIOVERSION: SHX1299

## 2015-08-12 LAB — CBC
HEMATOCRIT: 50.1 % (ref 39.0–52.0)
HEMOGLOBIN: 16.4 g/dL (ref 13.0–17.0)
MCH: 29.7 pg (ref 26.0–34.0)
MCHC: 32.7 g/dL (ref 30.0–36.0)
MCV: 90.8 fL (ref 78.0–100.0)
Platelets: 200 10*3/uL (ref 150–400)
RBC: 5.52 MIL/uL (ref 4.22–5.81)
RDW: 14.5 % (ref 11.5–15.5)
WBC: 8.5 10*3/uL (ref 4.0–10.5)

## 2015-08-12 LAB — BASIC METABOLIC PANEL
Anion gap: 13 (ref 5–15)
BUN: 22 mg/dL — AB (ref 6–20)
CALCIUM: 9.6 mg/dL (ref 8.9–10.3)
CHLORIDE: 101 mmol/L (ref 101–111)
CO2: 28 mmol/L (ref 22–32)
CREATININE: 1.65 mg/dL — AB (ref 0.61–1.24)
GFR calc Af Amer: 52 mL/min — ABNORMAL LOW (ref >60)
GFR calc non Af Amer: 45 mL/min — ABNORMAL LOW (ref >60)
Glucose, Bld: 95 mg/dL (ref 65–99)
Potassium: 3.8 mmol/L (ref 3.5–5.1)
Sodium: 142 mmol/L (ref 135–145)

## 2015-08-12 LAB — GLUCOSE, CAPILLARY: Glucose-Capillary: 124 mg/dL — ABNORMAL HIGH (ref 65–99)

## 2015-08-12 SURGERY — CARDIOVERSION
Anesthesia: Monitor Anesthesia Care

## 2015-08-12 MED ORDER — LIDOCAINE HCL (CARDIAC) 20 MG/ML IV SOLN
INTRAVENOUS | Status: DC | PRN
  Administered 2015-08-12: 50 mg via INTRATRACHEAL

## 2015-08-12 MED ORDER — RIVAROXABAN 20 MG PO TABS: 20.0000 mg | ORAL_TABLET | Freq: Every day | ORAL | Status: DC

## 2015-08-12 MED ORDER — PROMETHAZINE HCL 25 MG/ML IJ SOLN: 6.2500 mg | INTRAMUSCULAR | Status: DC | PRN

## 2015-08-12 MED ORDER — PROPOFOL 10 MG/ML IV BOLUS
INTRAVENOUS | Status: DC | PRN
  Administered 2015-08-12: 60 mg via INTRAVENOUS

## 2015-08-12 MED ORDER — CARVEDILOL 12.5 MG PO TABS: 12.5000 mg | ORAL_TABLET | Freq: Two times a day (BID) | ORAL | Status: DC

## 2015-08-12 MED ORDER — POTASSIUM CHLORIDE CRYS ER 20 MEQ PO TBCR: 20.0000 meq | EXTENDED_RELEASE_TABLET | Freq: Every day | ORAL | Status: DC

## 2015-08-12 MED ORDER — AMIODARONE HCL 400 MG PO TABS
400.0000 mg | ORAL_TABLET | Freq: Two times a day (BID) | ORAL | Status: DC
Start: 2015-08-12 — End: 2015-08-19

## 2015-08-12 MED ORDER — ATORVASTATIN CALCIUM 40 MG PO TABS: 40.0000 mg | ORAL_TABLET | Freq: Every day | ORAL | Status: DC

## 2015-08-12 MED ORDER — SPIRONOLACTONE 25 MG PO TABS: 25.0000 mg | ORAL_TABLET | Freq: Every day | ORAL | Status: DC

## 2015-08-12 MED ORDER — FUROSEMIDE 40 MG PO TABS: 40.0000 mg | ORAL_TABLET | Freq: Two times a day (BID) | ORAL | Status: DC

## 2015-08-12 NOTE — Progress Notes (Signed)
Patient placed himself on CPAP for the night. Patient was tolerating well at this time.

## 2015-08-12 NOTE — CV Procedure (Signed)
Electrical Cardioversion Procedure Note James [name] [id number] [birth year]  Procedure: Electrical Cardioversion Indications:  Atrial Fibrillation  Procedure Details Consent: Risks of procedure as well as the alternatives and risks of each were explained to the (patient/caregiver).  Consent for procedure obtained.   Time Out: Verified patient identification, verified procedure, site/side was marked, verified correct patient position, special equipment/implants available, medications/allergies/relevent history reviewed, required imaging and test results available.  Performed  Patient placed on cardiac monitor, pulse oximetry, supplemental oxygen as necessary.  Sedation given: Propofol 65 mg Pacer pads placed anterior and posterior chest.  Cardioverted 1 time(s).  Cardioverted at 200J.  Evaluation Findings: Post procedure EKG shows: NSR Complications: None Patient did tolerate procedure well.   James Hook, MD  08/12/2015, 10:16 AM

## 2015-08-12 NOTE — Anesthesia Preprocedure Evaluation (Addendum)
Anesthesia Evaluation  Patient identified by MRN, date of birth, ID band Patient awake    Reviewed: Allergy & Precautions, NPO status , Patient's Chart, lab work & pertinent test results  Airway Mallampati: II  TM Distance: >3 FB Neck ROM: Full    Dental no notable dental hx.    Pulmonary neg pulmonary ROS, former smoker,    Pulmonary exam normal breath sounds clear to auscultation       Cardiovascular hypertension, +CHF  Normal cardiovascular exam+ dysrhythmias Atrial Fibrillation  Rhythm:Irregular Rate:Normal + Systolic murmurs Left ventricle: The cavity size was mildly dilated. Wall thickness was normal. Systolic function was severely reduced. The estimated ejection fraction was in the range of 20% to 25%. Diffuse hypokinesis. - Mitral valve: There was mild regurgitation. - Left atrium: The atrium was mildly dilated. - Right ventricle: Systolic function was mildly reduced.  Impressions:  - Severe global reduction in LV systolic function; mild LVE; mild LAE; mildly reduced RV function; mild MR.     Neuro/Psych negative neurological ROS  negative psych ROS   GI/Hepatic negative GI ROS, Neg liver ROS,   Endo/Other  Morbid obesity  Renal/GU negative Renal ROS  negative genitourinary   Musculoskeletal negative musculoskeletal ROS (+)   Abdominal   Peds negative pediatric ROS (+)  Hematology negative hematology ROS (+)   Anesthesia Other Findings   Reproductive/Obstetrics negative OB ROS                            Anesthesia Physical Anesthesia Plan  ASA: IV  Anesthesia Plan: MAC   Post-op Pain Management:    Induction: Intravenous  Airway Management Planned: Nasal Cannula  Additional Equipment:   Intra-op Plan:   Post-operative Plan:   Informed Consent: I have reviewed the patients History and Physical, chart, labs and discussed the procedure including the  risks, benefits and alternatives for the proposed anesthesia with the patient or authorized representative who has indicated his/her understanding and acceptance.   Dental advisory given  Plan Discussed with: CRNA and Surgeon  Anesthesia Plan Comments:         Anesthesia Quick Evaluation

## 2015-08-12 NOTE — Telephone Encounter (Signed)
Remains in hospital.

## 2015-08-12 NOTE — Transfer of Care (Signed)
Immediate Anesthesia Transfer of Care Note  Patient: James [name]  Procedure(s) Performed: Procedure(s): CARDIOVERSION (N/A)  Patient Location: Endoscopy Unit  Anesthesia Type:MAC  Level of Consciousness: awake, alert , oriented and patient cooperative  Airway & Oxygen Therapy: Patient Spontanous Breathing and Patient connected to nasal cannula oxygen  Post-op Assessment: Report given to RN and Post -op Vital signs reviewed and stable  Post vital signs: Reviewed and stable  Last Vitals:  Filed Vitals:   08/12/15 0342 08/12/15 0904  BP: 111/75 122/88  Pulse: 86 88  Temp: 36.9 C 36.9 C  Resp: 17 22    Complications: No apparent anesthesia complications

## 2015-08-12 NOTE — Anesthesia Postprocedure Evaluation (Signed)
Anesthesia Post Note  Patient: James [name]  Procedure(s) Performed: Procedure(s) (LRB): CARDIOVERSION (N/A)  Patient location during evaluation: PACU Anesthesia Type: MAC Level of consciousness: awake and alert Pain management: pain level controlled Vital Signs Assessment: post-procedure vital signs reviewed and stable Respiratory status: spontaneous breathing and respiratory function stable Cardiovascular status: blood pressure returned to baseline and stable Postop Assessment: no signs of nausea or vomiting Anesthetic complications: no    Last Vitals:  Filed Vitals:   08/12/15 0904 08/12/15 1031  BP: 122/88 114/78  Pulse: 88 66  Temp: 36.9 C 37 C  Resp: 22 16    Last Pain:  Filed Vitals:   08/12/15 1035  PainSc: 0-No pain                 Rafiel Mecca S

## 2015-08-12 NOTE — Telephone Encounter (Signed)
TOC phone call . Appt is on 08/19/15 at 10:30am w/ Wilburt Finlay at [facility] office   Thanks

## 2015-08-12 NOTE — Progress Notes (Signed)
Subjective: Ambulating without difficulty.  No CP  Objective: Vital signs in last 24 hours: Temp:  [98.4 F (36.9 C)] 98.4 F (36.9 C) (01/13 0904) Pulse Rate:  [82-88] 88 (01/13 0904) Resp:  [17-22] 22 (01/13 0904) BP: (110-122)/(74-88) 122/88 mmHg (01/13 0904) SpO2:  [98 %-100 %] 100 % (01/13 0904) Weight:  [271 lb 1.6 oz (122.97 kg)] 271 lb 1.6 oz (122.97 kg) (01/13 0342) Last BM Date: 08/11/15  Intake/Output from previous day: 01/12 0701 - 01/13 0700 In: 840 [P.O.:840] Out: 1800 [Urine:1800] Intake/Output this shift:    Medications Scheduled Meds: . [MAR Hold] amiodarone  400 mg Oral BID  . [MAR Hold] atorvastatin  40 mg Oral q1800  . [MAR Hold] carvedilol  12.5 mg Oral BID WC  . [MAR Hold] furosemide  40 mg Oral BID  . [MAR Hold] potassium chloride  20 mEq Oral Daily  . [MAR Hold] rivaroxaban  20 mg Oral Q supper  . [MAR Hold] silver sulfADIAZINE   Topical BID  . [MAR Hold] sodium chloride  3 mL Intravenous Q12H  . [MAR Hold] sodium chloride  3 mL Intravenous Q12H  . [MAR Hold] sodium chloride  3 mL Intravenous Q12H  . [MAR Hold] spironolactone  25 mg Oral Daily   Continuous Infusions: . sodium chloride 250 mL (08/08/15 0815)  . sodium chloride     PRN Meds:.[MAR Hold] sodium chloride, [MAR Hold] acetaminophen, [MAR Hold] ondansetron (ZOFRAN) IV, [MAR Hold] sodium chloride, [MAR Hold] sodium chloride, [MAR Hold] sodium chloride  PE: General appearance: alert, cooperative and no distress Neck: no JVD Lungs: clear to auscultation bilaterally Heart: irregularly irregular rhythm Abdomen: +BS, nontender Extremities: No LEE Pulses: 2+ and symmetric Skin: Very dry. Neurologic: Grossly normal  Lab Results:   Recent Labs  08/10/15 0513 08/11/15 0437 08/12/15 0449  WBC 9.3 9.7 8.5  HGB 16.8 16.2 16.4  HCT 51.7 49.7 50.1  PLT 186 186 200   BMET  Recent Labs  08/10/15 0513 08/11/15 0437 08/12/15 0449  NA 139 141 142  K 3.8 4.1 3.8  CL 100* 104  101  CO2 26 24 28   GLUCOSE 127* 115* 95  BUN 26* 23* 22*  CREATININE 1.51* 1.65* 1.65*  CALCIUM 9.4 9.4 9.6   Lipid Panel     Component Value Date/Time   CHOL 177 08/05/2015 0534   TRIG 105 08/05/2015 0534   HDL 35* 08/05/2015 0534   CHOLHDL 5.1 08/05/2015 0534   VLDL 21 08/05/2015 0534   LDLCALC 121* 08/05/2015 0534       Assessment/Plan    New onset atrial fibrillation (HCC) SP unsuccessful  DCCv.  Loaded with amio and TEE DCCv today.  Rate controlled.  Also on Xarelto and BB.    Atrial fibrillation with rapid ventricular response (HCC)   Acute kidney injury (HCC)  Likely prerenal from CHF and afib.  Hold lasix today.  The patient appears dry.  1.53 1.51 1.65 1.65      Will need BMET next week.      Morbid obesity (HCC)  Weight loss advised.    Suspected sleep apnea He felt great on CPAP during admission. Plan to discharge on CPAP, Case manger will reschedule sleep study.  - During TEE - The patient had a difficult probe insertion and desaturated before the insertion. He appears to have obstructions in his airway and needs to be evaluated for sleep apnea.   Asthma   Acute CHF (congestive heart failure) (HCC) Net fluids: -1.0L/ -12.4L.  Hold lasix and aldactone today.  Appears dry.  Low sodium diet and daily weight monitoring discussed.  EF 20-25% with global hypokinesis, Mild LA dilation. Mild MR.      Nonischemic Cardiomyopathy SP nuclear stress test revealing no ischemia, Fixed inferior wall defect may be artifactual secondary to diaphragmatic attenuation or represent the sequelae of prior inferior wall infarct/scarring.dilated LV with global hypokinesis.    Elevated blood pressure:  Controlled on current meds.    Hyperkalemia: resolved.    Hyperglycemia   Diabetes mellitus (HCC)  New.  A1C 6.9.  Will need PCP follow up.  Not adding metformin due to AKI.   Glucose 109-121.     Dyslipidemia:    LDL 121.  On lipitor now.  LFTs and Lipids in 6 weeks.       LOS:  8 days    HAGER, BRYAN PA-C 08/12/2015 9:05 AM   Agree with note written by Jones Skene PAC  S/P successful DCCV on Amio. Feels better. No evid of CHF. On Xarelto. Exam benign. SCr 1.65. OK to DC home today and F/U with Dr. Ludger Nutting, Christiane Ha 08/12/2015 12:48 PM

## 2015-08-12 NOTE — Discharge Summary (Signed)
Physician Discharge Summary   Cardiologist:  Hochrein-New  Patient ID: James [name] MRN: [id number] DOB/AGE: 1956-05-18 60 y.o.  Admit date: 08/04/2015 Discharge date: 08/12/2015  Admission Diagnoses:  New onset afib with RVR,  Acute CHF  Discharge Diagnoses:  Principal Problem:   New onset atrial fibrillation (HCC) Active Problems:   Atrial fibrillation with rapid ventricular response (HCC)   Acute kidney injury (HCC)   Morbid obesity (HCC)   Suspected sleep apnea   Asthma   Acute CHF (congestive heart failure) (HCC)   Elevated blood pressure   Hyperkalemia   Hyperglycemia   Diabetes mellitus (HCC)   Discharged Condition: stable  Hospital Course:   James [name] is a 60 y/o M with history of remote asthma, morbid obesity, suspected OSA, former tobacco abuse who presented to [facility] with dyspnea since Sunday 07/31/15. He has no prior cardiac history. He has never been tested for sleep apnea but reports heavy snoring and having been told by an ex-girlfriend that he stops breathing in the middle of the night. He's noticed his weight gradually increasing over the last 20 years. He has had LEE for about a year. On 07/31/15 he began to develop what he thought was asthma - had issues with this remotely, but not recently. He took some Bronkaid tablets (ephedrine-guaifenesin) but symptoms did not improve. SOB gradually got worse, progressing to DOE with even minimal activity and orthopnea preventing him from sleeping. He almost called EMS last night due to PND. He went to urgent care this AM where initial BP was 158/98 and he was in rapid atrial fib. He was transported to [facility] where initial HR was 156. He received 10mg  IV diltiazem along with a drip that has since been titrated. HR was still 120s-130s so we gave 5mg  IV lopressor with improvement in HR down to 95-105. He has been started on IV heparin by the ER. CXR most c/w mild pulmonary edema. He denies any chest pain, awareness of palpitations, prior  history of similar symptoms, history of TIA/stroke, or prior bleeding issues. He currently feels stable at rest. No recent wheezing. Labwork reveals BMET with K 5.2 (slight hemolysis), BUN/Cr 24/1.24, glucose 143, troponin neg x 1, CBC WNL.  Patient was admitted for IV diuresis. He was started on Lasix 40 mg twice daily. Was continued on IV Cardizem and beta blocker was added. He was started on Xarelto because his CHADSVASC is 2. Cardizem was ultimately discontinued due to severely decreased ejection fraction. Coreg was titrated.  He underwent TEE cardioversion on 08/08/2015 which was unsuccessful.  Electrophysiology consult and recommended he be started on amiodarone.  TSH was normal.  A second attempt at TEE cardioversion was completed on 08/12/2015 and it was successful.  2-D echocardiogram revealed ejection fraction 20-25% with diffuse hypokinesis. There was mild MR.  Left atrium was mildly dilated.  He subsequent underwent nuclear stress testing which showed a fixed inferior wall defect which may be artifactual secondary to diaphragmatic attenuation or represent sequela of prior inferior wall infarct/scarring.  No ischemia was noted.  He ultimately diuresed 12.4 L of fluid. His Lasix was changed to 40 mg by mouth twice daily and he was also started on spironolactone 25 mg. He was also started on Lipitor 40 mg.  He had some improvement in serum creatinine down to the low about 1.47 then it stabilized and the last 2 were 1.65 with a GFR of about 45.   Lasix was held the day of discharge. ACE inhibitor was not started  because of this. Daily weight monitoring and low-sodium diet was discussed with the patient. We'll need to recheck a basic metabolic panel next week.  He was seen by diabetes educator as his A1c was 6.9 and this is a new finding. Diabetes education was discussed.  Will need follow-up with primary care provider.  He has suspected sleep apnea was started on CPAP machine.  The patient was seen by Dr.  Allyson Sabal who felt he was stable for DC home.   Outpatient needs: Sleep study: Sep 18, 2015 LFTs and lipids in 6 weeks  Basic metabolic panel  Consults: EP, Diabetes coordinator,. Registered dietitian  Significant Diagnostic Studies:  MYOCARDIAL IMAGING WITH SPECT (REST AND PHARMACOLOGIC-STRESS)  GATED LEFT VENTRICULAR WALL MOTION STUDY  LEFT VENTRICULAR EJECTION FRACTION  TECHNIQUE: Standard myocardial SPECT imaging was performed after resting intravenous injection of 10 mCi Tc-34m sestamibi. Subsequently, intravenous infusion of Lexiscan was performed under the supervision of the Cardiology staff. At peak effect of the drug, 30 mCi Tc-72m sestamibi was injected intravenously and standard myocardial SPECT imaging was performed. Quantitative gated imaging was also performed to evaluate left ventricular wall motion, and estimate left ventricular ejection fraction.  COMPARISON: Chest x-ray 08/04/2015  FINDINGS: Perfusion: Decreased activity within the inferior wall both at rest, and following pharmacological stress. This may represent diaphragmatic attenuation or the sequelae of a remote prior inferior wall infarct. No focally decreased activity following stress to suggest a region of acute ischemia.  Wall Motion: The left ventricle is dilated and there is global hypokinesis.  Left Ventricular Ejection Fraction: 30 %  End diastolic volume 161 ml  End systolic volume 113 ml  IMPRESSION: 1. Fixed inferior wall defect may be artifactual secondary to diaphragmatic attenuation or represent the sequelae of prior inferior wall infarct/scarring.  2. Dilated left ventricle with global hypokinesis.  3. Left ventricular ejection fraction 30%  4. High-risk stress test findings*.  Echocardiogram Study Conclusions  - Left ventricle: The cavity size was mildly dilated. Wall thickness was normal. Systolic function was severely reduced. The estimated ejection  fraction was in the range of 20% to 25%. Diffuse hypokinesis. - Mitral valve: There was mild regurgitation. - Left atrium: The atrium was mildly dilated. - Right ventricle: Systolic function was mildly reduced.  Impressions:  - Severe global reduction in LV systolic function; mild LVE; mild LAE; mildly reduced RV function; mild MR.   Treatments: See above  Discharge Exam: Blood pressure 118/67, pulse 68, temperature 98.6 F (37 C), temperature source Oral, resp. rate 15, height  (1.727 m), weight 271 lb 1.6 oz (122.97 kg), SpO2 96 %.   Disposition: Final discharge disposition not confirmed      Discharge Instructions    Diet - low sodium heart healthy    Complete by:  As directed      Discharge instructions    Complete by:  As directed   Monitor your weight every morning.  If you gain 3 pounds in 24 hours, or 5 pounds in a week, call the office for instructions.     Increase activity slowly    Complete by:  As directed             Medication List    TAKE these medications        amiodarone 400 MG tablet  Commonly known as:  PACERONE  Take 1 tablet (400 mg total) by mouth 2 (two) times daily.     atorvastatin 40 MG tablet  Commonly known as:  LIPITOR  Take 1 tablet (40 mg total) by mouth daily at 6 PM.     carvedilol 12.5 MG tablet  Commonly known as:  COREG  Take 1 tablet (12.5 mg total) by mouth 2 (two) times daily with a meal.     furosemide 40 MG tablet  Commonly known as:  LASIX  Take 1 tablet (40 mg total) by mouth 2 (two) times daily.     potassium chloride SA 20 MEQ tablet  Commonly known as:  K-DUR,KLOR-CON  Take 1 tablet (20 mEq total) by mouth daily.     rivaroxaban 20 MG Tabs tablet  Commonly known as:  XARELTO  Take 1 tablet (20 mg total) by mouth daily with supper.     spironolactone 25 MG tablet  Commonly known as:  ALDACTONE  Take 1 tablet (25 mg total) by mouth daily.       Follow-up Information    Follow up with [facility]. Go on 09/18/2015.   Why:  @8  pm for sleep study   Contact information:   [address] [place] [address] [phone]      Follow up with Wilburt Finlay, PA-C On 08/19/2015.   Specialties:  Physician Assistant, Radiology, Interventional Cardiology   Why:  10:30 AM   Contact information:   [address] [place] [address] [phone]       Follow up with Labs On 08/16/2015.   Why:  Go to the clinic listed for blood draw.  You can go any time before 2PM   Contact information:   [address] [place] [address] [phone]     Greater than 30 minutes was spent completing the patient's discharge.    SignedWilburt Finlay, PAC  08/12/2015, 4:01 PM

## 2015-08-15 ENCOUNTER — Encounter (HOSPITAL_COMMUNITY): Payer: Self-pay | Admitting: Cardiovascular Disease

## 2015-08-15 NOTE — Telephone Encounter (Signed)
Patient contacted regarding discharge from [facility] on 08/12/15.  Patient understands to follow up with provider Wilburt Finlay, PA-C on 08/19/15 at 10:30am at [facility]. Patient understands discharge instructions? Yes  Patient understands medications and regiment? yes Patient understands to bring all medications to this visit? yes  Patient stated he has some papers he needs signed for work, and he wants to drop them off at [facility] office before his lab appointment.

## 2015-08-16 ENCOUNTER — Other Ambulatory Visit (INDEPENDENT_AMBULATORY_CARE_PROVIDER_SITE_OTHER): Payer: BLUE CROSS/BLUE SHIELD | Admitting: *Deleted

## 2015-08-16 ENCOUNTER — Emergency Department (HOSPITAL_COMMUNITY)
Admission: EM | Admit: 2015-08-16 | Discharge: 2015-08-16 | Disposition: A | Discharge status: Home / Self Care | Payer: BLUE CROSS/BLUE SHIELD | Attending: Emergency Medicine | Admitting: Emergency Medicine

## 2015-08-16 ENCOUNTER — Other Ambulatory Visit: Payer: Self-pay

## 2015-08-16 ENCOUNTER — Encounter (HOSPITAL_COMMUNITY): Payer: Self-pay

## 2015-08-16 ENCOUNTER — Emergency Department (HOSPITAL_COMMUNITY): Payer: BLUE CROSS/BLUE SHIELD

## 2015-08-16 DIAGNOSIS — N179 Acute kidney failure, unspecified: Secondary | ICD-10-CM

## 2015-08-16 DIAGNOSIS — R0609 Other forms of dyspnea: Secondary | ICD-10-CM

## 2015-08-16 DIAGNOSIS — Z7901 Long term (current) use of anticoagulants: Secondary | ICD-10-CM | POA: Diagnosis not present

## 2015-08-16 DIAGNOSIS — J45901 Unspecified asthma with (acute) exacerbation: Secondary | ICD-10-CM | POA: Diagnosis not present

## 2015-08-16 DIAGNOSIS — R0600 Dyspnea, unspecified: Secondary | ICD-10-CM

## 2015-08-16 DIAGNOSIS — Z79899 Other long term (current) drug therapy: Secondary | ICD-10-CM | POA: Insufficient documentation

## 2015-08-16 DIAGNOSIS — I4891 Unspecified atrial fibrillation: Secondary | ICD-10-CM | POA: Diagnosis not present

## 2015-08-16 DIAGNOSIS — Z9889 Other specified postprocedural states: Secondary | ICD-10-CM | POA: Insufficient documentation

## 2015-08-16 DIAGNOSIS — I10 Essential (primary) hypertension: Secondary | ICD-10-CM | POA: Insufficient documentation

## 2015-08-16 DIAGNOSIS — Z87891 Personal history of nicotine dependence: Secondary | ICD-10-CM | POA: Diagnosis not present

## 2015-08-16 DIAGNOSIS — R0602 Shortness of breath: Secondary | ICD-10-CM | POA: Diagnosis present

## 2015-08-16 LAB — BASIC METABOLIC PANEL
ANION GAP: 10 (ref 5–15)
BUN: 24 mg/dL (ref 7–25)
BUN: 24 mg/dL — AB (ref 6–20)
CALCIUM: 9.4 mg/dL (ref 8.6–10.3)
CHLORIDE: 99 mmol/L (ref 98–110)
CHLORIDE: 102 mmol/L (ref 101–111)
CO2: 24 mmol/L (ref 20–31)
CO2: 26 mmol/L (ref 22–32)
Calcium: 9.5 mg/dL (ref 8.9–10.3)
Creat: 1.55 mg/dL — ABNORMAL HIGH (ref 0.70–1.33)
Creatinine, Ser: 1.62 mg/dL — ABNORMAL HIGH (ref 0.61–1.24)
GFR calc Af Amer: 53 mL/min — ABNORMAL LOW (ref >60)
GFR calc non Af Amer: 46 mL/min — ABNORMAL LOW (ref >60)
GLUCOSE: 147 mg/dL — AB (ref 65–99)
GLUCOSE: 120 mg/dL — AB (ref 65–99)
POTASSIUM: 4.4 mmol/L (ref 3.5–5.3)
POTASSIUM: 4.7 mmol/L (ref 3.5–5.1)
SODIUM: 135 mmol/L (ref 135–146)
Sodium: 138 mmol/L (ref 135–145)

## 2015-08-16 LAB — PROTIME-INR
INR: 1.92 — ABNORMAL HIGH (ref 0.00–1.49)
Prothrombin Time: 21.9 seconds — ABNORMAL HIGH (ref 11.6–15.2)

## 2015-08-16 LAB — CBC
HEMATOCRIT: 46.9 % (ref 39.0–52.0)
HEMOGLOBIN: 15.4 g/dL (ref 13.0–17.0)
MCH: 29.5 pg (ref 26.0–34.0)
MCHC: 32.8 g/dL (ref 30.0–36.0)
MCV: 89.8 fL (ref 78.0–100.0)
Platelets: 206 10*3/uL (ref 150–400)
RBC: 5.22 MIL/uL (ref 4.22–5.81)
RDW: 14.1 % (ref 11.5–15.5)
WBC: 6.9 10*3/uL (ref 4.0–10.5)

## 2015-08-16 NOTE — ED Notes (Signed)
Pt is in stable condition upon d/c and ambulates from ED. 

## 2015-08-16 NOTE — ED Notes (Signed)
Patient here with complaining of shortness of breath since last pm, just had cardioversion x 2 last week for new onset of atrial fib. No CP

## 2015-08-16 NOTE — ED Provider Notes (Signed)
CSN: [id number]     Arrival date & time 08/16/15  1124 History   First MD Initiated Contact with Patient 08/16/15 1258     Chief Complaint  Patient presents with  . Palpitations  . Shortness of Breath     (Consider location/radiation/quality/duration/timing/severity/associated sxs/prior Treatment) Patient is a 60 y.o. male presenting with shortness of breath.  Shortness of Breath Severity:  Mild Onset quality:  Gradual Duration:  1 day Timing:  Intermittent Progression:  Waxing and waning Chronicity:  New Context: activity (with walking)   Relieved by:  Nothing Worsened by:  Nothing tried Ineffective treatments:  None tried Associated symptoms: no abdominal pain, no chest pain, no cough, no diaphoresis, no fever and no vomiting   Risk factors: obesity     Past Medical History  Diagnosis Date  . Asthma   . Morbid obesity (HCC)   . Suspected sleep apnea   . Former tobacco use   . New onset atrial fibrillation (HCC) 07/2015  . Hypertension   . Non-ischemic cardiomyopathy [facility])    Past Surgical History  Procedure Laterality Date  . Mouth surgery    . Tee without cardioversion N/A 08/08/2015    Procedure: TRANSESOPHAGEAL ECHOCARDIOGRAM (TEE);  Surgeon: Lars Masson, MD;  Location: [facility] ENDOSCOPY;  Service: Cardiovascular;  Laterality: N/A;  . Cardioversion N/A 08/08/2015    Procedure: CARDIOVERSION;  Surgeon: Lars Masson, MD;  Location: [facility] ENDOSCOPY;  Service: Cardiovascular;  Laterality: N/A;  . Cardioversion N/A 08/12/2015    Procedure: CARDIOVERSION;  Surgeon: Chilton Si, MD;  Location: [facility] ENDOSCOPY;  Service: Cardiovascular;  Laterality: N/A;   Family History  Problem Relation Age of Onset  . CAD Mother     CABG age 76  . CAD Maternal Aunt     CABG age 28   Social History  Substance Use Topics  . Smoking status: Former Games developer  . Smokeless tobacco: Never Used     Comment: Smoked for 20 years, quit ~1995  . Alcohol Use: No    Review of Systems   Constitutional: Negative for fever and diaphoresis.  Respiratory: Positive for shortness of breath. Negative for cough.   Cardiovascular: Negative for chest pain.  Gastrointestinal: Negative for vomiting and abdominal pain.  All other systems reviewed and are negative.     Allergies  Review of patient's allergies indicates no known allergies.  Home Medications   Prior to Admission medications   Medication Sig Start Date End Date Taking? Authorizing Provider  amiodarone (PACERONE) 400 MG tablet Take 1 tablet (400 mg total) by mouth 2 (two) times daily. 08/12/15   Dwana Melena, PA-C  atorvastatin (LIPITOR) 40 MG tablet Take 1 tablet (40 mg total) by mouth daily at 6 PM. 08/12/15   Dwana Melena, PA-C  carvedilol (COREG) 12.5 MG tablet Take 1 tablet (12.5 mg total) by mouth 2 (two) times daily with a meal. 08/12/15   Dwana Melena, PA-C  furosemide (LASIX) 40 MG tablet Take 1 tablet (40 mg total) by mouth 2 (two) times daily. 08/12/15   Dwana Melena, PA-C  potassium chloride SA (K-DUR,KLOR-CON) 20 MEQ tablet Take 1 tablet (20 mEq total) by mouth daily. 08/12/15   Dwana Melena, PA-C  rivaroxaban (XARELTO) 20 MG TABS tablet Take 1 tablet (20 mg total) by mouth daily with supper. 08/12/15   Dwana Melena, PA-C  spironolactone (ALDACTONE) 25 MG tablet Take 1 tablet (25 mg total) by mouth daily. 08/12/15   Dwana Melena, PA-C  BP 135/72 mmHg  Pulse 45  Temp(Src) 98.5 F (36.9 C) (Oral)  Resp 22  Wt 271 lb (122.925 kg)  SpO2 99% Physical Exam  Constitutional: He is oriented to person, place, and time. He appears well-developed and well-nourished. No distress.  HENT:  Head: Normocephalic and atraumatic.  Eyes: Conjunctivae are normal.  Neck: Neck supple. No tracheal deviation present.  Cardiovascular: Regular rhythm and normal heart sounds.   No extrasystoles are present. Bradycardia present.   Pulmonary/Chest: Effort normal and breath sounds normal. No respiratory distress.  Abdominal:  Soft. He exhibits no distension.  Neurological: He is alert and oriented to person, place, and time.  Skin: Skin is warm and dry.  Psychiatric: He has a normal mood and affect.  Vitals reviewed.   ED Course  Procedures (including critical care time) Labs Review Labs Reviewed  BASIC METABOLIC PANEL - Abnormal; Notable for the following:    Glucose, Bld 120 (*)    BUN 24 (*)    Creatinine, Ser 1.62 (*)    GFR calc non Af Amer 46 (*)    GFR calc Af Amer 53 (*)    All other components within normal limits  PROTIME-INR - Abnormal; Notable for the following:    Prothrombin Time 21.9 (*)    INR 1.92 (*)    All other components within normal limits  CBC    Imaging Review Dg Chest 2 View  08/16/2015  CLINICAL DATA:  SOB x last night, light headed x this am, hx of cardio version/AFIB, HTN, diabetic, past smoker EXAM: CHEST  2 VIEW COMPARISON:  08/04/2015 FINDINGS: No cardiac silhouette is mildly enlarged. No mediastinal or hilar masses or evidence of adenopathy. Lungs show mildly thickened interstitial markings, significantly improved when compared to the prior exam. There is no evidence of pneumonia. No pleural effusion or pneumothorax. Bony thorax is intact. IMPRESSION: 1. There has been significant improvement from the prior chest radiograph. Pulmonary edema seen previously is essentially resolved. There is some mild interstitial prominence on the current study which may all be chronic. Minimal interstitial edema is possible. 2. Mild cardiomegaly. 3. No evidence of pneumonia. Electronically Signed   By: Amie Portland M.D.   On: 08/16/2015 13:05   I have personally reviewed and evaluated these images and lab results as part of my medical decision-making.   EKG Interpretation   Date/Time:  Tuesday August 16 2015 11:38:59 EST Ventricular Rate:  51 PR Interval:  164 QRS Duration: 88 QT Interval:  532 QTC Calculation: 490 R Axis:   92 Text Interpretation:  Sinus bradycardia Rightward  axis T wave abnormality,  consider lateral ischemia Prolonged QT Abnormal ECG No significant change  since last tracing Confirmed by Damya Comley MD, Reuel Boom ([id number]) on 08/16/2015  1:26:05 PM      MDM   Final diagnoses:  DOE (dyspnea on exertion)    60 year old male presents with having shortness of breath intermittently with exertion since being discharged from the hospital following cardioversion for uncontrolled atrial fibrillation. He states that his symptoms are different than when he previously had atrial fibrillation. When he was hospitalized he had been diuresed 20 pounds of fluid aggressively and states he has been doing daily weights without any weight gain since discharge. He has had no significant bilateral leg swelling. Today he has no changes on his EKG and shows interval improvement on chest x-ray regarding fluid status. Patient not having chest pain at any point. He is having some mild a symptomatically bradycardia on arrival,  is not orthostatic, ambulates without symptoms and without hypoxemia. He has follow-up in 3 days with his cardiologist which at this time appears to be an appropriate disposition. Strict return precautions were discussed for worsening or new concerning symptoms including shortness of breath, palpitations, or acute weight gain.    Lyndal Pulley, MD 08/16/15 [phone]

## 2015-08-16 NOTE — Discharge Instructions (Signed)

## 2015-08-19 ENCOUNTER — Encounter: Payer: Self-pay | Admitting: Physician Assistant

## 2015-08-19 ENCOUNTER — Ambulatory Visit (INDEPENDENT_AMBULATORY_CARE_PROVIDER_SITE_OTHER): Payer: BLUE CROSS/BLUE SHIELD | Admitting: Physician Assistant

## 2015-08-19 VITALS — BP 115/68 | HR 50 | Ht 68.0 in | Wt 270.0 lb

## 2015-08-19 DIAGNOSIS — I429 Cardiomyopathy, unspecified: Secondary | ICD-10-CM | POA: Diagnosis not present

## 2015-08-19 DIAGNOSIS — I4891 Unspecified atrial fibrillation: Secondary | ICD-10-CM

## 2015-08-19 DIAGNOSIS — Z87891 Personal history of nicotine dependence: Secondary | ICD-10-CM

## 2015-08-19 DIAGNOSIS — G4730 Sleep apnea, unspecified: Secondary | ICD-10-CM

## 2015-08-19 DIAGNOSIS — N179 Acute kidney failure, unspecified: Secondary | ICD-10-CM

## 2015-08-19 DIAGNOSIS — I428 Other cardiomyopathies: Secondary | ICD-10-CM | POA: Insufficient documentation

## 2015-08-19 DIAGNOSIS — R29818 Other symptoms and signs involving the nervous system: Secondary | ICD-10-CM

## 2015-08-19 MED ORDER — AMIODARONE HCL 400 MG PO TABS: 400.0000 mg | ORAL_TABLET | Freq: Every day | ORAL | Status: DC

## 2015-08-19 MED ORDER — HYDRALAZINE HCL 10 MG PO TABS: 10.0000 mg | ORAL_TABLET | Freq: Two times a day (BID) | ORAL | Status: DC

## 2015-08-19 MED ORDER — POTASSIUM CHLORIDE ER 10 MEQ PO TBCR: 10.0000 meq | EXTENDED_RELEASE_TABLET | Freq: Every day | ORAL | Status: DC

## 2015-08-19 MED ORDER — FUROSEMIDE 40 MG PO TABS
40.0000 mg | ORAL_TABLET | Freq: Every day | ORAL | Status: DC
Start: 2015-08-19 — End: 2016-08-20

## 2015-08-19 NOTE — Patient Instructions (Addendum)
Medication Instructions:  Your physician has recommended you make the following change in your medication:  1.  DECREASE the Lasix to 40 mg taking 1 tablet daily 2.  DECREASE the Potassium to 10 meq taking 1 tablet daily 3.  START the Hydralazine 10 mg taking 1 tablet twice a day 4.  DECREASE the Amiodarone 400 mg to taking 1 tablet a day  Labwork: Your physician recommends that you return for a FASTING lipid profile & LFT IN 6 WEEKS  3 MONTHS:  TSH                       FREE T4                       FREE T3   Testing/Procedures: NONE ORDERED  Follow-Up: Your physician recommends that you schedule a follow-up appointment in:  1 MONTH WITH BRYAN HAGER, PA-C OR 1ST AVAILABLE APP @ [facility] Your physician recommends that you schedule a follow-up appointment in: 3 MONTHS WITH DR. HOCHREIN    Any Other Special Instructions Will Be Listed Below (If Applicable).  Monitor your weight on a daily basis and record it.  If you gain 3 lbs in 24 hours or 5 lbs in a week, please call our office.  If you need a refill on your cardiac medications before your next appointment, please call your pharmacy.

## 2015-08-19 NOTE — Progress Notes (Signed)
Patient ID: James [name], male   DOB: 1956-02-06, 60 y.o.   MRN: [id number]    Date:  08/19/2015   ID:  James [name], DOB Oct 13, 1955, MRN [id number]  PCP:  No PCP Per Patient  Primary Cardiologist:  Hochrein  Chief Complaint  Patient presents with  . Hospitalization Follow-up     History of Present Illness: James [name] is a 60 y.o. male with history of remote asthma, morbid obesity, suspected OSA, former tobacco abuse who presented to [facility] with dyspnea since Sunday 07/31/15. He has no prior cardiac history. He has never been tested for sleep apnea but reports heavy snoring and having been told by an ex-girlfriend that he stops breathing in the middle of the night. He's noticed his weight gradually increasing over the last 20 years. He has had LEE for about a year. On 07/31/15 he began to develop what he thought was asthma - had issues with this remotely, but not recently. He took some Bronkaid tablets (ephedrine-guaifenesin) but symptoms did not improve. SOB gradually got worse, progressing to DOE with even minimal activity and orthopnea preventing him from sleeping. He almost called EMS last night due to PND. He went to urgent care this AM where initial BP was 158/98 and he was in rapid atrial fib. He was transported to [facility] where initial HR was 156. He received 10mg  IV diltiazem along with a drip that has since been titrated. HR was still 120s-130s so we gave 5mg  IV lopressor with improvement in HR down to 95-105. He has been started on IV heparin by the ER. CXR most c/w mild pulmonary edema. He denies any chest pain, awareness of palpitations, prior history of similar symptoms, history of TIA/stroke, or prior bleeding issues. He currently feels stable at rest. No recent wheezing. Labwork reveals BMET with K 5.2 (slight hemolysis), BUN/Cr 24/1.24, glucose 143, troponin neg x 1, CBC WNL. Patient was admitted for IV diuresis. He was started on Lasix 40 mg twice daily. Was continued on IV Cardizem  and beta blocker was added. He was started on Xarelto because his CHADSVASC is 2. Cardizem was ultimately discontinued due to severely decreased ejection fraction. Coreg was titrated. He underwent TEE cardioversion on 08/08/2015 which was unsuccessful. Electrophysiology consult and recommended he be started on amiodarone. TSH was normal. A second attempt at TEE cardioversion was completed on 08/12/2015 and it was successful. 2-D echocardiogram revealed ejection fraction 20-25% with diffuse hypokinesis. There was mild MR. Left atrium was mildly dilated. He subsequent underwent nuclear stress testing which showed a fixed inferior wall defect which may be artifactual secondary to diaphragmatic attenuation or represent sequela of prior inferior wall infarct/scarring. No ischemia was noted. He ultimately diuresed 12.4 L of fluid. His Lasix was changed to 40 mg by mouth twice daily and he was also started on spironolactone 25 mg. He was also started on Lipitor 40 mg. He had some improvement in serum creatinine down to the low about 1.47 then it stabilized and the last 2 were 1.65 with a GFR of about 45. Lasix was held the day of discharge. ACE inhibitor was not started because of this. Daily weight monitoring and low-sodium diet was discussed with the patient. We'll need to recheck a basic metabolic panel next week. He was seen by diabetes educator as his A1c was 6.9 and this is a new finding. Diabetes education was discussed. Will need follow-up with primary care provider. He has suspected sleep apnea was started on CPAP machine.  The patient currently  denies nausea, vomiting, fever, chest pain, shortness of breath, orthopnea, dizziness, PND, cough, congestion, abdominal pain, hematochezia, melena, lower extremity edema, claudication.  Lipid Panel     Component Value Date/Time   CHOL 177 08/05/2015 0534   TRIG 105 08/05/2015 0534   HDL 35* 08/05/2015 0534   CHOLHDL 5.1 08/05/2015 0534   VLDL 21  08/05/2015 0534   LDLCALC 121* 08/05/2015 0534      Wt Readings from Last 3 Encounters:  08/19/15 270 lb (122.471 kg)  08/16/15 271 lb (122.925 kg)  08/12/15 271 lb 1.6 oz (122.97 kg)     Past Medical History  Diagnosis Date  . Asthma   . Morbid obesity (HCC)   . Suspected sleep apnea   . Former tobacco use   . New onset atrial fibrillation (HCC) 07/2015  . Hypertension   . Non-ischemic cardiomyopathy [facility])     Current Outpatient Prescriptions  Medication Sig Dispense Refill  . amiodarone (PACERONE) 400 MG tablet Take 1 tablet (400 mg total) by mouth 2 (two) times daily. 75 tablet 11  . atorvastatin (LIPITOR) 40 MG tablet Take 1 tablet (40 mg total) by mouth daily at 6 PM. 30 tablet 11  . carvedilol (COREG) 12.5 MG tablet Take 1 tablet (12.5 mg total) by mouth 2 (two) times daily with a meal. 60 tablet 11  . rivaroxaban (XARELTO) 20 MG TABS tablet Take 1 tablet (20 mg total) by mouth daily with supper. 30 tablet 11  . spironolactone (ALDACTONE) 25 MG tablet Take 1 tablet (25 mg total) by mouth daily. 30 tablet 11  . furosemide (LASIX) 40 MG tablet Take 1 tablet (40 mg total) by mouth daily. 90 tablet 1  . hydrALAZINE (APRESOLINE) 10 MG tablet Take 1 tablet (10 mg total) by mouth 2 (two) times daily. 180 tablet 1  . potassium chloride (K-DUR) 10 MEQ tablet Take 1 tablet (10 mEq total) by mouth daily. 90 tablet 1   No current facility-administered medications for this visit.    Allergies:   No Known Allergies  Social History:  The patient  reports that he has quit smoking. He has never used smokeless tobacco. He reports that he does not drink alcohol or use illicit drugs.   Family history:   Family History  Problem Relation Age of Onset  . CAD Mother     CABG age 3  . CAD Maternal Aunt     CABG age 30    ROS:  Please see the history of present illness.  All other systems reviewed and negative.   PHYSICAL EXAM: VS:  BP 115/68 mmHg  Pulse 50  Ht  (1.727 m)  Wt  270 lb (122.471 kg)  BMI 41.06 kg/m2  SpO2 97%  obese, well developed, in no acute distress HEENT: Pupils are equal round react to light accommodation extraocular movements are intact.  Neck: no JVDNo cervical lymphadenopathy. Cardiac: Regular rhythm  Rate slow, without murmurs rubs or gallops. Lungs:  clear to auscultation bilaterally, no wheezing, rhonchi or rales Abd: soft, nontender, positive bowel sounds all quadrants, no hepatosplenomegaly Ext: no lower extremity edema.  2+ radial and dorsalis pedis pulses. Skin: warm and dry Neuro:  Grossly normal    ASSESSMENT AND PLAN:  Problem List Items Addressed This Visit    Suspected sleep apnea   Nonischemic cardiomyopathy (HCC),  ejection fraactiion 20-25% with diffuse hypokinesis   Relevant Medications   hydrALAZINE (APRESOLINE) 10 MG tablet   furosemide (LASIX) 40 MG tablet   New  onset atrial fibrillation (HCC)   Relevant Medications   hydrALAZINE (APRESOLINE) 10 MG tablet   furosemide (LASIX) 40 MG tablet   Morbid obesity (HCC)   Former tobacco use   Acute kidney injury (HCC)    Other Visit Diagnoses    Atrial fibrillation, unspecified type (HCC)    -  Primary    Relevant Medications    hydrALAZINE (APRESOLINE) 10 MG tablet    furosemide (LASIX) 40 MG tablet    potassium chloride (K-DUR) 10 MEQ tablet    Other Relevant Orders    EKG 12-Lead    Hepatic function panel    Lipid panel    TSH    T4, free    T3, free    Non-ischemic cardiomyopathy (HCC)        Relevant Medications    hydrALAZINE (APRESOLINE) 10 MG tablet    furosemide (LASIX) 40 MG tablet    potassium chloride (K-DUR) 10 MEQ tablet    Other Relevant Orders    Hepatic function panel    Lipid panel    TSH    T4, free    T3, free        Mr. Tresa Endo presents for posthospital follow-up. He reports doing well and sleeping better at night.   His weight has been continuing to decrease and on the 14th he was 274 pounds and today was 269.6. These are his  home weights. Blood pressures range been ranging at home 120/75-136/68 with a heart rate between 45 and 62.   He had follow-up basic metabolic panel and CBC on January 17.   his creatinine is stable but still elevated at 1.62 , BUN of 24. GFR is 46 which places him in stage III kidney disease(May need to see Nephrology).   Hemoglobin, HCT, wbc's, platelets within normal limits. He is still maintaining sinus bradycardia, current rate of 49. QTC is prolonged at 502 ms(not new) magnesium on January 5 was 2.3. Is currently on amiodarone  400 mg twice daily as well as Coreg 12.5 mg.   We'll decrease his amiodarone to once daily now.  Check thyroid panel in 3 months.  He looks euvolemic on exam. Will decrease his Lasix to once daily because of his kidney injury  And because he continues to lose weight. Also decrease potassium to 10 MG daily.    He is not on  ACE inhibitor or ARB because of his kidney function. Will add hydralazine 10 mg twice a day.   Continue on Lipitor. Check LFTs and lipids in 6 weeks. He is scheduled for sleep study in February and despite not having the CPAP at this time he feels he sleeping better. He seems also to have made a drastic change in his eating habits and eating low-carb and sodium. I suspect he will start to lose weight because of this as well. Follow-up in one month with an APP and 3 months with Dr. Antoine Poche.

## 2015-09-01 ENCOUNTER — Telehealth: Payer: Self-pay | Admitting: Physician Assistant

## 2015-09-01 NOTE — Telephone Encounter (Signed)
Received FMLA forms that were completed by [facility] and sent to [facility] office for [employer] PA to sign.  Forms faxed to me today to have Wilburt Finlay, review, complete and sign. Forms given to [facility]. lp

## 2015-09-02 ENCOUNTER — Telehealth: Payer: Self-pay | Admitting: Physician Assistant

## 2015-09-02 NOTE — Telephone Encounter (Signed)
Letter faxed.  Wilburt Finlay PAC

## 2015-09-02 NOTE — Telephone Encounter (Signed)
New message     We completed a FMLA form on the pt. On page 2, question #4 on the form, listed diagnosis and symptoms of the patient. HR need a note written stating something like this...."we are aware of the patient's other conditions, however he is able to work without restrictions". Also, please include a statement stating if pt has no restrictions why follow up appoints are necessary.   Please fax note to HR at [phone].  If any questions, please call nurse.

## 2015-09-02 NOTE — Telephone Encounter (Signed)
FORWARD TO Wilburt Finlay PA  to dictate a letter

## 2015-09-02 NOTE — Telephone Encounter (Signed)
Received signed FMLA forms ([employer]) from [place], [place].  Notified patient forms signed and ready. Faxed copy to his employer [employer] and copy for him ready to pick up. lp

## 2015-09-18 ENCOUNTER — Ambulatory Visit (HOSPITAL_BASED_OUTPATIENT_CLINIC_OR_DEPARTMENT_OTHER): Payer: BLUE CROSS/BLUE SHIELD | Attending: Cardiology

## 2015-09-18 VITALS — Ht 69.0 in | Wt 260.0 lb

## 2015-09-18 DIAGNOSIS — G4730 Sleep apnea, unspecified: Secondary | ICD-10-CM | POA: Diagnosis present

## 2015-09-18 DIAGNOSIS — Z7901 Long term (current) use of anticoagulants: Secondary | ICD-10-CM | POA: Insufficient documentation

## 2015-09-18 DIAGNOSIS — I491 Atrial premature depolarization: Secondary | ICD-10-CM | POA: Diagnosis not present

## 2015-09-18 DIAGNOSIS — Z6838 Body mass index (BMI) 38.0-38.9, adult: Secondary | ICD-10-CM | POA: Diagnosis not present

## 2015-09-18 DIAGNOSIS — R5383 Other fatigue: Secondary | ICD-10-CM | POA: Insufficient documentation

## 2015-09-18 DIAGNOSIS — E669 Obesity, unspecified: Secondary | ICD-10-CM | POA: Diagnosis not present

## 2015-09-18 DIAGNOSIS — G4733 Obstructive sleep apnea (adult) (pediatric): Secondary | ICD-10-CM | POA: Diagnosis not present

## 2015-09-18 DIAGNOSIS — Z79899 Other long term (current) drug therapy: Secondary | ICD-10-CM | POA: Insufficient documentation

## 2015-09-18 DIAGNOSIS — I10 Essential (primary) hypertension: Secondary | ICD-10-CM | POA: Insufficient documentation

## 2015-09-19 ENCOUNTER — Encounter: Payer: Self-pay | Admitting: Physician Assistant

## 2015-09-19 ENCOUNTER — Ambulatory Visit (INDEPENDENT_AMBULATORY_CARE_PROVIDER_SITE_OTHER): Payer: BLUE CROSS/BLUE SHIELD | Admitting: Physician Assistant

## 2015-09-19 ENCOUNTER — Telehealth: Payer: Self-pay | Admitting: Cardiology

## 2015-09-19 VITALS — BP 134/68 | HR 50 | Ht 69.0 in | Wt 259.0 lb

## 2015-09-19 DIAGNOSIS — G4733 Obstructive sleep apnea (adult) (pediatric): Secondary | ICD-10-CM | POA: Insufficient documentation

## 2015-09-19 DIAGNOSIS — N179 Acute kidney failure, unspecified: Secondary | ICD-10-CM

## 2015-09-19 DIAGNOSIS — I4891 Unspecified atrial fibrillation: Secondary | ICD-10-CM

## 2015-09-19 DIAGNOSIS — I5022 Chronic systolic (congestive) heart failure: Secondary | ICD-10-CM

## 2015-09-19 DIAGNOSIS — Z79899 Other long term (current) drug therapy: Secondary | ICD-10-CM | POA: Diagnosis not present

## 2015-09-19 DIAGNOSIS — I10 Essential (primary) hypertension: Secondary | ICD-10-CM | POA: Insufficient documentation

## 2015-09-19 DIAGNOSIS — I428 Other cardiomyopathies: Secondary | ICD-10-CM

## 2015-09-19 DIAGNOSIS — I480 Paroxysmal atrial fibrillation: Secondary | ICD-10-CM | POA: Insufficient documentation

## 2015-09-19 DIAGNOSIS — I429 Cardiomyopathy, unspecified: Secondary | ICD-10-CM

## 2015-09-19 MED ORDER — AMIODARONE HCL 200 MG PO TABS: 200.0000 mg | ORAL_TABLET | Freq: Every day | ORAL | Status: DC

## 2015-09-19 MED ORDER — HYDRALAZINE HCL 50 MG PO TABS: 50.0000 mg | ORAL_TABLET | Freq: Two times a day (BID) | ORAL | Status: DC

## 2015-09-19 NOTE — Addendum Note (Signed)
Addended by: Quintella Reichert on: 09/19/2015 08:45 AM   Modules accepted: Orders

## 2015-09-19 NOTE — Patient Instructions (Signed)
Medication Instructions: Wilburt Finlay, PA-C, has recommended making the following medication changes: DECREASE Amiodarone to 200 mg ONCE daily INCREASE Hydralazine to 50 mg TWICE daily  >>New prescriptions have been sent to your pharmacy electronically  Labwork: Your physician recommends that you return for lab work TODAY.  Your physician recommends that you return for lab work on APRIL 1st.  Testing/Procedures: NONE  Follow-up: Please keep your previously scheduled appointment with Dr Antoine Poche  If you need a refill on your cardiac medications before your next appointment, please call your pharmacy.

## 2015-09-19 NOTE — Sleep Study (Signed)
Patient Name: James [name], James [name] MRN: [id number] Study Date: 09/18/2015 Gender: Male D.O.B: 06-21-1956 Age (years): 60 Referring Provider: Minus Breeding Interpreting Physician: Fransico Him MD, ABSM RPSGT: Baxter Flattery  BMI: 38 Weight (lbs): 260 Height (inches): 69 Neck Size: 20.00  CLINICAL INFORMATION Sleep Study Type: Split Night CPAP Indication for sleep study: Fatigue, Hypertension, Obesity, OSA, Snoring, Witnessed Apneas Epworth Sleepiness Score: 10  SLEEP STUDY TECHNIQUE As per the AASM Manual for the Scoring of Sleep and Associated Events v2.3 (April 2016) with a hypopnea requiring 4% desaturations. The channels recorded and monitored were frontal, central and occipital EEG, electrooculogram (EOG), submentalis EMG (chin), nasal and oral airflow, thoracic and abdominal wall motion, anterior tibialis EMG, snore microphone, electrocardiogram, and pulse oximetry. Continuous positive airway pressure (CPAP) was initiated when the patient met split night criteria and was titrated according to treat sleep-disordered breathing.  MEDICATIONS Medications taken by the patient : Amiodarone, Coreg, Lipitor, Kdur, Xarelto, Lasix, Hydralazine, Spironolactone. Medications administered by patient during sleep study : No sleep medicine administered.  RESPIRATORY PARAMETERS Diagnostic Total AHI (/hr): 49.3  RDI (/hr):49.3 OA Index (/hr):18  CA Index (/hr):3.4 REM AHI (/hr): 37.3  NREM AHI (/hr):51.6    Supine AHI (/hr):57.2  Non-supine AHI (/hr):42.40 Min O2 Sat (%):84.00  Mean O2 (%):94.76    Time below 88% (min):7.0    Titration Optimal Pressure (cm):16  AHI at Optimal Pressure (/hr):4.6 Min O2 at Optimal Pressure (%):92.0 Supine % at Optimal (%):100  Sleep % at Optimal (%):100    SLEEP ARCHITECTURE The recording time for the entire night was 374.7 minutes. During a baseline period of 157.7 minutes, the patient slept for 140.0 minutes in REM and nonREM, yielding a sleep efficiency of  88.8%. Sleep onset after lights out was 7.6 minutes with a REM latency of 64.5 minutes. The patient spent 8.93% of the night in stage N1 sleep, 75.00% in stage N2 sleep, 0.00% in stage N3 and 16.07% in REM.   During the titration period of 209.1 minutes, the patient slept for 200.2 minutes in REM and nonREM, yielding a sleep efficiency of 95.7%. Sleep onset after CPAP initiation was 3.4 minutes with a REM latency of 56.0 minutes. The patient spent 5.25% of the night in stage N1 sleep, 76.77% in stage N2 sleep, 0.00% in stage N3 and 17.99% in REM.  CARDIAC DATA The 2 lead EKG demonstrated sinus rhythm. The mean heart rate was 44.61 beats per minute. Other EKG findings include: PACs.  LEG MOVEMENT DATA The total Periodic Limb Movements of Sleep (PLMS) were 2. The PLMS index was 0.35 .  IMPRESSIONS - Severe obstructive sleep apnea occurred during the diagnostic portion of the study (AHI = 49.3/hour). An optimal PAP pressure was selected for this patient ( 16 cm of water) - No significant central sleep apnea occurred during the diagnostic portion of the study (CAI = 3.4/hour). - Moderate oxygen desaturation was noted during the diagnostic portion of the study (Min O2 =84.00%). - No snoring was audible during the diagnostic portion of the study. - No cardiac abnormalities were noted during this study. - Clinically significant periodic limb movements did not occur during sleep.  DIAGNOSIS - Obstructive Sleep Apnea (327.23 [G47.33 ICD-10])  RECOMMENDATIONS - Trial of CPAP therapy on 16 cm H2O with a Medium size Fisher&Paykel Full Face Mask Simplus mask and heated humidification. - Avoid alcohol, sedatives and other CNS depressants that may worsen sleep apnea and disrupt normal sleep architecture. - Sleep hygiene should be reviewed to assess factors  that may improve sleep quality. - Weight management and regular exercise should be initiated or continued. - Return to [facility] Center for re-evaluation  after 10 weeks of therapy   [place], American Board of Sleep Medicine  ELECTRONICALLY SIGNED ON:  09/19/2015, 8:40 AM [place] PH: [phone]   FX: [phone] [place]

## 2015-09-19 NOTE — Progress Notes (Signed)
Patient ID: Briyan Kleven, male   DOB: 1955-09-04, 60 y.o.   MRN: [id number]    Date:  09/19/2015   ID:  Jeferson Boozer, DOB 29-Jul-1956, MRN [id number]  PCP:  No PCP Per Patient  Primary Cardiologist:  Hochrein   Chief complaint: follow-up evaluation   History of Present Illness: Kenn Rekowski is a 60 y.o. male  Ruth Tully is a 60 y.o. male with history of remote asthma, morbid obesity, suspected OSA, former tobacco abuse who presented to [facility] with dyspnea since Sunday 07/31/15. He has no prior cardiac history. He has never been tested for sleep apnea but reports heavy snoring and having been told by an ex-girlfriend that he stops breathing in the middle of the night. He's noticed his weight gradually increasing over the last 20 years. He has had LEE for about a year. On 07/31/15 he began to develop what he thought was asthma - had issues with this remotely, but not recently. He took some Bronkaid tablets (ephedrine-guaifenesin) but symptoms did not improve. SOB gradually got worse, progressing to DOE with even minimal activity and orthopnea preventing him from sleeping. He almost called EMS last night due to PND. He went to urgent care this AM where initial BP was 158/98 and he was in rapid atrial fib. He was transported to [facility] where initial HR was 156. He received 10mg  IV diltiazem along with a drip that has since been titrated. HR was still 120s-130s so we gave 5mg  IV lopressor with improvement in HR down to 95-105. He has been started on IV heparin by the ER. CXR most c/w mild pulmonary edema. He denies any chest pain, awareness of palpitations, prior history of similar symptoms, history of TIA/stroke, or prior bleeding issues. He currently feels stable at rest. No recent wheezing. Labwork reveals BMET with K 5.2 (slight hemolysis), BUN/Cr 24/1.24, glucose 143, troponin neg x 1, CBC WNL. Patient was admitted for IV diuresis. He was started on Lasix 40 mg twice daily. Was continued on IV Cardizem  and beta blocker was added. He was started on Xarelto because his CHADSVASC is 2. Cardizem was ultimately discontinued due to severely decreased ejection fraction. Coreg was titrated. He underwent TEE cardioversion on 08/08/2015 which was unsuccessful. Electrophysiology consult and recommended he be started on amiodarone. TSH was normal. A second attempt at TEE cardioversion was completed on 08/12/2015 and it was successful. 2-D echocardiogram revealed ejection fraction 20-25% with diffuse hypokinesis. There was mild MR. Left atrium was mildly dilated. He subsequent underwent nuclear stress testing which showed a fixed inferior wall defect which may be artifactual secondary to diaphragmatic attenuation or represent sequela of prior inferior wall infarct/scarring. No ischemia was noted. He ultimately diuresed 12.4 L of fluid. His Lasix was changed to 40 mg by mouth twice daily and he was also started on spironolactone 25 mg. He was also started on Lipitor 40 mg. He had some improvement in serum creatinine down to the low about 1.47 then it stabilized and the last 2 were 1.65 with a GFR of about 45. Lasix was held the day of discharge. ACE inhibitor was not started because of this. Daily weight monitoring and low-sodium diet was discussed with the patient. We'll need to recheck a basic metabolic panel next week. He was seen by diabetes educator as his A1c was 6.9 and this is a new finding. Diabetes education was discussed.     Mr. Selley presents for follow-up evaluation. He completed a sleep study and has severe sleep apnea.  CPAP has been ordered. He said he slept great once the tech was able to dial in the CPAP machine to the appropriate setting.   He reports that he is feeling great. 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 The building that they work in his extremely large and he must walk about 800 feet 4-8 times a day. At first it was difficult but now is not having any problems.   He continues to lose  weight and is down another 11 pounds since I saw him one month ago. He is not feeling dizzy or lightheaded particularly when he changes position.  The patient currently denies nausea, vomiting, fever, chest pain, shortness of breath, orthopnea, dizziness, PND, cough, congestion, abdominal pain, hematochezia, melena, lower extremity edema, claudication.  Wt Readings from Last 3 Encounters:  09/19/15 259 lb (117.482 kg)  09/18/15 260 lb (117.935 kg)  08/19/15 270 lb (122.471 kg)     Past Medical History  Diagnosis Date  . Asthma   . Morbid obesity (HCC)   . Suspected sleep apnea   . Former tobacco use   . New onset atrial fibrillation (HCC) 07/2015  . Hypertension   . Non-ischemic cardiomyopathy [facility])     Current Outpatient Prescriptions  Medication Sig Dispense Refill  . amiodarone (PACERONE) 200 MG tablet Take 1 tablet (200 mg total) by mouth daily. 30 tablet 11  . atorvastatin (LIPITOR) 40 MG tablet Take 1 tablet (40 mg total) by mouth daily at 6 PM. 30 tablet 11  . carvedilol (COREG) 12.5 MG tablet Take 1 tablet (12.5 mg total) by mouth 2 (two) times daily with a meal. 60 tablet 11  . furosemide (LASIX) 40 MG tablet Take 1 tablet (40 mg total) by mouth daily. 90 tablet 1  . hydrALAZINE (APRESOLINE) 50 MG tablet Take 1 tablet (50 mg total) by mouth 2 (two) times daily. 60 tablet 11  . potassium chloride (K-DUR) 10 MEQ tablet Take 1 tablet (10 mEq total) by mouth daily. 90 tablet 1  . rivaroxaban (XARELTO) 20 MG TABS tablet Take 1 tablet (20 mg total) by mouth daily with supper. 30 tablet 11  . spironolactone (ALDACTONE) 25 MG tablet Take 1 tablet (25 mg total) by mouth daily. 30 tablet 11   No current facility-administered medications for this visit.    Allergies:   No Known Allergies  Social History:  The patient  reports that he has quit smoking. He has never used smokeless tobacco. He reports that he does not drink alcohol or use illicit drugs.   Family history:   Family  History  Problem Relation Age of Onset  . CAD Mother     CABG age 58  . CAD Maternal Aunt     CABG age 31    ROS:  Please see the history of present illness.  All other systems reviewed and negative.   PHYSICAL EXAM: VS:  BP 134/68 mmHg  Pulse 50  Ht 5\' 9"  (1.753 m)  Wt 259 lb (117.482 kg)  BMI 38.23 kg/m2  obese, well developed, in no acute distress HEENT: Pupils are equal round react to light accommodation extraocular movements are intact.  Neck: no JVDNo cervical lymphadenopathy. Cardiac: Regular rate and rhythm without murmurs rubs or gallops. Lungs:  clear to auscultation bilaterally, no wheezing, rhonchi or rales Abd: soft, nontender, positive bowel sounds all quadrants, no hepatosplenomegaly Ext: no lower extremity edema.  2+ radial and dorsalis pedis pulses. Skin: warm and dry Neuro:  Grossly normal  EKG:  Sinus bradycardia rate 50 bpm  ASSESSMENT AND PLAN:  Problem List Items Addressed This Visit    Paroxysmal atrial fibrillation (HCC)   Relevant Medications   hydrALAZINE (APRESOLINE) 50 MG tablet   amiodarone (PACERONE) 200 MG tablet   OSA (obstructive sleep apnea)   Nonischemic cardiomyopathy (HCC),  ejection fraactiion 20-25% with diffuse hypokinesis   Relevant Medications   hydrALAZINE (APRESOLINE) 50 MG tablet   amiodarone (PACERONE) 200 MG tablet   Morbid obesity (HCC)   Essential hypertension   Relevant Medications   hydrALAZINE (APRESOLINE) 50 MG tablet   amiodarone (PACERONE) 200 MG tablet   Chronic systolic heart failure (HCC)   Relevant Medications   hydrALAZINE (APRESOLINE) 50 MG tablet   amiodarone (PACERONE) 200 MG tablet   Acute kidney injury (HCC)    Other Visit Diagnoses    Atrial fibrillation, unspecified type (HCC)    -  Primary    Relevant Medications    hydrALAZINE (APRESOLINE) 50 MG tablet    amiodarone (PACERONE) 200 MG tablet    Other Relevant Orders    EKG 12-Lead    Basic metabolic panel    Non-ischemic cardiomyopathy  (HCC)        Relevant Medications    hydrALAZINE (APRESOLINE) 50 MG tablet    amiodarone (PACERONE) 200 MG tablet    Other Relevant Orders    EKG 12-Lead    Basic metabolic panel    Medication management        Relevant Orders    Basic metabolic panel    On amiodarone therapy        Relevant Orders    TSH       Mr. Effie Shy appears to be doing very well.   He's made great strides to change his diet and lose weight. Is down 11 pounds since her visit 1 month ago. He appears euvolemic. His blood pressures on his home chart are ranging 120s to 140s. We'll increase his hydralazine to 50 mg twice a day. We last checked his basic metabolic panel and his serum creatinine was 1.62. We'll recheck that on the third when he comes in for his LFTs and lipid panel.   I'll continue his current dose of Lasix.   His EKG shows sinus bradycardia.   I will decrease his amiodarone to 200 mg daily.   Check a TSH around April 1.  An order was written for CPAP he has been scheduled to get the machine.   His follow-up at the end of March.

## 2015-09-19 NOTE — Telephone Encounter (Signed)
Please let patient know that they have significant sleep apnea and had successful CPAP titration and will be set up with CPAP unit.  Please let DME know that order is in EPIC.  Please set patient up for OV in 10 weeks 

## 2015-09-20 NOTE — Telephone Encounter (Signed)
Patient is aware of results. Stated verbal understanding.  [facility] Has been notified of orders

## 2015-09-20 NOTE — Telephone Encounter (Signed)
Once patient has started CPAP therapy, I will schedule 10 week follow-up

## 2015-09-25 ENCOUNTER — Ambulatory Visit (HOSPITAL_BASED_OUTPATIENT_CLINIC_OR_DEPARTMENT_OTHER): Payer: BLUE CROSS/BLUE SHIELD

## 2015-09-27 ENCOUNTER — Telehealth: Payer: Self-pay | Admitting: Cardiology

## 2015-09-27 NOTE — Telephone Encounter (Signed)
Pt says he have not gotten an appointment,phone call or his C Pap machine. Please call.

## 2015-09-27 NOTE — Telephone Encounter (Signed)
I have sent a message to [facility] to follow-up on this.  Patient states that he was told from [facility] that its on hold, so I told him I would check on it to see what is going on.  Patient was appreciative for the call and help.

## 2015-09-30 ENCOUNTER — Other Ambulatory Visit (INDEPENDENT_AMBULATORY_CARE_PROVIDER_SITE_OTHER): Payer: BLUE CROSS/BLUE SHIELD | Admitting: *Deleted

## 2015-09-30 DIAGNOSIS — I4891 Unspecified atrial fibrillation: Secondary | ICD-10-CM | POA: Diagnosis not present

## 2015-09-30 DIAGNOSIS — I429 Cardiomyopathy, unspecified: Secondary | ICD-10-CM

## 2015-09-30 DIAGNOSIS — I428 Other cardiomyopathies: Secondary | ICD-10-CM

## 2015-09-30 LAB — HEPATIC FUNCTION PANEL
ALBUMIN: 4.5 g/dL (ref 3.6–5.1)
ALK PHOS: 45 U/L (ref 40–115)
ALT: 28 U/L (ref 9–46)
AST: 19 U/L (ref 10–35)
Bilirubin, Direct: 0.1 mg/dL (ref <=0.2)
Indirect Bilirubin: 0.5 mg/dL (ref 0.2–1.2)
TOTAL PROTEIN: 6.8 g/dL (ref 6.1–8.1)
Total Bilirubin: 0.6 mg/dL (ref 0.2–1.2)

## 2015-09-30 LAB — LIPID PANEL
Cholesterol: 108 mg/dL — ABNORMAL LOW (ref 125–200)
HDL: 47 mg/dL (ref >=40)
LDL Cholesterol: 42 mg/dL (ref <130)
Total CHOL/HDL Ratio: 2.3 Ratio (ref <=5.0)
Triglycerides: 93 mg/dL (ref <150)
VLDL: 19 mg/dL (ref <30)

## 2015-09-30 LAB — TSH: TSH: 1.21 m[IU]/L (ref 0.40–4.50)

## 2015-09-30 LAB — T3, FREE: T3, Free: 2.7 pg/mL (ref 2.3–4.2)

## 2015-09-30 LAB — T4, FREE: FREE T4: 1.3 ng/dL (ref 0.8–1.8)

## 2015-09-30 NOTE — Addendum Note (Signed)
Addended by: Tonita Phoenix on: 09/30/2015 07:36 AM   Modules accepted: Orders

## 2015-10-31 DIAGNOSIS — G4733 Obstructive sleep apnea (adult) (pediatric): Secondary | ICD-10-CM | POA: Diagnosis not present

## 2015-11-17 ENCOUNTER — Other Ambulatory Visit (INDEPENDENT_AMBULATORY_CARE_PROVIDER_SITE_OTHER): Payer: BLUE CROSS/BLUE SHIELD | Admitting: *Deleted

## 2015-11-17 DIAGNOSIS — I4891 Unspecified atrial fibrillation: Secondary | ICD-10-CM

## 2015-11-17 DIAGNOSIS — I5022 Chronic systolic (congestive) heart failure: Secondary | ICD-10-CM

## 2015-11-17 DIAGNOSIS — I480 Paroxysmal atrial fibrillation: Secondary | ICD-10-CM

## 2015-11-17 DIAGNOSIS — I5021 Acute systolic (congestive) heart failure: Secondary | ICD-10-CM

## 2015-11-17 DIAGNOSIS — I428 Other cardiomyopathies: Secondary | ICD-10-CM

## 2015-11-17 LAB — T4, FREE: Free T4: 1.3 ng/dL (ref 0.8–1.8)

## 2015-11-17 LAB — T3, FREE: T3 FREE: 2.4 pg/mL (ref 2.3–4.2)

## 2015-11-17 LAB — TSH: TSH: 1.3 m[IU]/L (ref 0.40–4.50)

## 2015-11-17 NOTE — Addendum Note (Signed)
Addended by: Tonita Phoenix on: 11/17/2015 02:57 PM   Modules accepted: Orders

## 2015-11-17 NOTE — Addendum Note (Signed)
Addended by: BOWDEN, ROBIN K on: 11/17/2015 02:57 PM   Modules accepted: Orders  

## 2015-11-24 ENCOUNTER — Encounter: Payer: Self-pay | Admitting: Cardiology

## 2015-11-24 ENCOUNTER — Ambulatory Visit (INDEPENDENT_AMBULATORY_CARE_PROVIDER_SITE_OTHER): Payer: BLUE CROSS/BLUE SHIELD | Admitting: Cardiology

## 2015-11-24 VITALS — BP 124/72 | HR 52 | Ht 70.0 in | Wt 261.2 lb

## 2015-11-24 DIAGNOSIS — I429 Cardiomyopathy, unspecified: Secondary | ICD-10-CM | POA: Diagnosis not present

## 2015-11-24 NOTE — Progress Notes (Signed)
Cardiology Office Note   Date:  11/24/2015   ID:  James [name], DOB October 20, 1955, MRN [id number]  PCP:  [name] PCP Per Patient  Cardiologist:   Rollene Rotunda, MD   Chief Complaint  Patient presents with  . Atrial Fibrillation      History of Present Illness: James [name] is a 60 y.o. male who presents for evaluation of atrial fibrillation. He was in the hospital in January with shortness of breath. He presented and was in atrial fibrillation. He subsequently needed TEE guided cardioversion. However, he did not convert to sinus rhythm with this and was treated with amiodarone. His second attempt at TEE cardioversion following this and this was successful. He was treated with IV diuresis. Of note he does have some renal insufficiency. He has a dilated cardiomyopathy with an EF of 20-25%. Stress testing demonstrated an inferior wall defect which may have been artifact or prior inferior infarct with scarring. However, there was [name] ischemia. He's been managed with med titration. Since he was last seen he's done quite well. He says he is breathing well. He doesn't feeling tachypalpitations. He doesn't have any presyncope or syncope. [name] chest discomfort, neck or arm discomfort. He has some mild lower extremity edema. He works all day and walks quite a bit and has [name] significant problems with this. He denies any PND or orthopnea. He's had [name] chest pressure, neck or arm discomfort. Of note he's had sleep apnea diagnosed and treated.  Past Medical History  Diagnosis Date  . Asthma   . Morbid obesity (HCC)   . Suspected sleep apnea   . Former tobacco use   . New onset atrial fibrillation (HCC) 07/2015  . Hypertension   . Non-ischemic cardiomyopathy [facility])     Past Surgical History  Procedure Laterality Date  . Mouth surgery    . Tee without cardioversion N/A 08/08/2015    Procedure: TRANSESOPHAGEAL ECHOCARDIOGRAM (TEE);  Surgeon: Lars Masson, MD;  Location: [facility] ENDOSCOPY;  Service:  Cardiovascular;  Laterality: N/A;  . Cardioversion N/A 08/08/2015    Procedure: CARDIOVERSION;  Surgeon: Lars Masson, MD;  Location: [facility] ENDOSCOPY;  Service: Cardiovascular;  Laterality: N/A;  . Cardioversion N/A 08/12/2015    Procedure: CARDIOVERSION;  Surgeon: Chilton Si, MD;  Location: [facility] ENDOSCOPY;  Service: Cardiovascular;  Laterality: N/A;     Current Outpatient Prescriptions  Medication Sig Dispense Refill  . amiodarone (PACERONE) 200 MG tablet Take 1 tablet (200 mg total) by mouth daily. 30 tablet 11  . atorvastatin (LIPITOR) 40 MG tablet Take 1 tablet (40 mg total) by mouth daily at 6 PM. 30 tablet 11  . carvedilol (COREG) 12.5 MG tablet Take 1 tablet (12.5 mg total) by mouth 2 (two) times daily with a meal. 60 tablet 11  . furosemide (LASIX) 40 MG tablet Take 1 tablet (40 mg total) by mouth daily. 90 tablet 1  . hydrALAZINE (APRESOLINE) 50 MG tablet Take 1 tablet (50 mg total) by mouth 2 (two) times daily. 60 tablet 11  . potassium chloride (K-DUR) 10 MEQ tablet Take 1 tablet (10 mEq total) by mouth daily. 90 tablet 1  . rivaroxaban (XARELTO) 20 MG TABS tablet Take 1 tablet (20 mg total) by mouth daily with supper. 30 tablet 11  . spironolactone (ALDACTONE) 25 MG tablet Take 1 tablet (25 mg total) by mouth daily. 30 tablet 11   [name] current facility-administered medications for this visit.    Allergies:   Review of patient's allergies indicates [name] known  allergies.     ROS:  Please see the history of present illness.   Otherwise, review of systems are positive for none.   All other systems are reviewed and negative.    PHYSICAL EXAM: VS:  BP 124/72 mmHg  Pulse 52  Ht 5\' 10"  (1.778 m)  Wt 261 lb 4 oz (118.502 kg)  BMI 37.49 kg/m2 , BMI Body mass index is 37.49 kg/(m^2). GENERAL:  Well appearing HEENT:  Pupils equal round and reactive, fundi not visualized, oral mucosa unremarkable NECK:  [name] jugular venous distention, waveform within normal limits, carotid upstroke  brisk and symmetric, [name] bruits, [name] thyromegaly LYMPHATICS:  [name] cervical, inguinal adenopathy LUNGS:  Clear to auscultation bilaterally BACK:  [name] CVA tenderness CHEST:  Unremarkable HEART:  PMI not displaced or sustained,S1 and S2 within normal limits, [name] S3, [name] S4, [name] clicks, [name] rubs, [name] murmurs ABD:  Flat, positive bowel sounds normal in frequency in pitch, [name] bruits, [name] rebound, [name] guarding, [name] midline pulsatile mass, [name] hepatomegaly, [name] splenomegaly EXT:  2 plus pulses throughout, mild edema, [name] cyanosis [name] clubbing     EKG:  EKG is not ordered today.   Recent Labs: 08/04/2015: B Natriuretic Peptide 214.6*; Magnesium 2.3 08/16/2015: BUN 24*; Creatinine, Ser 1.62*; Hemoglobin 15.4; Platelets 206; Potassium 4.7; Sodium 138 09/30/2015: ALT 28 11/17/2015: TSH 1.30    Lipid Panel    Component Value Date/Time   CHOL 108* 09/30/2015 0736   TRIG 93 09/30/2015 0736   HDL 47 09/30/2015 0736   CHOLHDL 2.3 09/30/2015 0736   VLDL 19 09/30/2015 0736   LDLCALC 42 09/30/2015 0736      Wt Readings from Last 3 Encounters:  11/24/15 261 lb 4 oz (118.502 kg)  09/19/15 259 lb (117.482 kg)  09/18/15 260 lb (117.935 kg)      Other studies Reviewed: Additional studies/ records that were reviewed today include:   Hospital records Review of the above records demonstrates:  Please see elsewhere in the note.     ASSESSMENT AND PLAN:  DILATED CARDIOMYOPATHY:  The patient is doing well. He can't tolerate med titration. Blood pressure runs low and I reviewed his blood pressure diary. He has renal insufficiency and doesn't tolerate an ACE inhibitor. He will remain on the meds as listed. I will plan to follow-up with an echocardiogram in July and see him back.  We did talk about salt and fluid restriction  ATRIAL FIB:  He is in sinus rhythm. I will continue with the meds as listed and plan follow-up liver and TSH when I see him back.   Current medicines are reviewed at length with the patient today.   The patient does not have concerns regarding medicines.  The following changes have been made:  [name] change  Labs/ tests ordered today include:   Orders Placed This Encounter  Procedures  . ECHOCARDIOGRAM COMPLETE     Disposition:   FU with me in July    Signed, Darrly Jishnu Jenniges, MD  11/24/2015 5:19 PM    [facility]

## 2015-11-24 NOTE — Patient Instructions (Signed)
Your physician recommends that you schedule a follow-up appointment in: July same day as Echo  Your physician has requested that you have an echocardiogram in July. Echocardiography is a painless test that uses sound waves to create images of your heart. It provides your doctor with information about the size and shape of your heart and how well your heart's chambers and valves are working. This procedure takes approximately one hour. There are no restrictions for this procedure.

## 2015-11-25 ENCOUNTER — Telehealth: Payer: Self-pay | Admitting: Cardiology

## 2015-11-25 NOTE — Telephone Encounter (Signed)
I callled the patient and left a voicemail regarding scheduling his echo and office visit with Dr. Antoine Poche in July of 2017.

## 2015-11-29 ENCOUNTER — Ambulatory Visit: Payer: BLUE CROSS/BLUE SHIELD | Admitting: Cardiology

## 2015-11-30 DIAGNOSIS — G4733 Obstructive sleep apnea (adult) (pediatric): Secondary | ICD-10-CM | POA: Diagnosis not present

## 2015-12-23 ENCOUNTER — Ambulatory Visit (INDEPENDENT_AMBULATORY_CARE_PROVIDER_SITE_OTHER): Payer: BLUE CROSS/BLUE SHIELD | Admitting: Cardiology

## 2015-12-23 ENCOUNTER — Encounter: Payer: Self-pay | Admitting: Cardiology

## 2015-12-23 VITALS — BP 124/52 | HR 53 | Ht 70.0 in | Wt 257.8 lb

## 2015-12-23 DIAGNOSIS — I10 Essential (primary) hypertension: Secondary | ICD-10-CM

## 2015-12-23 DIAGNOSIS — G4733 Obstructive sleep apnea (adult) (pediatric): Secondary | ICD-10-CM | POA: Diagnosis not present

## 2015-12-23 NOTE — Patient Instructions (Signed)
Medication Instructions:  Your physician recommends that you continue on your current medications as directed. Please refer to the Current Medication list given to you today.   Labwork: None  Testing/Procedures: None  Follow-Up: Your physician wants you to follow-up in: 6 months with Dr. Mayford Knife. You will receive a reminder letter in the mail two months in advance. If you don't receive a letter, please call our office to schedule the follow-up appointment.   Any Other Special Instructions Will Be Listed Below (If Applicable). We have ordered for you a new mask. [facility] will be in touch with you in the next couple days.    If you need a refill on your cardiac medications before your next appointment, please call your pharmacy.

## 2015-12-23 NOTE — Progress Notes (Signed)
Cardiology Office Note    Date:  12/23/2015   ID:  James [name], DOB 14-Oct-1955, MRN [id number]  PCP:  No PCP Per Patient  Cardiologist:  Armanda Magic, MD   Chief Complaint  Patient presents with  . Sleep Apnea  . Hypertension    History of Present Illness:  James [name] is a 60 y.o. male who presents today for evaluation of OSA.  He was recently referred by Dr. Antoine Poche for a sleep study due to excessive daytime fatigue, snoring and witnessed apnea.  His Epworth sleepiness score was elevated at 10.  He was found to have severe OSA with an AHI of 49/hr with moderate oxygen desaturations as low as 84%.  He underwent successful CPAP titration to 16cm H2O.  He is doing well with his CPAP device.  He tolerates his full face mask but the mask moves around a lot.  He would like to try the nasal pillow mask with chin strap.  He feels the pressure is adequate.  Since going on CPAP he feels more refreshed in the am and has less daytime sleepiness.  He has had a lot of mouth dryness and tongue is stuck to his teeth in the am.     Past Medical History  Diagnosis Date  . Asthma   . Morbid obesity (HCC)   . Suspected sleep apnea   . Former tobacco use   . New onset atrial fibrillation (HCC) 07/2015  . Hypertension   . Non-ischemic cardiomyopathy [facility])     Past Surgical History  Procedure Laterality Date  . Mouth surgery    . Tee without cardioversion N/A 08/08/2015    Procedure: TRANSESOPHAGEAL ECHOCARDIOGRAM (TEE);  Surgeon: Lars Masson, MD;  Location: [facility] ENDOSCOPY;  Service: Cardiovascular;  Laterality: N/A;  . Cardioversion N/A 08/08/2015    Procedure: CARDIOVERSION;  Surgeon: Lars Masson, MD;  Location: [facility] ENDOSCOPY;  Service: Cardiovascular;  Laterality: N/A;  . Cardioversion N/A 08/12/2015    Procedure: CARDIOVERSION;  Surgeon: Chilton Si, MD;  Location: [facility] ENDOSCOPY;  Service: Cardiovascular;  Laterality: N/A;    Current Medications: Outpatient Prescriptions Prior  to Visit  Medication Sig Dispense Refill  . amiodarone (PACERONE) 200 MG tablet Take 1 tablet (200 mg total) by mouth daily. 30 tablet 11  . atorvastatin (LIPITOR) 40 MG tablet Take 1 tablet (40 mg total) by mouth daily at 6 PM. 30 tablet 11  . carvedilol (COREG) 12.5 MG tablet Take 1 tablet (12.5 mg total) by mouth 2 (two) times daily with a meal. 60 tablet 11  . furosemide (LASIX) 40 MG tablet Take 1 tablet (40 mg total) by mouth daily. 90 tablet 1  . hydrALAZINE (APRESOLINE) 50 MG tablet Take 1 tablet (50 mg total) by mouth 2 (two) times daily. 60 tablet 11  . potassium chloride (K-DUR) 10 MEQ tablet Take 1 tablet (10 mEq total) by mouth daily. 90 tablet 1  . rivaroxaban (XARELTO) 20 MG TABS tablet Take 1 tablet (20 mg total) by mouth daily with supper. 30 tablet 11  . spironolactone (ALDACTONE) 25 MG tablet Take 1 tablet (25 mg total) by mouth daily. 30 tablet 11   No facility-administered medications prior to visit.     Allergies:   Review of patient's allergies indicates no known allergies.   Social History   Social History  . Marital Status: Single    Spouse Name: N/A  . Number of Children: N/A  . Years of Education: N/A   Occupational History  .  Machinist    Social History Main Topics  . Smoking status: Former Games developer  . Smokeless tobacco: Never Used     Comment: Smoked for 20 years, quit ~1995  . Alcohol Use: No  . Drug Use: No  . Sexual Activity: Not Asked   Other Topics Concern  . None   Social History Narrative     Family History:  The patient's family history includes CAD in his maternal aunt and mother.   ROS:   Please see the history of present illness.    ROS All other systems reviewed and are negative.   PHYSICAL EXAM:   VS:  BP 124/52 mmHg  Pulse 53  Ht  (1.778 m)  Wt 257 lb 12.8 oz (116.937 kg)  BMI 36.99 kg/m2  SpO2 96%   GEN: Well nourished, well developed, in no acute distress HEENT: normal Neck: no JVD, carotid bruits, or  masses Cardiac: RRR; no murmurs, rubs, or gallops,no edema.  Intact distal pulses bilaterally.  Respiratory:  clear to auscultation bilaterally, normal work of breathing GI: soft, nontender, nondistended, + BS MS: no deformity or atrophy Skin: warm and dry, no rash Neuro:  Alert and Oriented x 3, Strength and sensation are intact Psych: euthymic mood, full affect  Wt Readings from Last 3 Encounters:  12/23/15 257 lb 12.8 oz (116.937 kg)  11/24/15 261 lb 4 oz (118.502 kg)  09/19/15 259 lb (117.482 kg)      Studies/Labs Reviewed:   EKG:  EKG is not ordered today.   Recent Labs: 08/04/2015: B Natriuretic Peptide 214.6*; Magnesium 2.3 08/16/2015: BUN 24*; Creatinine, Ser 1.62*; Hemoglobin 15.4; Platelets 206; Potassium 4.7; Sodium 138 09/30/2015: ALT 28 11/17/2015: TSH 1.30   Lipid Panel    Component Value Date/Time   CHOL 108* 09/30/2015 0736   TRIG 93 09/30/2015 0736   HDL 47 09/30/2015 0736   CHOLHDL 2.3 09/30/2015 0736   VLDL 19 09/30/2015 0736   LDLCALC 42 09/30/2015 0736    Additional studies/ records that were reviewed today include:  Sleep study    ASSESSMENT:    1. OSA (obstructive sleep apnea)   2. Essential hypertension   3. Morbid obesity, unspecified obesity type (HCC)      PLAN:  In order of problems listed above:  1.  OSA - the patient is tolerating PAP therapy well without any problems. The PAP download was reviewed today and showed an AHI of 3/hr on 16 cm H2O with 87% compliance in using more than 4 hours nightly.  The patient has been using and benefiting from CPAP use and will continue to benefit from therapy.  I have also instructed the patient on proper sleep hygiene, avoidance of sleeping in the supine position and avoidance of alcohol within 4 hours of bedtime.  The patient was also instructed to avoid driiing if sleepy.   2.  HTN - BP controlled on current regimen.  Continue on BB/Hydralazine/aldactone 3.  Obesity - I have encouraged him to get  into a routine exercise program and cut back on carbs and portions.      Medication Adjustments/Labs and Tests Ordered: Current medicines are reviewed at length with the patient today.  Concerns regarding medicines are outlined above.  Medication changes, Labs and Tests ordered today are listed in the Patient Instructions below.  There are no Patient Instructions on file for this visit.   Signed, Armanda Magic, MD  12/23/2015 3:35 PM    [facility] [address]  [address], [place], [place]  [address] Phone: [phone]; Fax: [phone]

## 2015-12-31 DIAGNOSIS — G4733 Obstructive sleep apnea (adult) (pediatric): Secondary | ICD-10-CM | POA: Diagnosis not present

## 2016-01-20 DIAGNOSIS — G4733 Obstructive sleep apnea (adult) (pediatric): Secondary | ICD-10-CM | POA: Diagnosis not present

## 2016-02-19 NOTE — Progress Notes (Signed)
Cardiology Office Note   Date:  02/20/2016   ID:  James [name], DOB [birth year], MRN [id number]  PCP:  No PCP Per Patient  Cardiologist:   Rollene Rotunda, MD   Chief Complaint  Patient presents with  . Cardiomyopathy      History of Present Illness: James [name] is a 60 y.o. male who presents for evaluation of atrial fibrillation. He was in the hospital in January with shortness of breath. He presented and was in atrial fibrillation. He subsequently needed TEE guided cardioversion. However, he did not convert to sinus rhythm with this and was treated with amiodarone. His second attempt at TEE cardioversion following this was successful. He was treated with IV diuresis. Of note he does have some renal insufficiency. He has a dilated cardiomyopathy with an EF of 20-25%. Stress testing demonstrated an inferior wall defect which may have been artifact or prior inferior infarct with scarring. However, there was no ischemia. He's been managed with med titration.   I last saw him in April and he was doing well.  He did see Dr. Mayford Knife for treatment of sleep apnea.    Since I last saw him he has felt much better. He thinks treatment of his sleep apnea feel much better during the day. He doesn't both sleep is easily. He denies any shortness of breath, PND or orthopnea. He's had no chest pressure, neck or arm discomfort. He's had no palpitations, presyncope or syncope. He's had no weight gain or edema. He tolerates his medications without problems.   Past Medical History:  Diagnosis Date  . Asthma   . Former tobacco use   . Hypertension   . Morbid obesity (HCC)   . New onset atrial fibrillation (HCC) 07/2015  . Non-ischemic cardiomyopathy (HCC)   . Suspected sleep apnea     Past Surgical History:  Procedure Laterality Date  . CARDIOVERSION N/A 08/08/2015   Procedure: CARDIOVERSION;  Surgeon: Lars Masson, MD;  Location: [facility] ENDOSCOPY;  Service: Cardiovascular;  Laterality: N/A;  .  CARDIOVERSION N/A 08/12/2015   Procedure: CARDIOVERSION;  Surgeon: Chilton Si, MD;  Location: [facility] ENDOSCOPY;  Service: Cardiovascular;  Laterality: N/A;  . MOUTH SURGERY    . TEE WITHOUT CARDIOVERSION N/A 08/08/2015   Procedure: TRANSESOPHAGEAL ECHOCARDIOGRAM (TEE);  Surgeon: Lars Masson, MD;  Location: [facility] ENDOSCOPY;  Service: Cardiovascular;  Laterality: N/A;     Current Outpatient Prescriptions  Medication Sig Dispense Refill  . amiodarone (PACERONE) 200 MG tablet Take 1 tablet (200 mg total) by mouth daily. 30 tablet 11  . atorvastatin (LIPITOR) 40 MG tablet Take 1 tablet (40 mg total) by mouth daily at 6 PM. 30 tablet 11  . carvedilol (COREG) 12.5 MG tablet Take 1 tablet (12.5 mg total) by mouth 2 (two) times daily with a meal. 60 tablet 11  . furosemide (LASIX) 40 MG tablet Take 1 tablet (40 mg total) by mouth daily. 90 tablet 1  . hydrALAZINE (APRESOLINE) 50 MG tablet Take 1 tablet (50 mg total) by mouth 2 (two) times daily. 60 tablet 11  . MAGNESIUM PO Take 1 tablet by mouth daily.    . potassium chloride (K-DUR) 10 MEQ tablet Take 1 tablet (10 mEq total) by mouth daily. 90 tablet 1  . rivaroxaban (XARELTO) 20 MG TABS tablet Take 1 tablet (20 mg total) by mouth daily with supper. 30 tablet 11  . spironolactone (ALDACTONE) 25 MG tablet Take 1 tablet (25 mg total) by mouth daily. 30 tablet 11  No current facility-administered medications for this visit.     Allergies:   Review of patient's allergies indicates no known allergies.     ROS:  Please see the history of present illness.   Otherwise, review of systems are positive for none.   All other systems are reviewed and negative.    PHYSICAL EXAM: VS:  BP 118/64 (BP Location: Right Arm, Patient Position: Sitting, Cuff Size: Normal)   Pulse (!) 48   Ht 5\' 9"  (1.753 m)   Wt 246 lb 12.8 oz (111.9 kg)   SpO2 98%   BMI 36.45 kg/m  , BMI Body mass index is 36.45 kg/m. GENERAL:  Well appearing HEENT:  Pupils equal round  and reactive, fundi not visualized, oral mucosa unremarkable NECK:  No jugular venous distention, waveform within normal limits, carotid upstroke brisk and symmetric, no bruits, no thyromegaly LYMPHATICS:  No cervical, inguinal adenopathy LUNGS:  Clear to auscultation bilaterally BACK:  No CVA tenderness CHEST:  Unremarkable HEART:  PMI not displaced or sustained,S1 and S2 within normal limits, no S3, no S4, no clicks, no rubs, no murmurs ABD:  Flat, positive bowel sounds normal in frequency in pitch, no bruits, no rebound, no guarding, no midline pulsatile mass, no hepatomegaly, no splenomegaly EXT:  2 plus pulses throughout, mild edema, no cyanosis no clubbing     EKG:  EKG is not ordered today.   Recent Labs: 08/04/2015: B Natriuretic Peptide 214.6; Magnesium 2.3 08/16/2015: BUN 24; Creatinine, Ser 1.62; Hemoglobin 15.4; Platelets 206; Potassium 4.7; Sodium 138 09/30/2015: ALT 28 11/17/2015: TSH 1.30    Lipid Panel    Component Value Date/Time   CHOL 108 (L) 09/30/2015 0736   TRIG 93 09/30/2015 0736   HDL 47 09/30/2015 0736   CHOLHDL 2.3 09/30/2015 0736   VLDL 19 09/30/2015 0736   LDLCALC 42 09/30/2015 0736     Lab Results  Component Value Date   TSH 1.30 11/17/2015   ALT 28 09/30/2015   AST 19 09/30/2015   ALKPHOS 45 09/30/2015   BILITOT 0.6 09/30/2015   PROT 6.8 09/30/2015   ALBUMIN 4.5 09/30/2015     Wt Readings from Last 3 Encounters:  02/20/16 246 lb 12.8 oz (111.9 kg)  12/23/15 257 lb 12.8 oz (116.9 kg)  11/24/15 261 lb 4 oz (118.5 kg)      Other studies Reviewed: Additional studies/ records that were reviewed today include:   None Review of the above records demonstrates:     ASSESSMENT AND PLAN:  DILATED CARDIOMYOPATHY:  I reviewed the echo images personally today. His ejection fraction now appears to be low normal and much improved. He will continue the meds as listed. He will check a basic metabolic profile today.  ATRIAL FIB:  He is up to date with  labs for the amiodarone.  I will check a CBC.  The patient  tolerates this rhythm and rate control and anticoagulation. We will continue with the meds as listed.   In the future, we might be able to reduce or stop his amiodarone.     Current medicines are reviewed at length with the patient today.  The patient does not have concerns regarding medicines.  The following changes have been made:  no change  Labs/ tests ordered today include:   Orders Placed This Encounter  Procedures  . Basic Metabolic Panel (BMET)  . CBC     Disposition:   FU with me in six months.    Signed, Rollene Rotunda, MD  02/20/2016 11:37 AM    [facility]

## 2016-02-20 ENCOUNTER — Ambulatory Visit (HOSPITAL_COMMUNITY): Payer: BLUE CROSS/BLUE SHIELD | Attending: Internal Medicine

## 2016-02-20 ENCOUNTER — Encounter: Payer: Self-pay | Admitting: Cardiology

## 2016-02-20 ENCOUNTER — Ambulatory Visit (INDEPENDENT_AMBULATORY_CARE_PROVIDER_SITE_OTHER): Payer: BLUE CROSS/BLUE SHIELD | Admitting: Cardiology

## 2016-02-20 ENCOUNTER — Other Ambulatory Visit (HOSPITAL_COMMUNITY): Payer: Self-pay

## 2016-02-20 VITALS — BP 118/64 | HR 48 | Ht 69.0 in | Wt 246.8 lb

## 2016-02-20 DIAGNOSIS — I10 Essential (primary) hypertension: Secondary | ICD-10-CM | POA: Diagnosis not present

## 2016-02-20 DIAGNOSIS — I428 Other cardiomyopathies: Secondary | ICD-10-CM | POA: Diagnosis not present

## 2016-02-20 DIAGNOSIS — E669 Obesity, unspecified: Secondary | ICD-10-CM | POA: Insufficient documentation

## 2016-02-20 DIAGNOSIS — Z8249 Family history of ischemic heart disease and other diseases of the circulatory system: Secondary | ICD-10-CM | POA: Diagnosis not present

## 2016-02-20 DIAGNOSIS — G4733 Obstructive sleep apnea (adult) (pediatric): Secondary | ICD-10-CM | POA: Diagnosis not present

## 2016-02-20 DIAGNOSIS — Z87891 Personal history of nicotine dependence: Secondary | ICD-10-CM | POA: Diagnosis not present

## 2016-02-20 DIAGNOSIS — I429 Cardiomyopathy, unspecified: Secondary | ICD-10-CM | POA: Diagnosis not present

## 2016-02-20 DIAGNOSIS — Z6837 Body mass index (BMI) 37.0-37.9, adult: Secondary | ICD-10-CM | POA: Diagnosis not present

## 2016-02-20 DIAGNOSIS — I480 Paroxysmal atrial fibrillation: Secondary | ICD-10-CM

## 2016-02-20 DIAGNOSIS — Z79899 Other long term (current) drug therapy: Secondary | ICD-10-CM | POA: Diagnosis not present

## 2016-02-20 LAB — CBC
HEMATOCRIT: 45.1 % (ref 38.5–50.0)
HEMOGLOBIN: 15 g/dL (ref 13.2–17.1)
MCH: 31.2 pg (ref 27.0–33.0)
MCHC: 33.3 g/dL (ref 32.0–36.0)
MCV: 93.8 fL (ref 80.0–100.0)
MPV: 12.1 fL (ref 7.5–12.5)
Platelets: 184 10*3/uL (ref 140–400)
RBC: 4.81 MIL/uL (ref 4.20–5.80)
RDW: 13.5 % (ref 11.0–15.0)
WBC: 6.7 10*3/uL (ref 3.8–10.8)

## 2016-02-20 LAB — BASIC METABOLIC PANEL
BUN: 19 mg/dL (ref 7–25)
CALCIUM: 8.8 mg/dL (ref 8.6–10.3)
CO2: 27 mmol/L (ref 20–31)
CREATININE: 1.24 mg/dL (ref 0.70–1.33)
Chloride: 105 mmol/L (ref 98–110)
GLUCOSE: 116 mg/dL — AB (ref 65–99)
Potassium: 4.6 mmol/L (ref 3.5–5.3)
SODIUM: 139 mmol/L (ref 135–146)

## 2016-02-20 NOTE — Patient Instructions (Signed)
Medication Instructions:  Continue current medications  Labwork: CBC and BMP  Testing/Procedures: NONE  Follow-Up: Your physician recommends that you schedule a follow-up appointment in: January 2018   Any Other Special Instructions Will Be Listed Below (If Applicable).   If you need a refill on your cardiac medications before your next appointment, please call your pharmacy.

## 2016-02-28 DIAGNOSIS — H524 Presbyopia: Secondary | ICD-10-CM | POA: Diagnosis not present

## 2016-02-28 DIAGNOSIS — H04123 Dry eye syndrome of bilateral lacrimal glands: Secondary | ICD-10-CM | POA: Diagnosis not present

## 2016-02-28 DIAGNOSIS — H2513 Age-related nuclear cataract, bilateral: Secondary | ICD-10-CM | POA: Diagnosis not present

## 2016-02-28 DIAGNOSIS — H35033 Hypertensive retinopathy, bilateral: Secondary | ICD-10-CM | POA: Diagnosis not present

## 2016-02-28 DIAGNOSIS — H35371 Puckering of macula, right eye: Secondary | ICD-10-CM | POA: Diagnosis not present

## 2016-02-28 DIAGNOSIS — I10 Essential (primary) hypertension: Secondary | ICD-10-CM | POA: Diagnosis not present

## 2016-03-16 DIAGNOSIS — G4733 Obstructive sleep apnea (adult) (pediatric): Secondary | ICD-10-CM | POA: Diagnosis not present

## 2016-03-19 ENCOUNTER — Other Ambulatory Visit: Payer: Self-pay | Admitting: *Deleted

## 2016-03-19 DIAGNOSIS — I428 Other cardiomyopathies: Secondary | ICD-10-CM

## 2016-03-19 DIAGNOSIS — I4891 Unspecified atrial fibrillation: Secondary | ICD-10-CM

## 2016-03-19 MED ORDER — POTASSIUM CHLORIDE ER 10 MEQ PO TBCR: 10.0000 meq | EXTENDED_RELEASE_TABLET | Freq: Every day | ORAL | 1 refills | Status: DC

## 2016-05-14 DIAGNOSIS — G4733 Obstructive sleep apnea (adult) (pediatric): Secondary | ICD-10-CM | POA: Diagnosis not present

## 2016-07-10 ENCOUNTER — Ambulatory Visit (INDEPENDENT_AMBULATORY_CARE_PROVIDER_SITE_OTHER): Payer: BLUE CROSS/BLUE SHIELD | Admitting: Cardiology

## 2016-07-10 VITALS — BP 108/60 | HR 50 | Ht 69.0 in | Wt 227.8 lb

## 2016-07-10 DIAGNOSIS — I10 Essential (primary) hypertension: Secondary | ICD-10-CM | POA: Diagnosis not present

## 2016-07-10 DIAGNOSIS — G4733 Obstructive sleep apnea (adult) (pediatric): Secondary | ICD-10-CM | POA: Diagnosis not present

## 2016-07-10 NOTE — Patient Instructions (Signed)
Medication Instructions:  Your physician recommends that you continue on your current medications as directed. Please refer to the Current Medication list given to you today.   Labwork: None  Testing/Procedures: Dr. Mayford Knife has ordered for you a 2 week PAP auto-titration.  Follow-Up: Your physician wants you to follow-up in: 1 year with Dr. Mayford Knife. You will receive a reminder letter in the mail two months in advance. If you don't receive a letter, please call our office to schedule the follow-up appointment.   Any Other Special Instructions Will Be Listed Below (If Applicable).     If you need a refill on your cardiac medications before your next appointment, please call your pharmacy.

## 2016-07-10 NOTE — Progress Notes (Signed)
Cardiology Office Note    Date:  07/10/2016   ID:  James [name], DOB 15-Jan-1956, MRN [id number]  PCP:  No PCP Per Patient  Cardiologist:  Armanda Magicraci Halo Shevlin, MD   Chief Complaint  Patient presents with  . Sleep Apnea  . Hypertension    History of Present Illness:  James [name] is a 60 y.o. male who presents today for followup of OSA.  He has severe OSA with an AHI of 49/hr with moderate oxygen desaturations as low as 84%.  He is on CPAP at 16cm H2O.  He is doing well with his CPAP device.  He tolerates his full face mask but feels the pressure is too high.  Since going on CPAP he feels more refreshed in the am and has less daytime sleepiness.  He has lost 30lbs in the past 6 months.    Past Medical History:  Diagnosis Date  . Asthma   . Former tobacco use   . Hypertension   . Morbid obesity (HCC)   . New onset atrial fibrillation (HCC) 07/2015  . Non-ischemic cardiomyopathy (HCC)   . Suspected sleep apnea     Past Surgical History:  Procedure Laterality Date  . CARDIOVERSION N/A 08/08/2015   Procedure: CARDIOVERSION;  Surgeon: Lars MassonKatarina H Nelson, MD;  [facility];  Service: Cardiovascular;  Laterality: N/A;  . CARDIOVERSION N/A 08/12/2015   Procedure: CARDIOVERSION;  Surgeon: Chilton Siiffany Jetmore, MD;  [facility];  Service: Cardiovascular;  Laterality: N/A;  . MOUTH SURGERY    . TEE WITHOUT CARDIOVERSION N/A 08/08/2015   Procedure: TRANSESOPHAGEAL ECHOCARDIOGRAM (TEE);  Surgeon: Lars MassonKatarina H Nelson, MD;  [facility];  Service: Cardiovascular;  Laterality: N/A;    Current Medications: Outpatient Medications Prior to Visit  Medication Sig Dispense Refill  . amiodarone (PACERONE) 200 MG tablet Take 1 tablet (200 mg total) by mouth daily. 30 tablet 11  . atorvastatin (LIPITOR) 40 MG tablet Take 1 tablet (40 mg total) by mouth daily at 6 PM. 30 tablet 11  . carvedilol (COREG) 12.5 MG tablet Take 1 tablet (12.5 mg total) by mouth 2 (two) times daily with a  meal. 60 tablet 11  . furosemide (LASIX) 40 MG tablet Take 1 tablet (40 mg total) by mouth daily. 90 tablet 1  . hydrALAZINE (APRESOLINE) 50 MG tablet Take 1 tablet (50 mg total) by mouth 2 (two) times daily. 60 tablet 11  . MAGNESIUM PO Take 1 tablet by mouth daily.    . potassium chloride (K-DUR) 10 MEQ tablet Take 1 tablet (10 mEq total) by mouth daily. 90 tablet 1  . rivaroxaban (XARELTO) 20 MG TABS tablet Take 1 tablet (20 mg total) by mouth daily with supper. 30 tablet 11  . spironolactone (ALDACTONE) 25 MG tablet Take 1 tablet (25 mg total) by mouth daily. 30 tablet 11   No facility-administered medications prior to visit.      Allergies:   Patient has no known allergies.   Social History   Social History  . Marital status: Single    Spouse name: N/A  . Number of children: N/A  . Years of education: N/A   Occupational History  . Machinist    Social History Main Topics  . Smoking status: Former Games developermoker  . Smokeless tobacco: Never Used     Comment: Smoked for 20 years, quit ~1995  . Alcohol use No  . Drug use: No  . Sexual activity: Not on file   Other Topics Concern  . Not on  file   Social History Narrative  . No narrative on file     Family History:  The patient's family history includes CAD in his maternal aunt and mother.   ROS:   Please see the history of present illness.    ROS All other systems reviewed and are negative.  No flowsheet data found.     PHYSICAL EXAM:   VS:  BP 108/60   Pulse (!) 50   Ht 5\' 9"  (1.753 m)   Wt 227 lb 12.8 oz (103.3 kg)   BMI 33.64 kg/m    GEN: Well nourished, well developed, in no acute distress  HEENT: normal  Neck: no JVD, carotid bruits, or masses Cardiac: RRR; no murmurs, rubs, or gallops,no edema.  Intact distal pulses bilaterally.  Respiratory:  clear to auscultation bilaterally, normal work of breathing GI: soft, nontender, nondistended, + BS MS: no deformity or atrophy  Skin: warm and dry, no rash Neuro:   Alert and Oriented x 3, Strength and sensation are intact Psych: euthymic mood, full affect  Wt Readings from Last 3 Encounters:  07/10/16 227 lb 12.8 oz (103.3 kg)  02/20/16 246 lb 12.8 oz (111.9 kg)  12/23/15 257 lb 12.8 oz (116.9 kg)      Studies/Labs Reviewed:   EKG:  EKG is not ordered today.  Recent Labs: 08/04/2015: B Natriuretic Peptide 214.6; Magnesium 2.3 09/30/2015: ALT 28 11/17/2015: TSH 1.30 02/20/2016: BUN 19; Creat 1.24; Hemoglobin 15.0; Platelets 184; Potassium 4.6; Sodium 139   Lipid Panel    Component Value Date/Time   CHOL 108 (L) 09/30/2015 0736   TRIG 93 09/30/2015 0736   HDL 47 09/30/2015 0736   CHOLHDL 2.3 09/30/2015 0736   VLDL 19 09/30/2015 0736   LDLCALC 42 09/30/2015 0736    Additional studies/ records that were reviewed today include:  CPAP download    ASSESSMENT:    1. OSA (obstructive sleep apnea)   2. Essential hypertension      PLAN:  In order of problems listed above:  OSA - the patient is tolerating PAP therapy well without any problems. The PAP download was reviewed today and showed an AHI of 1/hr on 16 cm H2O with 37% compliance in using more than 4 hours nightly.  The patient has been using and benefiting from CPAP use and will continue to benefit from therapy. His AHI is low and he feels the pressure is too high.  He has lost 30lbs so I will get a 2 week autotitration from 4 to 18cm H2O.   HTN - BP controlled on current meds.  Continue amlodipine/hydralazine/BB. Obesity - he has lost 30lbs in the past 6 months and I congratulated him!.  I encouraged him to continue with his diet and exercise program.       Medication Adjustments/Labs and Tests Ordered: Current medicines are reviewed at length with the patient today.  Concerns regarding medicines are outlined above.  Medication changes, Labs and Tests ordered today are listed in the Patient Instructions below.  There are no Patient Instructions on file for this  visit.   Signed, Armanda Magic, MD  07/10/2016 8:35 AM    [facility] [address] [place], [place], [place]  [address] Phone: [phone]; Fax: [phone]

## 2016-08-18 NOTE — Progress Notes (Signed)
Cardiology Office Note   Date:  08/20/2016   ID:  James [name], DOB February 06, 1956, MRN [id number]  PCP:  No PCP Per Patient  Cardiologist:   Rollene Rotunda, MD   No chief complaint on file.     History of Present Illness: Julyan Gales is a 61 y.o. male who presents for evaluation of atrial fibrillation. He was in the hospital in January of last year with shortness of breath. He presented and was in atrial fibrillation. He subsequently needed TEE guided cardioversion. However, he did not convert to sinus rhythm with this and was treated with amiodarone. His second attempt at TEE cardioversion following this was successful. He was treated with IV diuresis. Of note he does have some renal insufficiency. He has a dilated cardiomyopathy with an EF of 20-25%. Stress testing demonstrated an inferior wall defect which may have been artifact or prior inferior infarct with scarring. However, there was no ischemia. He's been managed with med titration. His last ejection fraction in July was back up to 60%. He returns for follow up.  He's been a tremendous job losing weight. He's having sleep apnea treated by Dr. Mayford Knife. He feels well. The patient denies any new symptoms such as chest discomfort, neck or arm discomfort. There has been no new shortness of breath, PND or orthopnea. There have been no reported palpitations, presyncope or syncope.   Past Medical History:  Diagnosis Date  . Asthma   . Former tobacco use   . Hypertension   . Morbid obesity (HCC)   . New onset atrial fibrillation (HCC) 07/2015  . Non-ischemic cardiomyopathy (HCC)   . Suspected sleep apnea     Past Surgical History:  Procedure Laterality Date  . CARDIOVERSION N/A 08/08/2015   Procedure: CARDIOVERSION;  Surgeon: Lars Masson, MD;  Location: [facility] ENDOSCOPY;  Service: Cardiovascular;  Laterality: N/A;  . CARDIOVERSION N/A 08/12/2015   Procedure: CARDIOVERSION;  Surgeon: Chilton Si, MD;  Location: [facility] ENDOSCOPY;   Service: Cardiovascular;  Laterality: N/A;  . MOUTH SURGERY    . TEE WITHOUT CARDIOVERSION N/A 08/08/2015   Procedure: TRANSESOPHAGEAL ECHOCARDIOGRAM (TEE);  Surgeon: Lars Masson, MD;  Location: [facility] ENDOSCOPY;  Service: Cardiovascular;  Laterality: N/A;     Current Outpatient Prescriptions  Medication Sig Dispense Refill  . atorvastatin (LIPITOR) 40 MG tablet Take 1 tablet (40 mg total) by mouth daily at 6 PM. 30 tablet 11  . carvedilol (COREG) 12.5 MG tablet Take 1 tablet (12.5 mg total) by mouth 2 (two) times daily with a meal. 60 tablet 11  . furosemide (LASIX) 20 MG tablet Take 1 tablet (20 mg total) by mouth daily. 90 tablet 2  . hydrALAZINE (APRESOLINE) 50 MG tablet Take 1 tablet (50 mg total) by mouth 2 (two) times daily. 60 tablet 11  . MAGNESIUM PO Take 1 tablet by mouth daily.    . potassium chloride (K-DUR) 10 MEQ tablet Take 1 tablet (10 mEq total) by mouth daily. 90 tablet 1  . rivaroxaban (XARELTO) 20 MG TABS tablet Take 1 tablet (20 mg total) by mouth daily with supper. 30 tablet 11  . spironolactone (ALDACTONE) 25 MG tablet Take 1 tablet (25 mg total) by mouth daily. 30 tablet 11   No current facility-administered medications for this visit.     Allergies:   Patient has no known allergies.     ROS:  Please see the history of present illness.   Otherwise, review of systems are positive for none.   All  other systems are reviewed and negative.    PHYSICAL EXAM: VS:  BP 127/76 (BP Location: Left Arm, Patient Position: Sitting, Cuff Size: Normal)   Pulse (!) 50   Ht 5\' 9"  (1.753 m)   Wt 217 lb 6 oz (98.6 kg)   BMI 32.10 kg/m  , BMI Body mass index is 32.1 kg/m. GENERAL:  Well appearing HEENT:  Pupils equal round and reactive, fundi not visualized, oral mucosa unremarkable NECK:  No jugular venous distention, waveform within normal limits, carotid upstroke brisk and symmetric, no bruits, no thyromegaly LYMPHATICS:  No cervical, inguinal adenopathy LUNGS:  Clear to  auscultation bilaterally BACK:  No CVA tenderness CHEST:  Unremarkable HEART:  PMI not displaced or sustained,S1 and S2 within normal limits, no S3, no S4, no clicks, no rubs, no murmurs ABD:  Flat, positive bowel sounds normal in frequency in pitch, no bruits, no rebound, no guarding, no midline pulsatile mass, no hepatomegaly, no splenomegaly EXT:  2 plus pulses throughout, mild edema, no cyanosis no clubbing   EKG:  EKG is not ordered today.    Recent Labs: 09/30/2015: ALT 28 11/17/2015: TSH 1.30 02/20/2016: BUN 19; Creat 1.24; Hemoglobin 15.0; Platelets 184; Potassium 4.6; Sodium 139    Lipid Panel    Component Value Date/Time   CHOL 108 (L) 09/30/2015 0736   TRIG 93 09/30/2015 0736   HDL 47 09/30/2015 0736   CHOLHDL 2.3 09/30/2015 0736   VLDL 19 09/30/2015 0736   LDLCALC 42 09/30/2015 0736     Lab Results  Component Value Date   TSH 1.30 11/17/2015   ALT 28 09/30/2015   AST 19 09/30/2015   ALKPHOS 45 09/30/2015   BILITOT 0.6 09/30/2015   PROT 6.8 09/30/2015   ALBUMIN 4.5 09/30/2015     Wt Readings from Last 3 Encounters:  08/20/16 217 lb 6 oz (98.6 kg)  07/10/16 227 lb 12.8 oz (103.3 kg)  02/20/16 246 lb 12.8 oz (111.9 kg)      Other studies Reviewed: Additional studies/ records that were reviewed today include:   None Review of the above records demonstrates:     ASSESSMENT AND PLAN:  DILATED CARDIOMYOPATHY:   EF is now normal and much improved. He will reduce his Lasix to 20 mg daily.   ATRIAL FIB:  I am going to check his labs were amiodarone TSH and CMET.  However, I think now that he's had weight loss and treated sleep apnea we can try him off the amiodarone. He understands that he could have recurrence of his fibrillation.   Mr. Bryton Keelen has a CHA2DS2 - VASc score of 1.  We could also continue discontinuing anticoagulation in the past. His score was 2 with his previous heart failure but he seems to be lower risk now. I would consider stopping  anticoagulation if he maintains his current improved lifestyle and weight loss and improved ejection fraction.  SLEEP APNEA:  She is seeing Dr. Mayford Knife.    Current medicines are reviewed at length with the patient today.  The patient does not have concerns regarding medicines.  The following changes have been made:  As above  Labs/ tests ordered today include:    Orders Placed This Encounter  Procedures  . TSH  . Comprehensive Metabolic Panel (CMET)     Disposition:   FU with me in 6 months.    Signed, Rollene Rotunda, MD  08/20/2016 11:47 AM    [facility]

## 2016-08-20 ENCOUNTER — Ambulatory Visit (INDEPENDENT_AMBULATORY_CARE_PROVIDER_SITE_OTHER): Payer: BLUE CROSS/BLUE SHIELD | Admitting: Cardiology

## 2016-08-20 ENCOUNTER — Encounter: Payer: Self-pay | Admitting: Cardiology

## 2016-08-20 VITALS — BP 127/76 | HR 50 | Ht 69.0 in | Wt 217.4 lb

## 2016-08-20 DIAGNOSIS — I481 Persistent atrial fibrillation: Secondary | ICD-10-CM | POA: Diagnosis not present

## 2016-08-20 DIAGNOSIS — Z79899 Other long term (current) drug therapy: Secondary | ICD-10-CM | POA: Diagnosis not present

## 2016-08-20 DIAGNOSIS — Z9229 Personal history of other drug therapy: Secondary | ICD-10-CM

## 2016-08-20 DIAGNOSIS — I429 Cardiomyopathy, unspecified: Secondary | ICD-10-CM | POA: Diagnosis not present

## 2016-08-20 DIAGNOSIS — I4819 Other persistent atrial fibrillation: Secondary | ICD-10-CM

## 2016-08-20 MED ORDER — FUROSEMIDE 20 MG PO TABS: 20.0000 mg | ORAL_TABLET | Freq: Every day | ORAL | 2 refills | Status: DC

## 2016-08-20 NOTE — Patient Instructions (Signed)
Medication Instructions:  STOP- Amiodarone DECREASE- Lasix 20 mg daily  Labwork: TSH and CMP  Testing/Procedures: None Ordered  Follow-Up: Your physician wants you to follow-up in: 6 Months. You will receive a reminder letter in the mail two months in advance. If you don't receive a letter, please call our office to schedule the follow-up appointment.   Any Other Special Instructions Will Be Listed Below (If Applicable).   If you need a refill on your cardiac medications before your next appointment, please call your pharmacy.

## 2016-08-21 ENCOUNTER — Other Ambulatory Visit: Payer: BLUE CROSS/BLUE SHIELD | Admitting: *Deleted

## 2016-08-21 DIAGNOSIS — I4891 Unspecified atrial fibrillation: Secondary | ICD-10-CM | POA: Diagnosis not present

## 2016-08-21 DIAGNOSIS — I5021 Acute systolic (congestive) heart failure: Secondary | ICD-10-CM | POA: Diagnosis not present

## 2016-08-22 ENCOUNTER — Other Ambulatory Visit: Payer: Self-pay | Admitting: *Deleted

## 2016-08-22 DIAGNOSIS — G4733 Obstructive sleep apnea (adult) (pediatric): Secondary | ICD-10-CM

## 2016-08-22 LAB — COMPREHENSIVE METABOLIC PANEL
A/G RATIO: 1.8 (ref 1.2–2.2)
ALK PHOS: 44 IU/L (ref 39–117)
ALT: 5 IU/L (ref 0–44)
AST: 7 IU/L (ref 0–40)
Albumin: 3.9 g/dL (ref 3.5–5.5)
BUN/Creatinine Ratio: 18 (ref 9–20)
BUN: 21 mg/dL (ref 6–24)
Bilirubin Total: <0.2 mg/dL (ref 0.0–1.2)
CO2: 21 mmol/L (ref 18–29)
Calcium: 9 mg/dL (ref 8.7–10.2)
Chloride: 102 mmol/L (ref 96–106)
Creatinine, Ser: 1.18 mg/dL (ref 0.76–1.27)
GFR calc Af Amer: 78 mL/min/{1.73_m2} (ref >59)
GFR calc non Af Amer: 68 mL/min/{1.73_m2} (ref >59)
GLOBULIN, TOTAL: 2.2 g/dL (ref 1.5–4.5)
Glucose: 94 mg/dL (ref 65–99)
POTASSIUM: 4.3 mmol/L (ref 3.5–5.2)
SODIUM: 141 mmol/L (ref 134–144)
Total Protein: 6.1 g/dL (ref 6.0–8.5)

## 2016-08-22 LAB — TSH: TSH: 1.7 u[IU]/mL (ref 0.450–4.500)

## 2016-09-13 ENCOUNTER — Telehealth: Payer: Self-pay | Admitting: *Deleted

## 2016-09-13 DIAGNOSIS — G4733 Obstructive sleep apnea (adult) (pediatric): Secondary | ICD-10-CM

## 2016-09-13 NOTE — Telephone Encounter (Signed)
-----   Message from Traci R Turner, MD sent at 09/13/2016  9:21 AM EST ----- Change CPAP to 11cm H2O and repeat download in 2 weeks 

## 2016-09-13 NOTE — Telephone Encounter (Signed)
Order sent to [facility] °

## 2016-09-13 NOTE — Telephone Encounter (Signed)
-----   Message from Quintella Reichert, MD sent at 09/13/2016  9:21 AM EST ----- Change CPAP to 11cm H2O and repeat download in 2 weeks

## 2016-09-24 ENCOUNTER — Other Ambulatory Visit: Payer: Self-pay

## 2016-09-24 ENCOUNTER — Other Ambulatory Visit: Payer: Self-pay | Admitting: Pharmacist Clinician (PhC)/ Clinical Pharmacy Specialist

## 2016-09-24 DIAGNOSIS — I428 Other cardiomyopathies: Secondary | ICD-10-CM

## 2016-09-24 DIAGNOSIS — I4891 Unspecified atrial fibrillation: Secondary | ICD-10-CM

## 2016-09-24 MED ORDER — FUROSEMIDE 20 MG PO TABS: 20.0000 mg | ORAL_TABLET | Freq: Every day | ORAL | 11 refills | Status: DC

## 2016-09-24 MED ORDER — SPIRONOLACTONE 25 MG PO TABS: 25.0000 mg | ORAL_TABLET | Freq: Every day | ORAL | 11 refills | Status: DC

## 2016-09-24 MED ORDER — HYDRALAZINE HCL 50 MG PO TABS: 50.0000 mg | ORAL_TABLET | Freq: Two times a day (BID) | ORAL | 11 refills | Status: DC

## 2016-09-24 MED ORDER — RIVAROXABAN 20 MG PO TABS: 20.0000 mg | ORAL_TABLET | Freq: Every day | ORAL | 6 refills | Status: DC

## 2016-09-24 MED ORDER — CARVEDILOL 12.5 MG PO TABS: 12.5000 mg | ORAL_TABLET | Freq: Two times a day (BID) | ORAL | 11 refills | Status: DC

## 2016-09-24 NOTE — Telephone Encounter (Signed)
Rx(s) sent to pharmacy electronically.  

## 2016-10-04 ENCOUNTER — Encounter: Payer: Self-pay | Admitting: Cardiology

## 2016-10-11 ENCOUNTER — Telehealth: Payer: Self-pay | Admitting: *Deleted

## 2016-10-11 NOTE — Telephone Encounter (Signed)
Called the patient and gave him his results, he verbalized understanding but complained that he wanted his pressure lowered. He complains that his mouth is raw in the morning and his cheeks are stuck to his teeth when he wakes up and his tongue is cracked feels almost wind chapped. He liked the pressure during the titration a lot because none of this happened then

## 2016-10-11 NOTE — Telephone Encounter (Signed)
-----   Message from Traci R Turner, MD sent at 10/09/2016  8:07 PM EDT ----- Good AHI and compliance.  Continue current CPAP settings. 

## 2016-10-25 ENCOUNTER — Other Ambulatory Visit: Payer: Self-pay | Admitting: Cardiology

## 2016-10-25 DIAGNOSIS — I4891 Unspecified atrial fibrillation: Secondary | ICD-10-CM

## 2016-10-25 DIAGNOSIS — I428 Other cardiomyopathies: Secondary | ICD-10-CM

## 2016-10-26 NOTE — Telephone Encounter (Signed)
Rx request sent to pharmacy.  

## 2017-02-28 IMAGING — CR DG CHEST 1V PORT
1 series · 1 of 1 positions shown · non-contrast
Comparison: None.

CLINICAL DATA: Shortness of breath, atrial fibrillation

EXAM:
PORTABLE CHEST 1 VIEW

[AP]
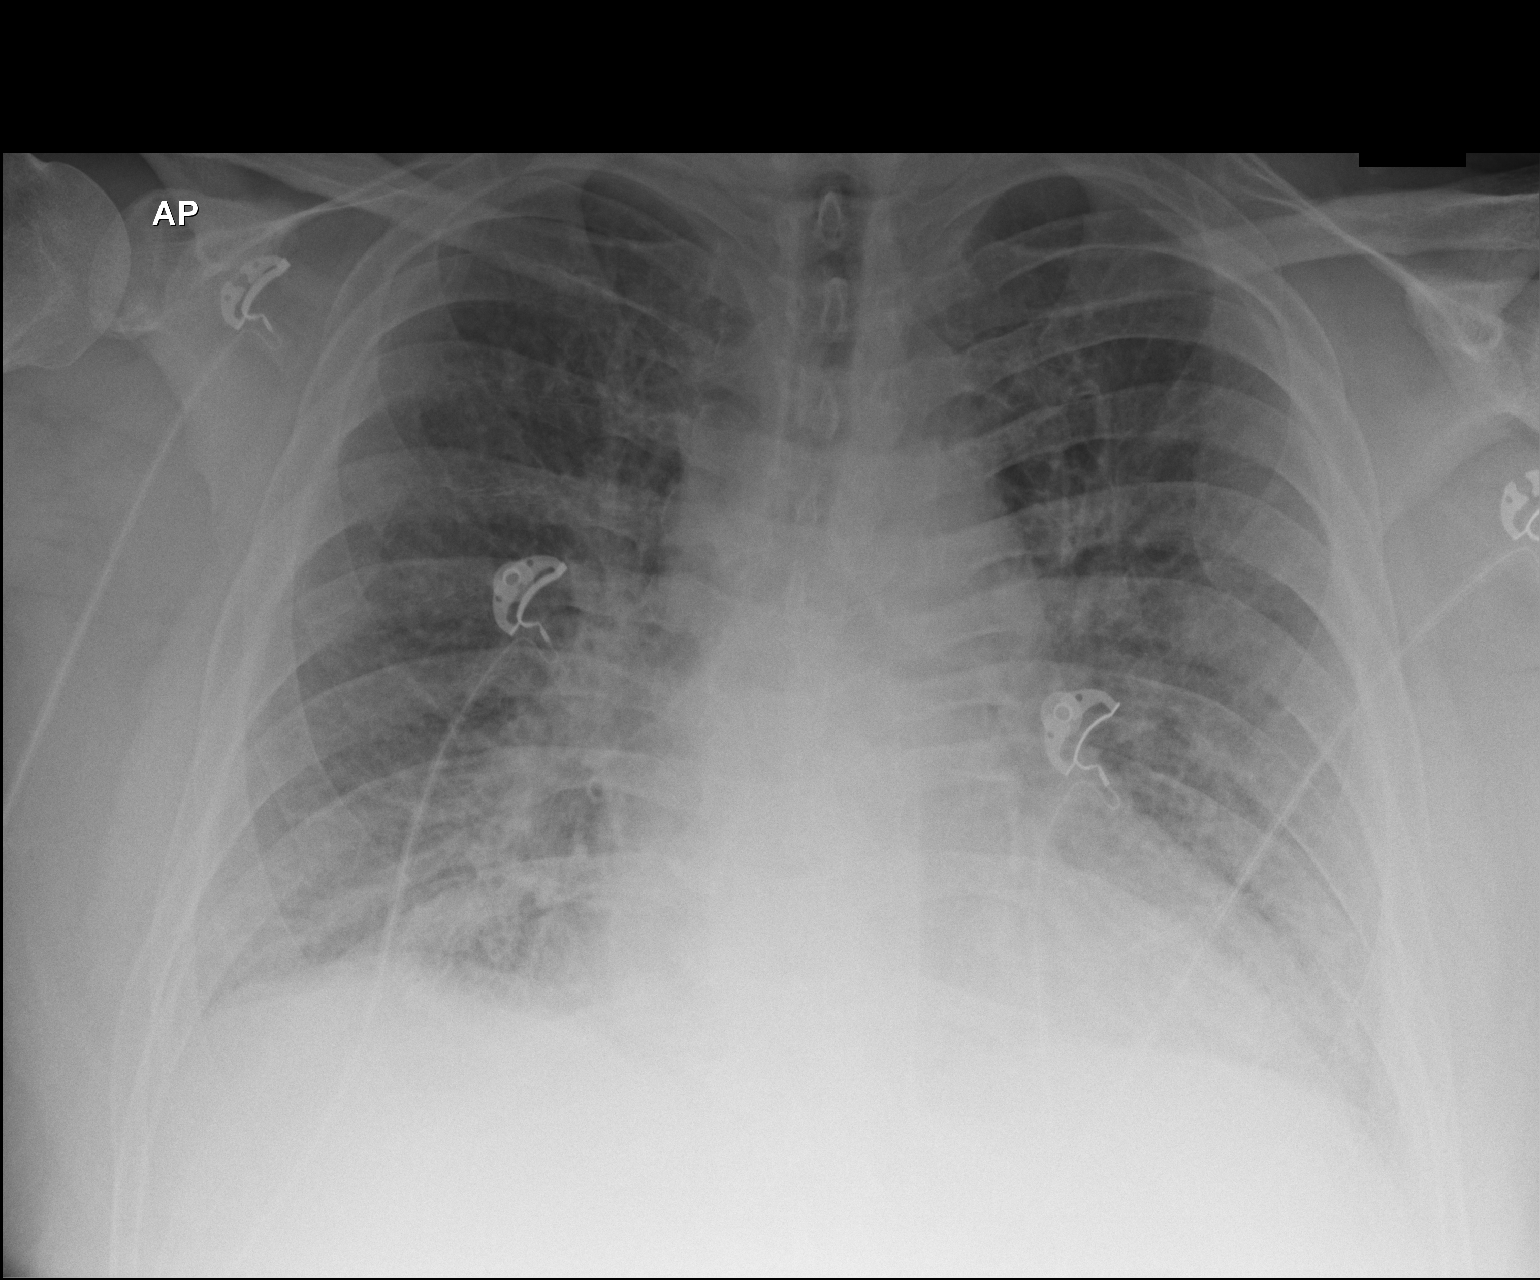

[1 of 1 positions shown; findings below may reference images not displayed]

FINDINGS: Diffuse bilateral interstitial thickening. Trace bilateral pleural
effusions. No pneumothorax. Normal cardiomediastinal silhouette. No
acute osseous abnormality.
IMPRESSION: Findings most consistent with mild pulmonary edema.

## 2017-03-12 DIAGNOSIS — G4733 Obstructive sleep apnea (adult) (pediatric): Secondary | ICD-10-CM | POA: Diagnosis not present

## 2017-03-12 IMAGING — DX DG CHEST 2V
2 series · 2 of 2 positions shown · non-contrast
Comparison: 08/04/2015

CLINICAL DATA: SOB x last night, light headed x this am, hx of
cardio version/AFIB, HTN, diabetic, past smoker

EXAM:
CHEST  2 VIEW

[chest pa]
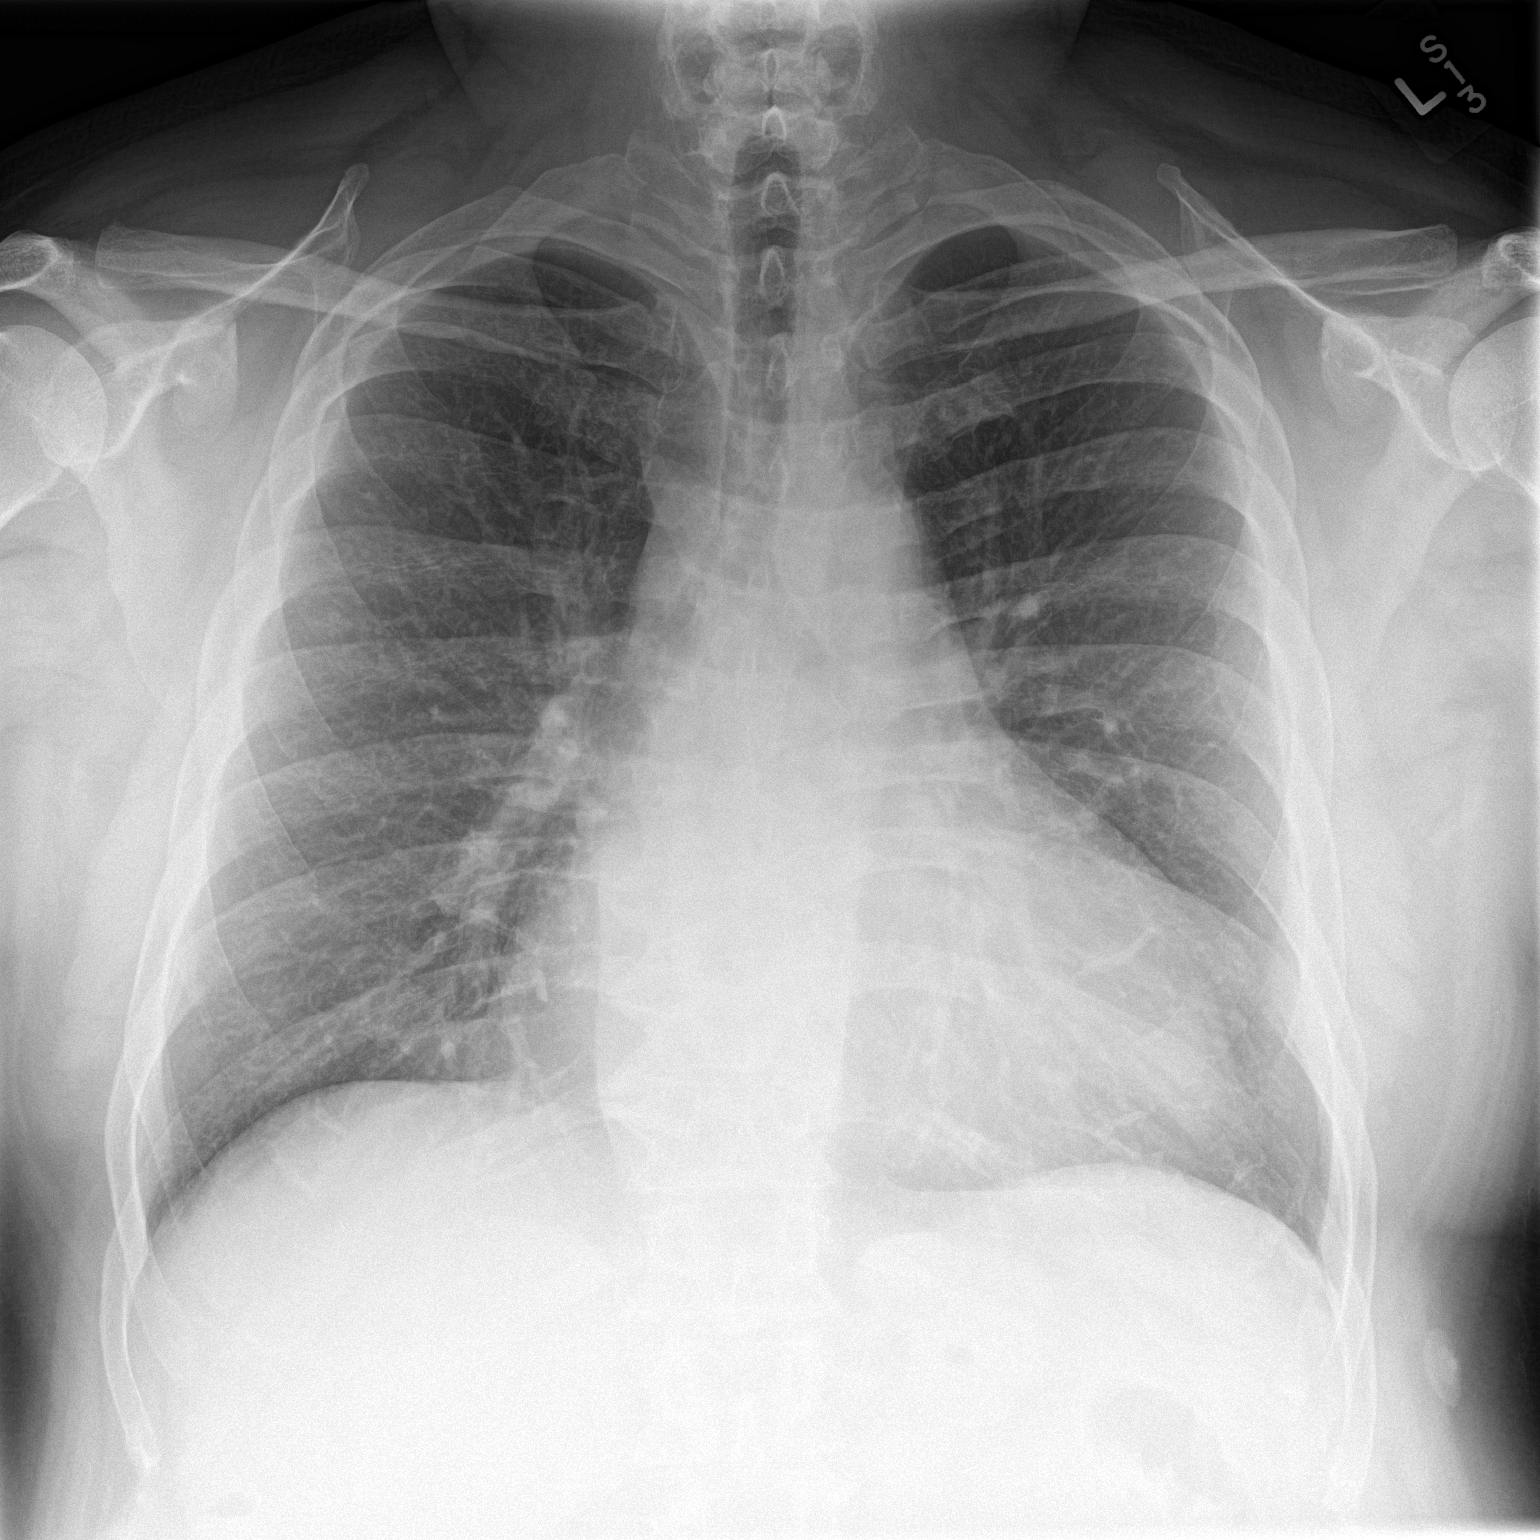

[chest lat]
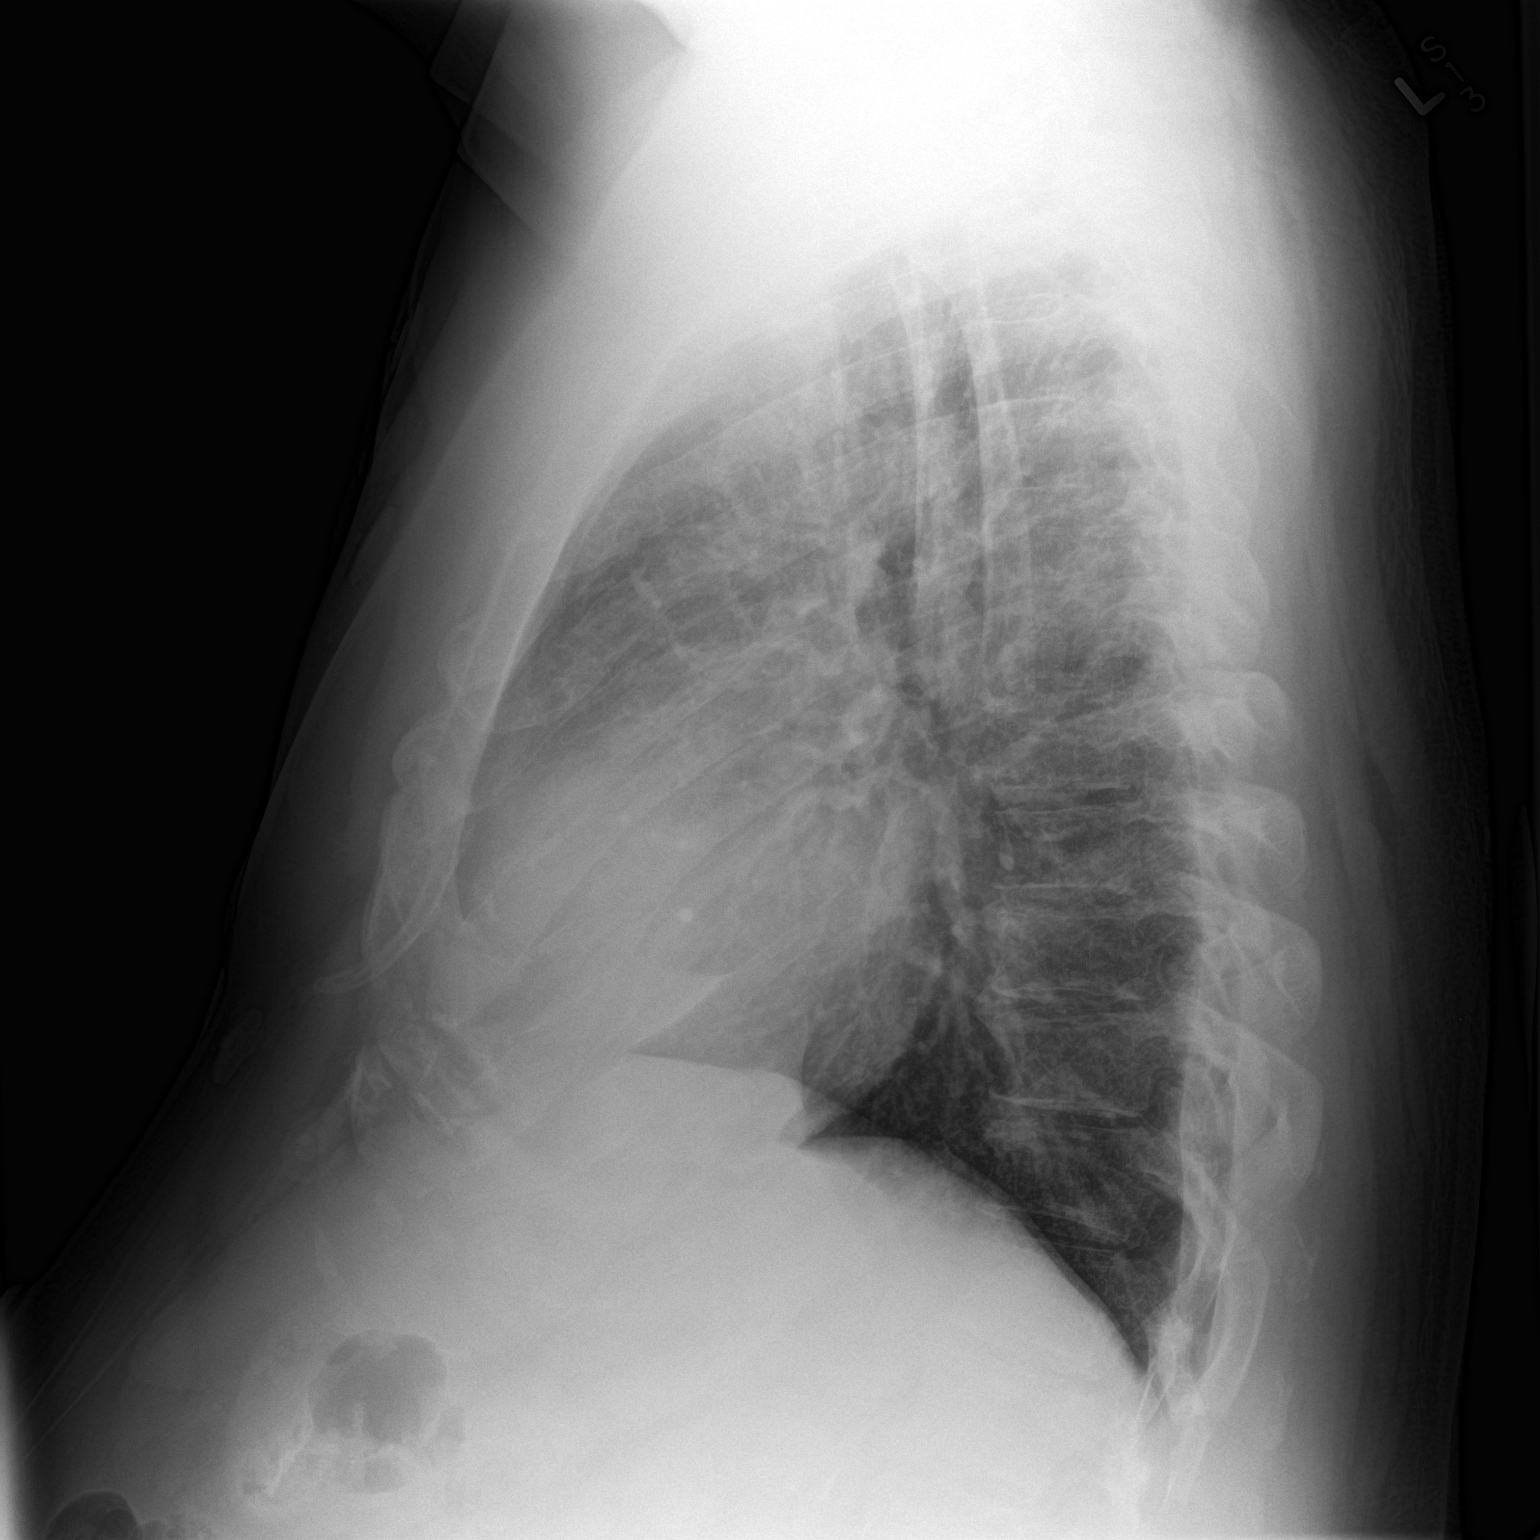

[2 of 2 positions shown; findings below may reference images not displayed]

FINDINGS: No cardiac silhouette is mildly enlarged. No mediastinal or hilar
masses or evidence of adenopathy.

Lungs show mildly thickened interstitial markings, significantly
improved when compared to the prior exam. There is no evidence of
pneumonia. No pleural effusion or pneumothorax.

Bony thorax is intact.
IMPRESSION: 1. There has been significant improvement from the prior chest
radiograph. Pulmonary edema seen previously is essentially resolved.
There is some mild interstitial prominence on the current study
which may all be chronic. Minimal interstitial edema is possible.
2. Mild cardiomegaly.
3. No evidence of pneumonia.

## 2017-07-03 ENCOUNTER — Other Ambulatory Visit: Payer: Self-pay | Admitting: Cardiology

## 2017-07-03 DIAGNOSIS — I428 Other cardiomyopathies: Secondary | ICD-10-CM

## 2017-07-03 DIAGNOSIS — I4891 Unspecified atrial fibrillation: Secondary | ICD-10-CM

## 2017-07-03 NOTE — Telephone Encounter (Signed)
Rx(s) sent to pharmacy electronically.  

## 2017-10-23 ENCOUNTER — Other Ambulatory Visit: Payer: Self-pay | Admitting: Cardiology

## 2017-10-23 DIAGNOSIS — I428 Other cardiomyopathies: Secondary | ICD-10-CM

## 2017-10-23 DIAGNOSIS — I4891 Unspecified atrial fibrillation: Secondary | ICD-10-CM

## 2017-10-25 NOTE — Telephone Encounter (Signed)
Called patient to schedule yearly visit. Left message for patient to give the office a call back.

## 2017-10-30 ENCOUNTER — Telehealth: Payer: Self-pay | Admitting: Cardiology

## 2017-10-30 MED ORDER — SPIRONOLACTONE 25 MG PO TABS: 25.0000 mg | ORAL_TABLET | Freq: Every day | ORAL | 0 refills | Status: DC

## 2017-10-30 NOTE — Telephone Encounter (Signed)
New Medication:     *STAT* If patient is at the pharmacy, call can be transferred to refill team.   1. Which medications need to be refilled? (please list name of each medication and dose if known) spironolactone (ALDACTONE) 25 MG tablet, carvedilol (COREG) 12.5 MG tablet  2. Which pharmacy/location (including street and city if local pharmacy) is medication to be sent to?[id number] - [place], [place] - [address] AT [facility] [address]  3. Do they need a 30 day or 90 day supply? 30   Pt has appt on 5/16. Pt states he stopped taken Xarelto and Hydralazine due to having a route canal done and has not started back taking them

## 2017-12-11 ENCOUNTER — Encounter: Payer: Self-pay | Admitting: Cardiology

## 2017-12-11 NOTE — Progress Notes (Signed)
Cardiology Office Note   Date:  12/12/2017   ID:  James [name], DOB [birth year], MRN [id number]  PCP:  Patient, No Pcp Per  Cardiologist:   Rollene Rotunda, MD   Chief Complaint  Patient presents with  . Cardiomyopathy      History of Present Illness: James [name] is a 62 y.o. male who presents for evaluation of atrial fibrillation. He was in the hospital in January 2017 with shortness of breath. He presented and was in atrial fibrillation. He subsequently needed TEE guided cardioversion. However, he did not convert to sinus rhythm with this and was treated with amiodarone. His second attempt at TEE cardioversion following this was successful. He was treated with IV diuresis. Of note he does have some renal insufficiency. He has a dilated cardiomyopathy with an EF of 20-25%. Stress testing demonstrated an inferior wall defect which may have been artifact or prior inferior infarct with scarring. However, there was no ischemia.  The EF on follow up in July 2017 was back to normal. .  He did see Dr. Mayford Knife for treatment of sleep apnea.    Unfortunately he has not been seen since 2017.  He ran out of his medicines several weeks ago.  He was only taking his carvedilol once a day it turns out.  He was taken his hydralazine once a day.  He has not had spironolactone or carvedilol.  He has been taking his Xarelto.  He is in atrial fibrillation today.  He says this happens periodically.  I did take him off of amiodarone and aspirin his ejection fraction improved as I did not want to continue this medication indefinitely.  He says he goes back into sinus rhythm but this is judged by his blood pressure cuff.  He feels somewhat better than he used to.  He is not fatigued like he used to.  He does not have any shortness of breath, PND or orthopnea.  He has had no chest pressure, neck or arm discomfort.  He walks per his report 12 miles at work every  day.  He has had some weight gain but this is from when he  was working the third shift.  Past Medical History:  Diagnosis Date  . Asthma   . Former tobacco use   . Hypertension   . Morbid obesity (HCC)   . New onset atrial fibrillation (HCC) 07/2015  . Non-ischemic cardiomyopathy (HCC)   . Suspected sleep apnea     Past Surgical History:  Procedure Laterality Date  . CARDIOVERSION N/A 08/08/2015   Procedure: CARDIOVERSION;  Surgeon: Lars Masson, MD;  Location: [facility] ENDOSCOPY;  Service: Cardiovascular;  Laterality: N/A;  . CARDIOVERSION N/A 08/12/2015   Procedure: CARDIOVERSION;  Surgeon: Chilton Si, MD;  Location: [facility] ENDOSCOPY;  Service: Cardiovascular;  Laterality: N/A;  . MOUTH SURGERY    . TEE WITHOUT CARDIOVERSION N/A 08/08/2015   Procedure: TRANSESOPHAGEAL ECHOCARDIOGRAM (TEE);  Surgeon: Lars Masson, MD;  Location: [facility] ENDOSCOPY;  Service: Cardiovascular;  Laterality: N/A;     Current Outpatient Medications  Medication Sig Dispense Refill  . atorvastatin (LIPITOR) 40 MG tablet Take 1 tablet (40 mg total) by mouth daily at 6 PM. 30 tablet 11  . carvedilol (COREG) 12.5 MG tablet Take 1 tablet (12.5 mg total) by mouth 2 (two) times daily with a meal. 180 tablet 3  . furosemide (LASIX) 20 MG tablet Take 1 tablet (20 mg total) by mouth daily. 30 tablet 11  . furosemide (LASIX) 20  MG tablet Take 1 tablet (20 mg total) by mouth daily. PLEASE CONTACT OFFICE FOR ADDITIONAL REFILLS 15 tablet 0  . hydrALAZINE (APRESOLINE) 50 MG tablet Take 1 tablet (50 mg total) by mouth 2 (two) times daily. 180 tablet 3  . MAGNESIUM PO Take 1 tablet by mouth daily.    . potassium chloride (K-DUR) 10 MEQ tablet Take 1 tablet (10 mEq total) by mouth daily. <PLEASE MAKE APPOINTMENT FOR REFILLS> 90 tablet 0  . rivaroxaban (XARELTO) 20 MG TABS tablet Take 1 tablet (20 mg total) by mouth daily with supper. CONTACT OFFICE FOR APPOINTMENT PRIOR TO NEXT REFILL AUTHORIZATION 15 tablet 0  . spironolactone (ALDACTONE) 25 MG tablet Take 1 tablet (25 mg total) by  mouth daily. 90 tablet 3   No current facility-administered medications for this visit.     Allergies:   Patient has no known allergies.     ROS:  Please see the history of present illness.   Otherwise, review of systems are positive for none.   All other systems are reviewed and negative.    PHYSICAL EXAM: VS:  BP (!) 148/76 (BP Location: Left Arm, Patient Position: Sitting, Cuff Size: Large)   Pulse (!) 107   Ht 5\' 10"  (1.778 m)   Wt 235 lb (106.6 kg)   BMI 33.72 kg/m  , BMI Body mass index is 33.72 kg/m.  GENERAL:  Well appearing NECK:  No jugular venous distention, waveform within normal limits, carotid upstroke brisk and symmetric, no bruits, no thyromegaly LUNGS:  Clear to auscultation bilaterally CHEST:  Unremarkable HEART:  PMI not displaced or sustained,S1 and S2 within normal limits, no S3,  no clicks, no rubs, no murmurs, irregular ABD:  Flat, positive bowel sounds normal in frequency in pitch, no bruits, no rebound, no guarding, no midline pulsatile mass, no hepatomegaly, no splenomegaly EXT:  2 plus pulses throughout, no edema, no cyanosis no clubbing   EKG:  EKG is ordered today. Atrial fibrillation, rate 107, low voltage on the limb leads, no acute ST-T wave changes.  Recent Labs: No results found for requested labs within last 8760 hours.    Lipid Panel    Component Value Date/Time   CHOL 108 (L) 09/30/2015 0736   TRIG 93 09/30/2015 0736   HDL 47 09/30/2015 0736   CHOLHDL 2.3 09/30/2015 0736   VLDL 19 09/30/2015 0736   LDLCALC 42 09/30/2015 0736     Lab Results  Component Value Date   TSH 1.700 08/21/2016   ALT 5 08/21/2016   AST 7 08/21/2016   ALKPHOS 44 08/21/2016   BILITOT <0.2 08/21/2016   PROT 6.1 08/21/2016   ALBUMIN 3.9 08/21/2016     Wt Readings from Last 3 Encounters:  12/12/17 235 lb (106.6 kg)  08/20/16 217 lb 6 oz (98.6 kg)  07/10/16 227 lb 12.8 oz (103.3 kg)      Other studies Reviewed: Additional studies/ records that  were reviewed today include:   None  Review of the above records demonstrates:     ASSESSMENT AND PLAN:  DILATED CARDIOMYOPATHY:     I need to get him back on his medications and at the correct intervals.  Him to restart the carvedilol.  Restart the spironolactone.  The hydralazine is been to be twice a day.     ATRIAL FIB:     He is back in this.  I will check a 48 hour Holter.   If this seems to be persistent or permanent I will need  to consider cardioversion and treatment with another medication.   He has been taking his Xarelto.  I will check a CBC today.  HTN:  This is being managed in the context of treating his CHF  SLEEP APNEA: He is compliant with his CPAP.  Current medicines are reviewed at length with the patient today.  The patient does not have concerns regarding medicines.  The following changes have been made:   As above.   Labs/ tests ordered today include:  None  Orders Placed This Encounter  Procedures  . HOLTER MONITOR - 48 HOUR     Disposition:   FU with me in 1 month.    Signed, Rollene Rotunda, MD  12/12/2017 10:58 AM    [facility]

## 2017-12-12 ENCOUNTER — Encounter: Payer: Self-pay | Admitting: Cardiology

## 2017-12-12 ENCOUNTER — Ambulatory Visit: Payer: BLUE CROSS/BLUE SHIELD | Admitting: Cardiology

## 2017-12-12 VITALS — BP 148/76 | HR 107 | Ht 70.0 in | Wt 235.0 lb

## 2017-12-12 DIAGNOSIS — I10 Essential (primary) hypertension: Secondary | ICD-10-CM | POA: Diagnosis not present

## 2017-12-12 DIAGNOSIS — I4891 Unspecified atrial fibrillation: Secondary | ICD-10-CM

## 2017-12-12 DIAGNOSIS — I428 Other cardiomyopathies: Secondary | ICD-10-CM | POA: Diagnosis not present

## 2017-12-12 LAB — CBC
HEMOGLOBIN: 17.1 g/dL (ref 13.0–17.7)
Hematocrit: 49.6 % (ref 37.5–51.0)
MCH: 30.8 pg (ref 26.6–33.0)
MCHC: 34.5 g/dL (ref 31.5–35.7)
MCV: 89 fL (ref 79–97)
Platelets: 210 10*3/uL (ref 150–379)
RBC: 5.55 x10E6/uL (ref 4.14–5.80)
RDW: 14.6 % (ref 12.3–15.4)
WBC: 7.9 10*3/uL (ref 3.4–10.8)

## 2017-12-12 MED ORDER — CARVEDILOL 12.5 MG PO TABS: 12.5000 mg | ORAL_TABLET | Freq: Two times a day (BID) | ORAL | 3 refills | Status: DC

## 2017-12-12 MED ORDER — HYDRALAZINE HCL 50 MG PO TABS: 50.0000 mg | ORAL_TABLET | Freq: Two times a day (BID) | ORAL | 3 refills | Status: DC

## 2017-12-12 MED ORDER — SPIRONOLACTONE 25 MG PO TABS: 25.0000 mg | ORAL_TABLET | Freq: Every day | ORAL | 3 refills | Status: DC

## 2017-12-12 NOTE — Patient Instructions (Signed)
Medication Instructions:  Continue current medication  If you need a refill on your cardiac medications before your next appointment, please call your pharmacy.  Labwork: None Ordered   Testing/Procedures: Your physician has recommended that you wear a holter monitor for 48 hour. Holter monitors are medical devices that record the heart's electrical activity. Doctors most often use these monitors to diagnose arrhythmias. Arrhythmias are problems with the speed or rhythm of the heartbeat. The monitor is a small, portable device. You can wear one while you do your normal daily activities. This is usually used to diagnose what is causing palpitations/syncope (passing out).  Follow-Up: Your physician wants you to follow-up in: 1 Month.      Thank you for choosing [facility] at [facility]!!

## 2017-12-12 NOTE — Addendum Note (Signed)
Addended by: Barrie Dunker on: 12/12/2017 11:08 AM   Modules accepted: Orders

## 2017-12-17 ENCOUNTER — Telehealth: Payer: Self-pay | Admitting: *Deleted

## 2017-12-17 DIAGNOSIS — Z79899 Other long term (current) drug therapy: Secondary | ICD-10-CM

## 2017-12-17 NOTE — Telephone Encounter (Signed)
Pt made aware of his blood work, BMP ordered, pt stated he will get blood work done next Tuesday at [address] office when to get his monitor place.

## 2017-12-17 NOTE — Telephone Encounter (Signed)
-----   Message from Rollene Rotunda, MD sent at 12/15/2017  3:11 PM EDT ----- CBC is OK.  I would like to check BMET in 7 days since he has restarted spironolactone.   Call Mr. Lapenta with the results

## 2017-12-24 ENCOUNTER — Other Ambulatory Visit: Payer: BLUE CROSS/BLUE SHIELD | Admitting: *Deleted

## 2017-12-24 ENCOUNTER — Ambulatory Visit (INDEPENDENT_AMBULATORY_CARE_PROVIDER_SITE_OTHER): Payer: BLUE CROSS/BLUE SHIELD

## 2017-12-24 DIAGNOSIS — Z79899 Other long term (current) drug therapy: Secondary | ICD-10-CM | POA: Diagnosis not present

## 2017-12-24 DIAGNOSIS — I4891 Unspecified atrial fibrillation: Secondary | ICD-10-CM | POA: Diagnosis not present

## 2017-12-24 DIAGNOSIS — I10 Essential (primary) hypertension: Secondary | ICD-10-CM

## 2017-12-24 LAB — BASIC METABOLIC PANEL
BUN / CREAT RATIO: 9 (ref 9–20)
BUN: 11 mg/dL (ref 6–24)
CALCIUM: 9.2 mg/dL (ref 8.7–10.2)
CHLORIDE: 105 mmol/L (ref 96–106)
CO2: 22 mmol/L (ref 20–29)
CREATININE: 1.21 mg/dL (ref 0.76–1.27)
GFR calc Af Amer: 75 mL/min/{1.73_m2} (ref >59)
GFR calc non Af Amer: 65 mL/min/{1.73_m2} (ref >59)
GLUCOSE: 110 mg/dL — AB (ref 65–99)
Potassium: 4.4 mmol/L (ref 3.5–5.2)
Sodium: 141 mmol/L (ref 134–144)

## 2017-12-24 NOTE — Addendum Note (Signed)
Addended by: Neta Ehlers on: 12/24/2017 03:52 PM   Modules accepted: Orders

## 2018-01-10 ENCOUNTER — Other Ambulatory Visit: Payer: Self-pay | Admitting: Cardiology

## 2018-01-19 NOTE — Progress Notes (Signed)
Cardiology Office Note   Date:  01/20/2018   ID:  James [name], DOB Dec 27, 1955, MRN [id number]  PCP:  Patient, No Pcp Per  Cardiologist:   James Rotunda, MD   Chief Complaint  Patient presents with  . Atrial Fibrillation      History of Present Illness: James [name] is a 62 y.o. male who presents for evaluation of atrial fibrillation. He was in the hospital in January 2017 with shortness of breath. He presented and was in atrial fibrillation. He subsequently needed TEE guided cardioversion. However, he did not convert to sinus rhythm with this and was treated with amiodarone. His second attempt at TEE cardioversion following this was successful. He was treated with IV diuresis. Of note he does have some renal insufficiency. He has a dilated cardiomyopathy with an EF of 20-25%. Stress testing demonstrated an inferior wall defect which may have been artifact or prior inferior infarct with scarring. However, there was no ischemia.  The EF on follow up in July 2017 was back to normal. .  He did see Dr. Mayford [name] for treatment of sleep apnea.   At the last visit last month he was back in atrial fib.  He had a Holter.   This actually demonstrated PAF.    He does feel his heart goes out of rhythm he says it does not provide an hour at a time when it happens.  It does not particularly bother him. The patient denies any new symptoms such as chest discomfort, neck or arm discomfort. There has been no new shortness of breath, PND or orthopnea. There have been no reported palpitations, presyncope or syncope.  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  He denies any symptoms with this.   Past Medical History:  Diagnosis Date  . Asthma   . Former tobacco use   . Hypertension   . Morbid obesity (HCC)   . New onset atrial fibrillation (HCC) 07/2015  . Non-ischemic cardiomyopathy (HCC)   . Suspected sleep apnea     Past Surgical  History:  Procedure Laterality Date  . CARDIOVERSION N/A 08/08/2015   Procedure: CARDIOVERSION;  Surgeon: James Masson, MD;  Location: [facility] ENDOSCOPY;  Service: Cardiovascular;  Laterality: N/A;  . CARDIOVERSION N/A 08/12/2015   Procedure: CARDIOVERSION;  Surgeon: James Si, MD;  Location: [facility] ENDOSCOPY;  Service: Cardiovascular;  Laterality: N/A;  . MOUTH SURGERY    . TEE WITHOUT CARDIOVERSION N/A 08/08/2015   Procedure: TRANSESOPHAGEAL ECHOCARDIOGRAM (TEE);  Surgeon: James Masson, MD;  Location: [facility] ENDOSCOPY;  Service: Cardiovascular;  Laterality: N/A;     Current Outpatient Medications  Medication Sig Dispense Refill  . atorvastatin (LIPITOR) 40 MG tablet Take 1 tablet (40 mg total) by mouth daily at 6 PM. 30 tablet 11  . carvedilol (COREG) 12.5 MG tablet Take 1 tablet (12.5 mg total) by mouth 2 (two) times daily with a meal. 180 tablet 3  . hydrALAZINE (APRESOLINE) 50 MG tablet Take 1 tablet (50 mg total) by mouth 2 (two) times daily. 180 tablet 3  . MAGNESIUM PO Take 1 tablet by mouth daily.    . potassium chloride (K-DUR) 10 MEQ tablet Take 1 tablet (10 mEq total) by mouth daily. <PLEASE MAKE APPOINTMENT FOR REFILLS> 90 tablet 0  . spironolactone (ALDACTONE) 25 MG tablet Take 1 tablet (25 mg total) by mouth daily. 90 tablet 3   No current facility-administered medications for this visit.  Allergies:   Patient has no known allergies.     ROS:  Please see the history of present illness.   Otherwise, review of systems are positive for none.   All other systems are reviewed and negative.    PHYSICAL EXAM: VS:  BP 113/80   Pulse 83   Ht 5\' 10"  (1.778 m)   Wt 240 lb (108.9 kg)   BMI 34.44 kg/m  , BMI Body mass index is 34.44 kg/m.  GENERAL:  Well appearing NECK:  No jugular venous distention, waveform within normal limits, carotid upstroke brisk and symmetric, no bruits, no thyromegaly LUNGS:  Clear to auscultation bilaterally CHEST:  Unremarkable HEART:  PMI  not displaced or sustained,S1 and S2 within normal limits, no S3,  no clicks, no rubs, no murmurs, irregular ABD:  Flat, positive bowel sounds normal in frequency in pitch, no bruits, no rebound, no guarding, no midline pulsatile mass, no hepatomegaly, no splenomegaly EXT:  2 plus pulses throughout, no edema, no cyanosis no clubbing   EKG:  EKG is ordered today. Atrial fibrillation, rate 83 , low voltage on the limb leads, no acute ST-T wave changes.  Recent Labs: 12/12/2017: Hemoglobin 17.1; Platelets 210 12/24/2017: BUN 11; Creatinine, Ser 1.21; Potassium 4.4; Sodium 141    Lipid Panel    Component Value Date/Time   CHOL 108 (L) 09/30/2015 0736   TRIG 93 09/30/2015 0736   HDL 47 09/30/2015 0736   CHOLHDL 2.3 09/30/2015 0736   VLDL 19 09/30/2015 0736   LDLCALC 42 09/30/2015 0736     Lab Results  Component Value Date   TSH 1.700 08/21/2016   ALT 5 08/21/2016   AST 7 08/21/2016   ALKPHOS 44 08/21/2016   BILITOT <0.2 08/21/2016   PROT 6.1 08/21/2016   ALBUMIN 3.9 08/21/2016     Wt Readings from Last 3 Encounters:  01/20/18 240 lb (108.9 kg)  12/12/17 235 lb (106.6 kg)  08/20/16 217 lb 6 oz (98.6 kg)      Other studies Reviewed: Additional studies/ records that were reviewed today include:   Holter Review of the above records demonstrates:   See elsewhere  ASSESSMENT AND PLAN:    DILATED CARDIOMYOPATHY:    I will repeat an echocardiogram.  For now he will continue the meds as listed.  At the last visit he had been out of meds and he restarted these.    ATRIAL FIB:    His atrial fib was paroxysmal at the last visit.  He really is not bothered by this clinically. [name] has a CHA2DS2 - VASc score of 1.  He will take ASA.  At this point I do not see a benefit to antiarrhythmic therapy as his paroxysms are short-lived and not particularly symptomatic.  He will let me know if this changes in the future.  He is not at high thromboembolic risk and so will be on  aspirin.  HTN:   The blood pressure is at target. No change in medications is indicated. We will continue with therapeutic lifestyle changes (TLC).  SLEEP APNEA:   He is compliant with his mask.    Current medicines are reviewed at length with the patient today.  The patient does not have concerns regarding medicines.  The following changes have been made:   None  Labs/ tests ordered today include:  None  Orders Placed This Encounter  Procedures  . EKG 12-Lead  . ECHOCARDIOGRAM COMPLETE     Disposition:   FU with  me in 6 months.    Signed, James Rotunda, MD  01/20/2018 10:52 AM    [facility]

## 2018-01-20 ENCOUNTER — Encounter: Payer: Self-pay | Admitting: Cardiology

## 2018-01-20 ENCOUNTER — Ambulatory Visit: Payer: BLUE CROSS/BLUE SHIELD | Admitting: Cardiology

## 2018-01-20 VITALS — BP 113/80 | HR 83 | Ht 70.0 in | Wt 240.0 lb

## 2018-01-20 DIAGNOSIS — I480 Paroxysmal atrial fibrillation: Secondary | ICD-10-CM | POA: Diagnosis not present

## 2018-01-20 DIAGNOSIS — I10 Essential (primary) hypertension: Secondary | ICD-10-CM | POA: Diagnosis not present

## 2018-01-20 DIAGNOSIS — I428 Other cardiomyopathies: Secondary | ICD-10-CM

## 2018-01-20 NOTE — Patient Instructions (Signed)
Medication Instructions:  Continue current medications  If you need a refill on your cardiac medications before your next appointment, please call your pharmacy.  Labwork: None Ordered   Testing/Procedures: Your physician has requested that you have an echocardiogram. Echocardiography is a painless test that uses sound waves to create images of your heart. It provides your doctor with information about the size and shape of your heart and how well your heart's chambers and valves are working. This procedure takes approximately one hour. There are no restrictions for this procedure.  Follow-Up: Your physician wants you to follow-up in: 6 Months. You should receive a reminder letter in the mail two months in advance. If you do not receive a letter, please call our office [phone].     Thank you for choosing [facility]!!      

## 2018-02-03 ENCOUNTER — Telehealth: Payer: Self-pay | Admitting: Cardiology

## 2018-02-03 NOTE — Telephone Encounter (Signed)
Patient made aware of results and verbalized understanding.  Notes recorded by Rollene Rotunda, MD on 01/23/2018 at 10:35 PM EDT PAF. He is aware of this. No change in therapy. Call Mr. Tustin with the results

## 2018-02-03 NOTE — Telephone Encounter (Signed)
Pt stated he is returning a call from [place]

## 2018-02-04 ENCOUNTER — Ambulatory Visit (HOSPITAL_COMMUNITY): Payer: BLUE CROSS/BLUE SHIELD | Attending: Cardiovascular Disease

## 2018-02-04 ENCOUNTER — Other Ambulatory Visit: Payer: Self-pay

## 2018-02-04 DIAGNOSIS — Z6834 Body mass index (BMI) 34.0-34.9, adult: Secondary | ICD-10-CM | POA: Insufficient documentation

## 2018-02-04 DIAGNOSIS — I428 Other cardiomyopathies: Secondary | ICD-10-CM

## 2018-02-04 DIAGNOSIS — I10 Essential (primary) hypertension: Secondary | ICD-10-CM | POA: Insufficient documentation

## 2018-02-04 DIAGNOSIS — Z87891 Personal history of nicotine dependence: Secondary | ICD-10-CM | POA: Diagnosis not present

## 2018-02-04 DIAGNOSIS — E669 Obesity, unspecified: Secondary | ICD-10-CM | POA: Insufficient documentation

## 2018-02-04 DIAGNOSIS — I480 Paroxysmal atrial fibrillation: Secondary | ICD-10-CM | POA: Insufficient documentation

## 2018-02-17 ENCOUNTER — Encounter: Payer: Self-pay | Admitting: *Deleted

## 2018-07-01 DIAGNOSIS — H2513 Age-related nuclear cataract, bilateral: Secondary | ICD-10-CM | POA: Diagnosis not present

## 2018-07-01 DIAGNOSIS — H31103 Choroidal degeneration, unspecified, bilateral: Secondary | ICD-10-CM | POA: Diagnosis not present

## 2018-07-01 DIAGNOSIS — H35371 Puckering of macula, right eye: Secondary | ICD-10-CM | POA: Diagnosis not present

## 2018-07-01 DIAGNOSIS — H25013 Cortical age-related cataract, bilateral: Secondary | ICD-10-CM | POA: Diagnosis not present

## 2018-07-15 NOTE — Progress Notes (Signed)
Cardiology Office Note   Date:  07/17/2018   ID:  James [name], DOB [birth year], MRN [id number]  PCP:  Patient, No Pcp Per  Cardiologist:   Rollene Rotunda, MD   Chief Complaint  Patient presents with  . Atrial Fibrillation      History of Present Illness: James [name] is a 62 y.o. male who presents for evaluation of atrial fibrillation. He was in the hospital in January 2017 with shortness of breath. He presented and was in atrial fibrillation. He subsequently needed TEE guided cardioversion. However, he did not convert to sinus rhythm with this and was treated with amiodarone. His second attempt at TEE cardioversion following this was successful. He was treated with IV diuresis. Of note he does have some renal insufficiency. He has a dilated cardiomyopathy with an EF of 20-25%. Stress testing demonstrated an inferior wall defect which may have been artifact or prior inferior infarct with scarring. However, there was no ischemia.  The EF on follow up in July 2017 was back to normal.  It was 45% on echo in July of this year.  He did see Dr. Mayford Knife for treatment of sleep apnea.   Earlier this year he was back in atrial fib.  He had a Holter.   This actually demonstrated PAF.    He returns for follow-up.  He is actually doing well since I last saw him and does not realize today that he is in fibrillation.  He is not noticing any palpitations.  He does not have any presyncope or syncope.  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  He is not having any new shortness of breath, PND or orthopnea.  Is not having any chest pressure, neck or arm discomfort.  He had no weight gain or edema.   Past Medical History:  Diagnosis Date  . Asthma   . Former tobacco use   . Hypertension   . Morbid obesity (HCC)   . New onset atrial fibrillation (HCC) 07/2015  . Non-ischemic cardiomyopathy (HCC)   . Suspected sleep apnea     Past Surgical History:  Procedure Laterality Date  .  CARDIOVERSION N/A 08/08/2015   Procedure: CARDIOVERSION;  Surgeon: Lars Masson, MD;  Location: [facility] ENDOSCOPY;  Service: Cardiovascular;  Laterality: N/A;  . CARDIOVERSION N/A 08/12/2015   Procedure: CARDIOVERSION;  Surgeon: Chilton Si, MD;  Location: [facility] ENDOSCOPY;  Service: Cardiovascular;  Laterality: N/A;  . MOUTH SURGERY    . TEE WITHOUT CARDIOVERSION N/A 08/08/2015   Procedure: TRANSESOPHAGEAL ECHOCARDIOGRAM (TEE);  Surgeon: Lars Masson, MD;  Location: [facility] ENDOSCOPY;  Service: Cardiovascular;  Laterality: N/A;     Current Outpatient Medications  Medication Sig Dispense Refill  . atorvastatin (LIPITOR) 40 MG tablet Take 1 tablet (40 mg total) by mouth daily at 6 PM. 30 tablet 11  . carvedilol (COREG) 12.5 MG tablet Take 1 tablet (12.5 mg total) by mouth 2 (two) times daily with a meal. 180 tablet 3  . MAGNESIUM PO Take 1 tablet by mouth daily.    . potassium chloride (K-DUR) 10 MEQ tablet Take 1 tablet (10 mEq total) by mouth daily. <PLEASE MAKE APPOINTMENT FOR REFILLS> 90 tablet 0  . spironolactone (ALDACTONE) 25 MG tablet Take 1 tablet (25 mg total) by mouth daily. 90 tablet 3  . losartan (COZAAR) 50 MG tablet Take 1 tablet (50 mg total) by mouth daily. 90 tablet 3  . rivaroxaban (XARELTO) 20 MG TABS tablet Take 1 tablet (20 mg  total) by mouth daily with supper. 90 tablet 3   No current facility-administered medications for this visit.     Allergies:   Patient has no known allergies.     ROS:  Please see the history of present illness.   Otherwise, review of systems are positive for none.   All other systems are reviewed and negative.    PHYSICAL EXAM: VS:  BP 138/88   Pulse 87   Ht 5\' 10"  (1.778 m)   Wt 264 lb (119.7 kg)   BMI 37.88 kg/m  , BMI Body mass index is 37.88 kg/m.  GENERAL:  Well appearing NECK:  No jugular venous distention, waveform within normal limits, carotid upstroke brisk and symmetric, no bruits, no thyromegaly LUNGS:  Clear to auscultation  bilaterally CHEST:  Unremarkable HEART:  PMI not displaced or sustained,S1 and S2 within normal limits, no S3,  no clicks, no rubs, no murmurs, irregular ABD:  Flat, positive bowel sounds normal in frequency in pitch, no bruits, no rebound, no guarding, no midline pulsatile mass, no hepatomegaly, no splenomegaly EXT:  2 plus pulses throughout, no edema, no cyanosis no clubbing  EKG:  EKG is ordered today. Atrial fibrillation, rate 87, axis within normal limits, intervals within normal limits, no acute ST-T wave changes.  Recent Labs: 12/12/2017: Hemoglobin 17.1; Platelets 210 12/24/2017: BUN 11; Creatinine, Ser 1.21; Potassium 4.4; Sodium 141    Lipid Panel    Component Value Date/Time   CHOL 108 (L) 09/30/2015 0736   TRIG 93 09/30/2015 0736   HDL 47 09/30/2015 0736   CHOLHDL 2.3 09/30/2015 0736   VLDL 19 09/30/2015 0736   LDLCALC 42 09/30/2015 0736     Lab Results  Component Value Date   TSH 1.700 08/21/2016   ALT 5 08/21/2016   AST 7 08/21/2016   ALKPHOS 44 08/21/2016   BILITOT <0.2 08/21/2016   PROT 6.1 08/21/2016   ALBUMIN 3.9 08/21/2016     Wt Readings from Last 3 Encounters:  07/17/18 264 lb (119.7 kg)  01/20/18 240 lb (108.9 kg)  12/12/17 235 lb (106.6 kg)      Other studies Reviewed: Additional studies/ records that were reviewed today include:  Echo (images personally reviewed.)  Review of the above records demonstrates:     ASSESSMENT AND PLAN:    DILATED CARDIOMYOPATHY:      His ejection fraction was reduced.  I am going to stop his hydralazine.  And then start Cozaar 50 mg daily and we will titrate this.  He had no ischemia testing on a previous perfusion study.  This may have been rate related secondary to his fibrillation or related to hypertension.  Have been continue to titrate meds and when he comes back we could consider going up on his Cozaar.  I will check a basic metabolic profile in about 10 days.  ATRIAL FIB:    This is either persistent or  may be permanent.  Regardleobess he is not feeling it.  He is now on anticoagulation and tolerates this. [name] has a CHA2DS2 - VASc score of 2 if you consider his reduced ejection fraction of the heart failure.  He and I had a long discussion about this.  I can stop his aspirin.  He is can start Xarelto.  He is going to keep his own pulse which he can do and we discussed how to do this to make sure he has reasonable rate control.  He is had reasonable rate control when he  is had paroxysmal fibrillation in the past.  Of note his TSH was okay recently.  HTN:   The blood pressure slightly elevated and will be managed as above.   SLEEP APNEA: He is compliant with his mask.  OBESITY: Unfortunately he is gained about 24 pounds since his last visit we had a long discussion about this.  Hopefully he can do better with diet and exercise.  Current medicines are reviewed at length with the patient today.  The patient does not have concerns regarding medicines.  The following changes have been made:   As above  Labs/ tests ordered today include:    Orders Placed This Encounter  Procedures  . Basic metabolic panel  . EKG 12-Lead     Disposition:   FU with me in 6 months.    Signed, Rollene Rotunda, MD  07/17/2018 11:51 AM     [facility]

## 2018-07-17 ENCOUNTER — Encounter: Payer: Self-pay | Admitting: Cardiology

## 2018-07-17 ENCOUNTER — Ambulatory Visit: Payer: BLUE CROSS/BLUE SHIELD | Admitting: Cardiology

## 2018-07-17 VITALS — BP 138/88 | HR 87 | Ht 70.0 in | Wt 264.0 lb

## 2018-07-17 DIAGNOSIS — Z79899 Other long term (current) drug therapy: Secondary | ICD-10-CM | POA: Diagnosis not present

## 2018-07-17 DIAGNOSIS — E669 Obesity, unspecified: Secondary | ICD-10-CM | POA: Insufficient documentation

## 2018-07-17 DIAGNOSIS — I420 Dilated cardiomyopathy: Secondary | ICD-10-CM | POA: Diagnosis not present

## 2018-07-17 DIAGNOSIS — I4819 Other persistent atrial fibrillation: Secondary | ICD-10-CM

## 2018-07-17 DIAGNOSIS — I10 Essential (primary) hypertension: Secondary | ICD-10-CM | POA: Diagnosis not present

## 2018-07-17 MED ORDER — LOSARTAN POTASSIUM 50 MG PO TABS: 50.0000 mg | ORAL_TABLET | Freq: Every day | ORAL | 3 refills | Status: DC

## 2018-07-17 MED ORDER — RIVAROXABAN 20 MG PO TABS: 20.0000 mg | ORAL_TABLET | Freq: Every day | ORAL | 3 refills | Status: DC

## 2018-07-17 NOTE — Patient Instructions (Signed)
Medication Instructions:  Stop: Aspirin 81 mg          Hydralazine 50 mg  Start: Xarelto 20 mg daily           Cozaar 50 mg daily If you need a refill on your cardiac medications before your next appointment, please call your pharmacy.   Lab work: Your physician recommends that you return for lab work in 10 days (BMP)  If you have labs (blood work) drawn today and your tests are completely normal, you will receive your results only by: Marland Kitchen MyChart Message (if you have MyChart) OR . A paper copy in the mail If you have any lab test that is abnormal or we need to change your treatment, we will call you to review the results.  Testing/Procedures: None  Follow-Up: At [facility], you and your health needs are our priority.  As part of our continuing mission to provide you with exceptional heart care, we have created designated Provider Care Teams.  These Care Teams include your primary Cardiologist (physician) and Advanced Practice Providers (APPs -  Physician Assistants and Nurse Practitioners) who all work together to provide you with the care you need, when you need it.  Your physician recommends that you schedule a follow-up appointment in 1 month with an APP

## 2018-07-31 ENCOUNTER — Other Ambulatory Visit: Payer: BLUE CROSS/BLUE SHIELD

## 2018-08-07 DIAGNOSIS — Z79899 Other long term (current) drug therapy: Secondary | ICD-10-CM | POA: Diagnosis not present

## 2018-08-07 LAB — BASIC METABOLIC PANEL
BUN/Creatinine Ratio: 17 (ref 10–24)
BUN: 22 mg/dL (ref 8–27)
CO2: 21 mmol/L (ref 20–29)
Calcium: 9.7 mg/dL (ref 8.6–10.2)
Chloride: 101 mmol/L (ref 96–106)
Creatinine, Ser: 1.29 mg/dL — ABNORMAL HIGH (ref 0.76–1.27)
GFR calc Af Amer: 69 mL/min/{1.73_m2} (ref >59)
GFR calc non Af Amer: 60 mL/min/{1.73_m2} (ref >59)
Glucose: 115 mg/dL — ABNORMAL HIGH (ref 65–99)
Potassium: 4.6 mmol/L (ref 3.5–5.2)
SODIUM: 137 mmol/L (ref 134–144)

## 2018-08-11 ENCOUNTER — Telehealth: Payer: Self-pay | Admitting: Cardiology

## 2018-08-11 NOTE — Telephone Encounter (Signed)
New message   Patient states that he is returning your call.  Please return call.

## 2018-08-11 NOTE — Telephone Encounter (Signed)
Pt aware of his blood work  

## 2018-08-17 NOTE — Progress Notes (Signed)
Cardiology Office Note   Date:  08/18/2018   ID:  James [name], DOB [birth year], MRN [id number]  PCP:  Patient, No Pcp Per  Cardiologist: Hochrein  Chief Complaint  Patient presents with  . Hypertension  . Follow-up     History of Present Illness: James [name] is a 63 y.o. male who presents for ongoing assessment and management of atrial fibrillation, s/p TEE cardioversion which was unsuccessful, and was therefore treated with amiodarone. He underwent a second TEE cardioversion that was successful, but he was having PAF on follow up Holter. Other history includes dilated cardiomyopathy, with EF 45%-50% per echo 01/2018,  OSA seen by Dr. Mayford Knife and reported CPAP compliance.   During last office visit with Dr. 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 He was started on Xarelto for CHADS VASC Score of 2. ASA was discontinued. He is here for follow up. BMET on 08/07/2018 was essentially normal with mildly elevated creatinine of 1.29.   He is here today without any cardiac complaints. He is feeling well on Cozaar and denies any dizziness. He is medically compliant.   Past Medical History:  Diagnosis Date  . Asthma   . Former tobacco use   . Hypertension   . Morbid obesity (HCC)   . New onset atrial fibrillation (HCC) 07/2015  . Non-ischemic cardiomyopathy (HCC)   . Suspected sleep apnea     Past Surgical History:  Procedure Laterality Date  . CARDIOVERSION N/A 08/08/2015   Procedure: CARDIOVERSION;  Surgeon: Lars Masson, MD;  Location: [facility] ENDOSCOPY;  Service: Cardiovascular;  Laterality: N/A;  . CARDIOVERSION N/A 08/12/2015   Procedure: CARDIOVERSION;  Surgeon: Chilton Si, MD;  Location: [facility] ENDOSCOPY;  Service: Cardiovascular;  Laterality: N/A;  . MOUTH SURGERY    . TEE WITHOUT CARDIOVERSION N/A 08/08/2015   Procedure:  TRANSESOPHAGEAL ECHOCARDIOGRAM (TEE);  Surgeon: Lars Masson, MD;  Location: [facility] ENDOSCOPY;  Service: Cardiovascular;  Laterality: N/A;     Current Outpatient Medications  Medication Sig Dispense Refill  . atorvastatin (LIPITOR) 40 MG tablet Take 1 tablet (40 mg total) by mouth daily at 6 PM. 30 tablet 11  . carvedilol (COREG) 12.5 MG tablet Take 1 tablet (12.5 mg total) by mouth 2 (two) times daily with a meal. 180 tablet 3  . losartan (COZAAR) 50 MG tablet Take 1 tablet (50 mg total) by mouth daily. 90 tablet 3  . MAGNESIUM PO Take 1 tablet by mouth daily.    . potassium chloride (K-DUR) 10 MEQ tablet Take 1 tablet (10 mEq total) by mouth daily. <PLEASE MAKE APPOINTMENT FOR REFILLS> 90 tablet 0  . rivaroxaban (XARELTO) 20 MG TABS tablet Take 1 tablet (20 mg total) by mouth daily with supper. 90 tablet 3  . spironolactone (ALDACTONE) 25 MG tablet Take 1 tablet (25 mg total) by mouth daily. 90 tablet 3   No current facility-administered medications for this visit.     Allergies:   Patient has no known allergies.    Social History:  The patient  reports that he has quit smoking. He has never used smokeless tobacco. He reports that he does not drink alcohol or use drugs.   Family History:  The patient's family history includes CAD in his maternal aunt and mother.    ROS: All other systems are reviewed and negative. Unless otherwise mentioned in H&P  PHYSICAL EXAM: VS:  BP 128/62   Pulse 63   Ht 5\' 10"  (1.778 m)   Wt 271 lb 12.8 oz (123.3 kg)   SpO2 97%   BMI 39.00 kg/m  , BMI Body mass index is 39 kg/m. GEN: Well nourished, well developed, in no acute distress, obese  HEENT: normal Neck: no JVD, carotid bruits, or masses Cardiac: RRR; no murmurs, rubs, or gallops,no edema  Respiratory:  Clear to auscultation bilaterally, normal work of breathing GI: soft, nontender, nondistended, + BS MS: no deformity or atrophy Skin: warm and dry, no rash Neuro:  Strength and  sensation are intact Psych: euthymic mood, full affect  Recent Labs: 12/12/2017: Hemoglobin 17.1; Platelets 210 08/07/2018: BUN 22; Creatinine, Ser 1.29; Potassium 4.6; Sodium 137    Lipid Panel    Component Value Date/Time   CHOL 108 (L) 09/30/2015 0736   TRIG 93 09/30/2015 0736   HDL 47 09/30/2015 0736   CHOLHDL 2.3 09/30/2015 0736   VLDL 19 09/30/2015 0736   LDLCALC 42 09/30/2015 0736      Wt Readings from Last 3 Encounters:  08/18/18 271 lb 12.8 oz (123.3 kg)  07/17/18 264 lb (119.7 kg)  01/20/18 240 lb (108.9 kg)      Other studies Reviewed: None  ASSESSMENT AND PLAN:  1.  Atrial fib: He is doing well and remains in NSR at this time. He is medically complaint and doing well. No changes in his regimen.Will check TSH on amiodarone.   2. Hypertension: BP is well controlled today. No changes.  3. Hypercholesterolemia: He has not been check since 09/2015 per our records. He does not remember if his PCP has checked his status. He will have fasting lipids and LFT's.   Current medicines are reviewed at length with the patient today.    Labs/ tests ordered today include: Lipids and LFT's and TSH   Bettey Mare. Liborio Nixon, ANP, AACC   08/18/2018 4:15 PM    [facility] [address] Office [phone] Fax [phone]

## 2018-08-18 ENCOUNTER — Ambulatory Visit: Payer: BLUE CROSS/BLUE SHIELD | Admitting: Adult Health

## 2018-08-18 ENCOUNTER — Encounter: Payer: Self-pay | Admitting: Adult Health

## 2018-08-18 VITALS — BP 128/62 | HR 63 | Ht 70.0 in | Wt 271.8 lb

## 2018-08-18 DIAGNOSIS — I480 Paroxysmal atrial fibrillation: Secondary | ICD-10-CM | POA: Diagnosis not present

## 2018-08-18 DIAGNOSIS — I10 Essential (primary) hypertension: Secondary | ICD-10-CM

## 2018-08-18 DIAGNOSIS — Z79899 Other long term (current) drug therapy: Secondary | ICD-10-CM

## 2018-08-18 MED ORDER — RIVAROXABAN 20 MG PO TABS: 20.0000 mg | ORAL_TABLET | Freq: Every day | ORAL | 3 refills | Status: DC

## 2018-08-18 MED ORDER — SPIRONOLACTONE 25 MG PO TABS: 25.0000 mg | ORAL_TABLET | Freq: Every day | ORAL | 3 refills | Status: DC

## 2018-08-18 MED ORDER — CARVEDILOL 12.5 MG PO TABS: 12.5000 mg | ORAL_TABLET | Freq: Two times a day (BID) | ORAL | 3 refills | Status: DC

## 2018-08-18 MED ORDER — LOSARTAN POTASSIUM 50 MG PO TABS: 50.0000 mg | ORAL_TABLET | Freq: Every day | ORAL | 3 refills | Status: DC

## 2018-08-18 NOTE — Patient Instructions (Signed)
Medication Instructions:  NO CHANGES- Your physician recommends that you continue on your current medications as directed. Please refer to the Current Medication list given to you today. If you need a refill on your cardiac medications before your next appointment, please call your pharmacy.  Labwork: Lipid and LFT-FASTING HERE IN OUR OFFICE AT [facility]   You will need to fast. DO NOT EAT OR DRINK PAST MIDNIGHT.     Take the provided lab slips with you to the lab for your blood draw.    When you have your labs (blood work) drawn today and your tests are completely normal, you will receive your results only by MyChart Message (if you have MyChart) -OR-  A paper copy in the mail.  If you have any lab test that is abnormal or we need to change your treatment, we will call you to review these results.  Follow-Up: You will need a follow up appointment in 6 months.  Please call our office 2 (MAY 2020)  months in advance to schedule this (July 2020)  appointment.  You may see Rollene Rotunda, MD Joni Reining, DNP, AACC or one of the following Advanced Practice Providers on your designated Care Team:  Joni Reining, DNP, AACC  Theodore Demark, PA-C  At [facility], you and your health needs are our priority.  As part of our continuing mission to provide you with exceptional heart care, we have created designated Provider Care Teams.  These Care Teams include your primary Cardiologist (physician) and Advanced Practice Providers (APPs -  Physician Assistants and Nurse Practitioners) who all work together to provide you with the care you need, when you need it.  Thank you for choosing [facility] at [facility]!!

## 2018-10-13 DIAGNOSIS — G4733 Obstructive sleep apnea (adult) (pediatric): Secondary | ICD-10-CM | POA: Diagnosis not present

## 2019-01-13 ENCOUNTER — Telehealth: Payer: Self-pay | Admitting: *Deleted

## 2019-01-13 NOTE — Telephone Encounter (Signed)
A message was left, re: follow up visit. 

## 2019-01-26 NOTE — Telephone Encounter (Signed)
A message was left, re: follow up visit. 

## 2019-03-31 DIAGNOSIS — G4733 Obstructive sleep apnea (adult) (pediatric): Secondary | ICD-10-CM | POA: Diagnosis not present

## 2019-06-11 NOTE — Progress Notes (Addendum)
Cardiology Office Note   Date:  06/12/2019   ID:  James [name], DOB 01/18/1956, MRN [id number]  PCP:  Patient, No Pcp Per  Cardiologist:   Rollene Rotunda, MD   Chief Complaint  Patient presents with  . Cardiomyopathy      History of Present Illness: James [name] is a 63 y.o. male who presents for evaluation of atrial fibrillation. He was in the hospital in January 2017 with shortness of breath. He presented and was in atrial fibrillation. He subsequently needed TEE guided cardioversion. However, he did not convert to sinus rhythm with this and was treated with amiodarone. His second attempt at TEE cardioversion following this was successful. He was treated with IV diuresis. Of note he does have some renal insufficiency. He has a dilated cardiomyopathy with an EF of 20-25%. Stress testing demonstrated an inferior wall defect which may have been artifact or prior inferior infarct with scarring. However, there was no ischemia.  The EF on follow up in July 2017 was back to normal.  It was 45% on echo in 2019.  Last year he did go back into atrial fib.   However, this was found to be paroxysmal on fib.    He does not really notice whether his heart is out of rhythm except occasionally he thinks he feels a fluttering.  He does have any presyncope with this.  Denies any chest pressure, neck or arm discomfort.  He said no weight gain or edema.  He denies any new shortness of breath, PND or orthopnea.  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  He probably walks about 6 miles per day.  He does not think he has limitations with this.  Past Medical History:  Diagnosis Date  . Asthma   . Former tobacco use   . Hypertension   . Morbid obesity (HCC)   . New onset atrial fibrillation (HCC) 07/2015  . Non-ischemic cardiomyopathy (HCC)   . Suspected sleep apnea     Past Surgical History:  Procedure Laterality Date  . CARDIOVERSION N/A 08/08/2015   Procedure: CARDIOVERSION;  Surgeon:  Lars Masson, MD;  Location: [facility] ENDOSCOPY;  Service: Cardiovascular;  Laterality: N/A;  . CARDIOVERSION N/A 08/12/2015   Procedure: CARDIOVERSION;  Surgeon: Chilton Si, MD;  Location: [facility] ENDOSCOPY;  Service: Cardiovascular;  Laterality: N/A;  . MOUTH SURGERY    . TEE WITHOUT CARDIOVERSION N/A 08/08/2015   Procedure: TRANSESOPHAGEAL ECHOCARDIOGRAM (TEE);  Surgeon: Lars Masson, MD;  Location: [facility] ENDOSCOPY;  Service: Cardiovascular;  Laterality: N/A;     Current Outpatient Medications  Medication Sig Dispense Refill  . atorvastatin (LIPITOR) 40 MG tablet Take 1 tablet (40 mg total) by mouth daily at 6 PM. 30 tablet 11  . carvedilol (COREG) 25 MG tablet Take 1 tablet (25 mg total) by mouth 2 (two) times daily with a meal. 180 tablet 3  . MAGNESIUM PO Take 1 tablet by mouth daily.    . rivaroxaban (XARELTO) 20 MG TABS tablet Take 1 tablet (20 mg total) by mouth daily with supper. 90 tablet 3  . spironolactone (ALDACTONE) 25 MG tablet Take 1 tablet (25 mg total) by mouth daily. 90 tablet 3  . losartan (COZAAR) 50 MG tablet Take 1 tablet (50 mg total) by mouth daily. 90 tablet 3   No current facility-administered medications for this visit.     Allergies:   Patient has no known allergies.     ROS:  Please see the history of  present illness.   Otherwise, review of systems are positive for none.   All other systems are reviewed and negative.    PHYSICAL EXAM: VS:  BP 130/85   Pulse 81   Temp (!) 97 F (36.1 C)   Ht 5\' 10"  (1.778 m)   Wt 278 lb 12.8 oz (126.5 kg)   SpO2 97%   BMI 40.00 kg/m  , BMI Body mass index is 40 kg/m.  GENERAL:  Well appearing NECK:  No jugular venous distention, waveform within normal limits, carotid upstroke brisk and symmetric, no bruits, no thyromegaly LUNGS:  Clear to auscultation bilaterally CHEST:  Unremarkable HEART:  PMI not displaced or sustained,S1 and S2 within normal limits, no S3,  no clicks, no rubs, no murmurs, irregular ABD:   Flat, positive bowel sounds normal in frequency in pitch, no bruits, no rebound, no guarding, no midline pulsatile mass, no hepatomegaly, no splenomegaly EXT:  2 plus pulses throughout, no edema, no cyanosis no clubbing  EKG:  EKG is ordered today. Atrial fibrillation, rate 92, axis within normal limits, intervals within normal limits, no acute ST-T wave changes.  Recent Labs: 08/07/2018: BUN 22; Creatinine, Ser 1.29; Potassium 4.6; Sodium 137    Lipid Panel    Component Value Date/Time   CHOL 108 (L) 09/30/2015 0736   TRIG 93 09/30/2015 0736   HDL 47 09/30/2015 0736   CHOLHDL 2.3 09/30/2015 0736   VLDL 19 09/30/2015 0736   LDLCALC 42 09/30/2015 0736     Lab Results  Component Value Date   TSH 1.700 08/21/2016   ALT 5 08/21/2016   AST 7 08/21/2016   ALKPHOS 44 08/21/2016   BILITOT <0.2 08/21/2016   PROT 6.1 08/21/2016   ALBUMIN 3.9 08/21/2016     Wt Readings from Last 3 Encounters:  06/12/19 278 lb 12.8 oz (126.5 kg)  08/18/18 271 lb 12.8 oz (123.3 kg)  07/17/18 264 lb (119.7 kg)      Other studies Reviewed: Additional studies/ records that were reviewed today include:  Echo (images personally reviewed.)  Review of the above records demonstrates:     ASSESSMENT AND PLAN:    DILATED CARDIOMYOPATHY:     Today I am to slowly increase his medications by up titrating his carvedilol to 25 mg twice daily.  I like to do a telehealth visit with him in a few weeks and probably go up on his Cozaar.  So far he remains asymptomatic.   ATRIAL FIB:    [name] has a CHA2DS2 - VASc score of 2 if you consider his reduced ejection fraction of the heart failure.  He does not notice when he is in this rhythm.  Is been paroxysmal as noted above although I would not have any idea whether it has become more persistent or even permanent.  However, I do not see an indication to further manage this with rhythm management as he is not having symptoms.  He tolerates anticoagulation.   HTN:   This is being treated in the context of managing his cardiomyopathy.   SLEEP APNEA: He is compliant with his mask.  No change in therapy.   OBESITY:   We talked about this again today and gave him specific instructions for weight loss.  Current medicines are reviewed at length with the patient today.  The patient does not have concerns regarding medicines.  The following changes have been made:   As above.   Labs/ tests ordered today include:  None  Orders  Placed This Encounter  Procedures  . EKG 12-Lead     Disposition:   FU with me in 1 month virtually.    Signed, Rollene Rotunda, MD  06/12/2019 12:41 PM    [facility]

## 2019-06-12 ENCOUNTER — Ambulatory Visit: Payer: BC Managed Care – PPO | Admitting: Cardiology

## 2019-06-12 ENCOUNTER — Other Ambulatory Visit: Payer: Self-pay

## 2019-06-12 ENCOUNTER — Encounter: Payer: Self-pay | Admitting: Cardiology

## 2019-06-12 VITALS — BP 130/85 | HR 81 | Temp 97.0°F | Ht 70.0 in | Wt 278.8 lb

## 2019-06-12 DIAGNOSIS — I10 Essential (primary) hypertension: Secondary | ICD-10-CM | POA: Diagnosis not present

## 2019-06-12 DIAGNOSIS — I482 Chronic atrial fibrillation, unspecified: Secondary | ICD-10-CM | POA: Diagnosis not present

## 2019-06-12 DIAGNOSIS — I420 Dilated cardiomyopathy: Secondary | ICD-10-CM | POA: Diagnosis not present

## 2019-06-12 MED ORDER — CARVEDILOL 25 MG PO TABS: 25.0000 mg | ORAL_TABLET | Freq: Two times a day (BID) | ORAL | 3 refills | Status: DC

## 2019-06-12 NOTE — Patient Instructions (Signed)
Medication Instructions:  INCREASE CARVEDILOL TO 25 MG TWICE DAILY= 2 OF THE 12.5 MG TABLETS TWICE DAILY  *If you need a refill on your cardiac medications before your next appointment, please call your pharmacy*  Lab Work: If you have labs (blood work) drawn today and your tests are completely normal, you will receive your results only by: Marland Kitchen MyChart Message (if you have MyChart) OR . A paper copy in the mail If you have any lab test that is abnormal or we need to change your treatment, we will call you to review the results.  Follow-Up: At [facility] you and your health needs are our priority.  As part of our continuing mission to provide you with exceptional heart care, we have created designated Provider Care Teams.  These Care Teams include your primary Cardiologist (physician) and Advanced Practice Providers (APPs -  Physician Assistants and Nurse Practitioners) who all work together to provide you with the care you need, when you need it.  Your next appointment:   ONE MONTH  The format for your next appointment:   Virtual Visit   Provider:   Minus Breeding, MD

## 2019-06-29 DIAGNOSIS — G4733 Obstructive sleep apnea (adult) (pediatric): Secondary | ICD-10-CM | POA: Diagnosis not present

## 2019-07-12 NOTE — Progress Notes (Addendum)
Virtual Visit via Telephone Note   This visit type was conducted due to national recommendations for restrictions regarding the COVID-19 Pandemic (e.g. social distancing) in an effort to limit this patient's exposure and mitigate transmission in our community.  Due to his co-morbid illnesses, this patient is at least at moderate risk for complications without adequate follow up.  This format is felt to be most appropriate for this patient at this time.  The patient did not have access to video technology/had technical difficulties with video requiring transitioning to audio format only (telephone).  All issues noted in this document were discussed and addressed.  No physical exam could be performed with this format.  Please refer to the patient's chart for his  consent to telehealth for [facility].   Date:  07/13/2019   ID:  Willette Pa, DOB May 15, 1956, MRN [id number]  Patient Location: Home Provider Location: Home  PCP:  Patient, No Pcp Per  Cardiologist:  Minus Breeding, MD  Electrophysiologist:  None   Evaluation Performed:  Follow-Up Visit  Chief Complaint:  Palpitations  History of Present Illness:    Imir Brumbach is a 63 y.o. male who presents for evaluation of atrial fib.   He was in the hospital in January 2017 with shortness of breath. He presented and was in atrial fibrillation. He subsequently needed TEE guided cardioversion. However, he did not convert to sinus rhythm with this and was treated with amiodarone. His second attempt at TEE cardioversion following this was successful. He has a dilated cardiomyopathy with an EF of 20-25%. Stress testing demonstrated an inferior wall defect which may have been artifact or prior inferior infarct with scarring. However, there was no ischemia.  The EF on follow up in July 2017 was back to normal.  It was 45% on echo in 2019.  Last year he did go back into atrial fib.   However, this was found to be paroxysmal on fib.  At the last visit  I increased his Coreg.    Since I last saw him he has done well.  He denies any cardiovascular symptoms.  He does not feel palpitations.  He has no presyncope or syncope.  He has no new shortness of breath, PND or orthopnea.  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  He denies any cardiovascular symptoms with this.  The patient does not have symptoms concerning for COVID-19 infection (fever, chills, cough, or new shortness of breath).    Past Medical History:  Diagnosis Date  . Asthma   . Former tobacco use   . Hypertension   . Morbid obesity ([place])   . New onset atrial fibrillation ([place]) 07/2015  . Non-ischemic cardiomyopathy ([place])   . Suspected sleep apnea    Past Surgical History:  Procedure Laterality Date  . CARDIOVERSION N/A 08/08/2015   Procedure: CARDIOVERSION;  Surgeon: Dorothy Spark, MD;  Location: [facility] ENDOSCOPY;  Service: Cardiovascular;  Laterality: N/A;  . CARDIOVERSION N/A 08/12/2015   Procedure: CARDIOVERSION;  Surgeon: Skeet Latch, MD;  Location: [place];  Service: Cardiovascular;  Laterality: N/A;  . MOUTH SURGERY    . TEE WITHOUT CARDIOVERSION N/A 08/08/2015   Procedure: TRANSESOPHAGEAL ECHOCARDIOGRAM (TEE);  Surgeon: Dorothy Spark, MD;  Location: [place];  Service: Cardiovascular;  Laterality: N/A;     Prior to Admission medications   Medication Sig Start Date End Date Taking? Authorizing Provider  ascorbic acid (VITAMIN C) 500 MG tablet Take 500 mg by mouth daily.  Yes [provider]  atorvastatin (LIPITOR) 40 MG tablet Take 1 tablet (40 mg total) by mouth daily at 6 PM. 08/12/15  Yes Dwana Melena, PA-C  carvedilol (COREG) 25 MG tablet Take 1 tablet (25 mg total) by mouth 2 (two) times daily with a meal. 06/12/19  Yes Rollene Rotunda, MD  MAGNESIUM PO Take 1 tablet by mouth daily.   Yes [provider]  rivaroxaban (XARELTO) 20 MG TABS tablet Take 1 tablet (20 mg total) by mouth daily with supper. 08/18/18  Yes  Jodelle Gross, NP  spironolactone (ALDACTONE) 25 MG tablet Take 1 tablet (25 mg total) by mouth daily. 08/18/18  Yes Jodelle Gross, NP  Zinc 30 MG TABS Take 30 mg by mouth daily. Once daily   Yes [provider]  losartan (COZAAR) 50 MG tablet Take 1 tablet (50 mg total) by mouth daily. 08/18/18 11/16/18  Jodelle Gross, NP     Allergies:   Patient has no known allergies.   Social History   Tobacco Use  . Smoking status: Former Games developer  . Smokeless tobacco: Never Used  . Tobacco comment: Smoked for 20 years, quit ~1995  Substance Use Topics  . Alcohol use: No    Alcohol/week: 0.0 standard drinks  . Drug use: No     Family Hx: The patient's family history includes CAD in his maternal aunt and mother.  ROS:   Please see the history of present illness.     All other systems reviewed and are negative.   Prior CV studies:   The following studies were reviewed today:  None  Labs/Other Tests and Data Reviewed:    EKG:  No ECG reviewed.  Recent Labs: 08/07/2018: BUN 22; Creatinine, Ser 1.29; Potassium 4.6; Sodium 137   Recent Lipid Panel Lab Results  Component Value Date/Time   CHOL 108 (L) 09/30/2015 07:36 AM   TRIG 93 09/30/2015 07:36 AM   HDL 47 09/30/2015 07:36 AM   CHOLHDL 2.3 09/30/2015 07:36 AM   LDLCALC 42 09/30/2015 07:36 AM    Wt Readings from Last 3 Encounters:  06/12/19 278 lb 12.8 oz (126.5 kg)  08/18/18 271 lb 12.8 oz (123.3 kg)  07/17/18 264 lb (119.7 kg)     Objective:    Vital Signs:  BP 122/72   Pulse 63    VITAL SIGNS:  reviewed  ASSESSMENT & PLAN:     DILATED CARDIOMYOPATHY:      Today I am going to increase his [place] to 75 mg daily.  He can take 50 mg in the morning and 20 5 in the afternoon.  He will remain on the other meds as listed.  He needs a basic metabolic profile in 2 weeks.   ATRIAL FIB:    Mr. Mercedes Goodlow has a CHA2DS2 - VASc score of 2 if you consider his reduced ejection fraction of the heart  failure.    I will check a CBC.   HTN:   This is being treated in the context of managing his heart failure.   SLEEP APNEA:   He is compliant with his CPAP.  OBESITY:    We talked again about weight loss and he says he lost a little weight but he did not have a scale today.  COVID-19 Education: The signs and symptoms of COVID-19 were discussed with the patient and how to seek care for testing (follow up with PCP or arrange E-visit).  We talked about the vaccine.  The importance  of social distancing was discussed today.  Time:   Today, I have spent 16 minutes with the patient with telehealth technology discussing the above problems.     Medication Adjustments/Labs and Tests Ordered: Current medicines are reviewed at length with the patient today.  Concerns regarding medicines are outlined above.   Tests Ordered: Orders Placed This Encounter  Procedures  . CBC  . Basic metabolic panel    Medication Changes: Meds ordered this encounter  Medications  . carvedilol (COREG) 25 MG tablet    Sig: Take two tablets (50 mg) in the morning and one tablet (25 mg) in the afternoon.    Dispense:  180 tablet    Refill:  3    Follow Up:  In Person six months  Signed, Rollene Rotunda, MD  07/13/2019 11:18 AM    [facility]

## 2019-07-13 ENCOUNTER — Telehealth (INDEPENDENT_AMBULATORY_CARE_PROVIDER_SITE_OTHER): Payer: BC Managed Care – PPO | Admitting: Cardiology

## 2019-07-13 ENCOUNTER — Telehealth: Payer: Self-pay

## 2019-07-13 ENCOUNTER — Encounter: Payer: Self-pay | Admitting: Cardiology

## 2019-07-13 VITALS — BP 122/72 | HR 63

## 2019-07-13 DIAGNOSIS — I5020 Unspecified systolic (congestive) heart failure: Secondary | ICD-10-CM | POA: Diagnosis not present

## 2019-07-13 DIAGNOSIS — I420 Dilated cardiomyopathy: Secondary | ICD-10-CM | POA: Diagnosis not present

## 2019-07-13 DIAGNOSIS — E669 Obesity, unspecified: Secondary | ICD-10-CM

## 2019-07-13 DIAGNOSIS — I110 Hypertensive heart disease with heart failure: Secondary | ICD-10-CM

## 2019-07-13 DIAGNOSIS — G4730 Sleep apnea, unspecified: Secondary | ICD-10-CM

## 2019-07-13 DIAGNOSIS — I4891 Unspecified atrial fibrillation: Secondary | ICD-10-CM | POA: Diagnosis not present

## 2019-07-13 DIAGNOSIS — I10 Essential (primary) hypertension: Secondary | ICD-10-CM

## 2019-07-13 DIAGNOSIS — I480 Paroxysmal atrial fibrillation: Secondary | ICD-10-CM

## 2019-07-13 MED ORDER — CARVEDILOL 25 MG PO TABS: 25.0000 mg | ORAL_TABLET | Freq: Two times a day (BID) | ORAL | 3 refills | Status: DC

## 2019-07-13 MED ORDER — CARVEDILOL 25 MG PO TABS: ORAL_TABLET | ORAL | 3 refills | Status: DC

## 2019-07-13 MED ORDER — LOSARTAN POTASSIUM 50 MG PO TABS: ORAL_TABLET | ORAL | 3 refills | Status: DC

## 2019-07-13 NOTE — Addendum Note (Signed)
Addended by: Annita Brod on: 07/13/2019 04:37 PM   Modules accepted: Orders

## 2019-07-13 NOTE — Patient Instructions (Addendum)
Medication Instructions:  Your physician has recommended you make the following change in your medication:  Take one tablet (50 mg) of losartan (Cozaar) in the morning and half a tablet (25 mg) in the afternoon.   *If you need a refill on your cardiac medications before your next appointment, please call your pharmacy*  Lab Work: Your physician recommends that you return for lab work in 2 weeks: Drew If you have labs (blood work) drawn today and your tests are completely normal, you will receive your results only by: Marland Kitchen MyChart Message (if you have MyChart) OR . A paper copy in the mail If you have any lab test that is abnormal or we need to change your treatment, we will call you to review the results.  Testing/Procedures: NONE  Follow-Up: At [facility], you and your health needs are our priority.  As part of our continuing mission to provide you with exceptional heart care, we have created designated Provider Care Teams.  These Care Teams include your primary Cardiologist (physician) and Advanced Practice Providers (APPs -  Physician Assistants and Nurse Practitioners) who all work together to provide you with the care you need, when you need it.  Your next appointment:   6 month(s)  The format for your next appointment:   Either In Person or Virtual  Provider:   Minus Breeding, MD

## 2019-07-14 NOTE — Telephone Encounter (Signed)
Spoke with pt and reviewed 12/14 AVS instructions. Informed pt that AVS available on mychart. Pt verbalized understanding

## 2019-09-23 ENCOUNTER — Other Ambulatory Visit: Payer: Self-pay | Admitting: Adult Health

## 2019-10-28 ENCOUNTER — Other Ambulatory Visit: Payer: Self-pay

## 2019-10-28 MED ORDER — SPIRONOLACTONE 25 MG PO TABS: ORAL_TABLET | ORAL | 2 refills | Status: DC

## 2020-04-11 ENCOUNTER — Other Ambulatory Visit: Payer: Self-pay | Admitting: Cardiology

## 2020-07-03 ENCOUNTER — Other Ambulatory Visit: Payer: Self-pay | Admitting: Adult Health

## 2021-06-05 ENCOUNTER — Telehealth: Payer: Self-pay | Admitting: Cardiology

## 2021-06-05 ENCOUNTER — Other Ambulatory Visit: Payer: Self-pay | Admitting: Cardiology

## 2021-06-05 DIAGNOSIS — I480 Paroxysmal atrial fibrillation: Secondary | ICD-10-CM

## 2021-06-05 DIAGNOSIS — I420 Dilated cardiomyopathy: Secondary | ICD-10-CM

## 2021-06-05 DIAGNOSIS — E669 Obesity, unspecified: Secondary | ICD-10-CM

## 2021-06-05 MED ORDER — CARVEDILOL 25 MG PO TABS: ORAL_TABLET | ORAL | 1 refills | Status: DC

## 2021-06-05 MED ORDER — LOSARTAN POTASSIUM 50 MG PO TABS: ORAL_TABLET | ORAL | 1 refills | Status: DC

## 2021-06-05 MED ORDER — SPIRONOLACTONE 25 MG PO TABS: ORAL_TABLET | ORAL | 1 refills | Status: DC

## 2021-06-05 MED ORDER — RIVAROXABAN 20 MG PO TABS: ORAL_TABLET | ORAL | 0 refills | Status: DC

## 2021-06-05 MED ORDER — LOSARTAN POTASSIUM 50 MG PO TABS
ORAL_TABLET | ORAL | 1 refills | Status: DC
Start: 2021-06-05 — End: 2021-06-05

## 2021-06-05 MED ORDER — SPIRONOLACTONE 25 MG PO TABS
ORAL_TABLET | ORAL | 1 refills | Status: DC
Start: 2021-06-05 — End: 2021-06-09

## 2021-06-05 NOTE — Telephone Encounter (Signed)
Pharmacy was changed in system. Pt wanted his medications sent to the mail order service. Carvedilol, Losartan and Aldactone was sent to pts corrected pharmacy.  CV DIV NL ANTICOAG- please send Noel Christmas to corrected pharmacy.

## 2021-06-05 NOTE — Telephone Encounter (Signed)
Prescription refill request for Xarelto received.  Indication:Afib Last office visit:upcoming Weight:126.5 kg Age:65 OZD:GUYQI labs CrCl:needs labs  Prescription refilled

## 2021-06-05 NOTE — Telephone Encounter (Signed)
*  STAT* If patient is at the pharmacy, call can be transferred to refill team.   1. Which medications need to be refilled? (please list name of each medication and dose if known)  XARELTO 20 MG TABS tablet losartan (COZAAR) 50 MG tablet spironolactone (ALDACTONE) 25 MG tablet carvedilol (COREG) 25 MG tablet   2. Which pharmacy/location (including street and city if local pharmacy) is medication to be sent to? [facility] - [place], [place] - [address]  Please use discount code for his Xarelto.   3. Do they need a 30 day or 90 day supply? 90 day

## 2021-06-05 NOTE — Telephone Encounter (Signed)
Spoke to patient .  He states he has not had any labwork this year. RN informed patient to have the labs completed prior to appointment  Patient states he will go to [address] office for labs on 06/12/21.  Order placed.

## 2021-06-05 NOTE — Telephone Encounter (Signed)
Patient would like to know if he needs blood work done prior to his appt on 12/21.

## 2021-06-05 NOTE — Telephone Encounter (Signed)
*  STAT* If patient is at the pharmacy, call can be transferred to refill team.   1. Which medications need to be refilled? (please list name of each medication and dose if known)  carvedilol (COREG) 25 MG tablet; losartan (COZAAR) 50 MG tablet; spironolactone (ALDACTONE) 25 MG tablet; rivaroxaban (XARELTO) 20 MG TABS tablet  2. Which pharmacy/location (including street and city if local pharmacy) is medication to be sent to?  [facility] - [place], [place] - [address]  3. Do they need a 30 day or 90 day supply? 90 day supply for all medications  Patient has a promotional discount for rivaroxaban (XARELTO) 20 MG TABS tablet

## 2021-06-09 ENCOUNTER — Telehealth: Payer: Self-pay | Admitting: Cardiology

## 2021-06-09 ENCOUNTER — Other Ambulatory Visit: Payer: Self-pay

## 2021-06-09 MED ORDER — LOSARTAN POTASSIUM 50 MG PO TABS: ORAL_TABLET | ORAL | 0 refills | Status: DC

## 2021-06-09 MED ORDER — SPIRONOLACTONE 25 MG PO TABS: ORAL_TABLET | ORAL | 0 refills | Status: DC

## 2021-06-09 MED ORDER — CARVEDILOL 25 MG PO TABS: ORAL_TABLET | ORAL | 0 refills | Status: DC

## 2021-06-09 MED ORDER — ATORVASTATIN CALCIUM 40 MG PO TABS: 40.0000 mg | ORAL_TABLET | Freq: Every day | ORAL | 11 refills | Status: DC

## 2021-06-09 NOTE — Telephone Encounter (Signed)
*  STAT* If patient is at the pharmacy, call can be transferred to refill team.   1. Which medications need to be refilled? (please list name of each medication and dose if known)  atorvastatin (LIPITOR) 40 MG tablet carvedilol (COREG) 25 MG tablet losartan (COZAAR) 50 MG tablet spironolactone (ALDACTONE) 25 MG tablet  2. Which pharmacy/location (including street and city if local pharmacy) is medication to be sent to?   [facility] - [place] - [address]  3. Do they need a 30 day or 90 day supply? 90 with refills  Patient says his insurance does not cover [facility] anymore. He said "all of his medications" need to go to [facility]

## 2021-06-12 ENCOUNTER — Other Ambulatory Visit: Payer: BC Managed Care – PPO

## 2021-06-12 ENCOUNTER — Other Ambulatory Visit: Payer: Self-pay

## 2021-06-12 ENCOUNTER — Telehealth: Payer: Self-pay | Admitting: Cardiology

## 2021-06-12 DIAGNOSIS — I420 Dilated cardiomyopathy: Secondary | ICD-10-CM

## 2021-06-12 DIAGNOSIS — I480 Paroxysmal atrial fibrillation: Secondary | ICD-10-CM

## 2021-06-12 DIAGNOSIS — E669 Obesity, unspecified: Secondary | ICD-10-CM

## 2021-06-12 LAB — LIPID PANEL
Chol/HDL Ratio: 4.8 ratio (ref 0.0–5.0)
Cholesterol, Total: 188 mg/dL (ref 100–199)
HDL: 39 mg/dL — ABNORMAL LOW (ref >39)
LDL Chol Calc (NIH): 128 mg/dL — ABNORMAL HIGH (ref 0–99)
Triglycerides: 115 mg/dL (ref 0–149)
VLDL Cholesterol Cal: 21 mg/dL (ref 5–40)

## 2021-06-12 LAB — COMPREHENSIVE METABOLIC PANEL
ALT: 27 IU/L (ref 0–44)
AST: 20 IU/L (ref 0–40)
Albumin/Globulin Ratio: 1.9 (ref 1.2–2.2)
Albumin: 4.5 g/dL (ref 3.8–4.8)
Alkaline Phosphatase: 63 IU/L (ref 44–121)
BUN/Creatinine Ratio: 15 (ref 10–24)
BUN: 17 mg/dL (ref 8–27)
Bilirubin Total: 0.6 mg/dL (ref 0.0–1.2)
CO2: 21 mmol/L (ref 20–29)
Calcium: 9.8 mg/dL (ref 8.6–10.2)
Chloride: 102 mmol/L (ref 96–106)
Creatinine, Ser: 1.17 mg/dL (ref 0.76–1.27)
Globulin, Total: 2.4 g/dL (ref 1.5–4.5)
Glucose: 179 mg/dL — ABNORMAL HIGH (ref 70–99)
Potassium: 4.5 mmol/L (ref 3.5–5.2)
Sodium: 138 mmol/L (ref 134–144)
Total Protein: 6.9 g/dL (ref 6.0–8.5)
eGFR: 70 mL/min/{1.73_m2} (ref >59)

## 2021-06-12 LAB — CBC
Hematocrit: 48.7 % (ref 37.5–51.0)
Hemoglobin: 16.5 g/dL (ref 13.0–17.7)
MCH: 30.1 pg (ref 26.6–33.0)
MCHC: 33.9 g/dL (ref 31.5–35.7)
MCV: 89 fL (ref 79–97)
Platelets: 172 10*3/uL (ref 150–450)
RBC: 5.48 x10E6/uL (ref 4.14–5.80)
RDW: 12 % (ref 11.6–15.4)
WBC: 7 10*3/uL (ref 3.4–10.8)

## 2021-06-12 NOTE — Telephone Encounter (Signed)
*  STAT* If patient is at the pharmacy, call can be transferred to refill team.   1. Which medications need to be refilled? (please list name of each medication and dose if known) carvedilol (COREG) 25 MG tablet 90ds  rivaroxaban (XARELTO) 20 MG TABS tablet TAKE 1 TABLET(20 MG) BY MOUTH DAILY WITH SUPPER  30ds  spironolactone (ALDACTONE) 25 MG tablet 30ds  losartan (COZAAR) 50 MG tablet 45ds  2. Which pharmacy/location (including street and city if local pharmacy) is medication to be sent to? [facility] [address] [place], [place], [place] [address]   3. Do they need a 30 day or 90 day supply? carvedilol (COREG) 25 MG tablet 90ds  rivaroxaban (XARELTO) 20 MG TABS tablet TAKE 1 TABLET(20 MG) BY MOUTH DAILY WITH SUPPER  30ds  spironolactone (ALDACTONE) 25 MG tablet 30ds  losartan (COZAAR) 50 MG tablet 45ds

## 2021-06-13 MED ORDER — RIVAROXABAN 20 MG PO TABS: ORAL_TABLET | ORAL | 0 refills | Status: DC

## 2021-06-13 MED ORDER — CARVEDILOL 25 MG PO TABS: ORAL_TABLET | ORAL | 0 refills | Status: DC

## 2021-06-13 MED ORDER — SPIRONOLACTONE 25 MG PO TABS: ORAL_TABLET | ORAL | 0 refills | Status: DC

## 2021-06-13 MED ORDER — LOSARTAN POTASSIUM 50 MG PO TABS: ORAL_TABLET | ORAL | 0 refills | Status: DC

## 2021-06-13 NOTE — Telephone Encounter (Signed)
   Pt is calling back, he still cant get his prescription at [facility] because he cant use the coupon for eliquis. he requested to send it to [facility], [address], [place], [place] [address], Phone: [phone] please

## 2021-06-13 NOTE — Telephone Encounter (Signed)
Called pt he states that he needs #90 refills for 4 medications spironolactone, losartan, carvedilol and xarelto (has copay card) sent to [facility] in [place]. Pt notified I changed pharmacy and refilled as requested. Notified he will need to keep follow up appointment for refills, verbalized understanding. At appt he will ask for refills.

## 2021-06-13 NOTE — Addendum Note (Signed)
Addended by: Alyson Ingles on: 06/13/2021 01:45 PM   Modules accepted: Orders

## 2021-06-14 ENCOUNTER — Other Ambulatory Visit: Payer: Self-pay | Admitting: *Deleted

## 2021-06-14 ENCOUNTER — Encounter: Payer: Self-pay | Admitting: *Deleted

## 2021-06-14 DIAGNOSIS — E785 Hyperlipidemia, unspecified: Secondary | ICD-10-CM

## 2021-06-14 MED ORDER — ATORVASTATIN CALCIUM 40 MG PO TABS: 40.0000 mg | ORAL_TABLET | Freq: Every day | ORAL | 3 refills | Status: DC

## 2021-06-14 NOTE — Telephone Encounter (Signed)
This encounter was created in error - please disregard.

## 2021-07-15 ENCOUNTER — Other Ambulatory Visit: Payer: Self-pay | Admitting: Cardiology

## 2021-07-17 NOTE — Telephone Encounter (Signed)
Prescription refill request for Xarelto received.  Indication:Afib Last office visit:upcoming Weight:126.5 kg Age:65 Scr:1.1 CrCl:122.9 ml/min  Prescription refilled

## 2021-07-17 NOTE — Progress Notes (Signed)
Cardiology Office Note   Date:  07/19/2021   ID:  James [name], DOB 08-28-1955, MRN [id number]  PCP:  Patient, No Pcp Per (Inactive)  Cardiologist:   James Rotunda, MD   Chief Complaint  Patient presents with   Atrial Fibrillation      History of Present Illness: James [name] is a 65 y.o. male who presents for evaluation of atrial fib.   He was in the office in 2020.  with shortness of breath.  In 2017 he had atrial fibrillation. He subsequently needed TEE guided cardioversion. However, he did not convert to sinus rhythm with this and was treated with amiodarone. His second attempt at TEE cardioversion following this was successful. He has a dilated cardiomyopathy with an EF of 20-25%. Stress testing demonstrated an inferior wall defect which may have been artifact or prior inferior infarct with scarring. However, there was no ischemia.  The EF on follow up in July 2017 was back to normal.  It was 45% on echo in 2019.  Last year he did go back into atrial fib.   However, this was found to be paroxysmal on fib.  At the last visit I increased his Coreg.    He returns for follow up.  He has done well since I last saw him.  He occasionally feels his heart skipping but his atrial fibrillation is really bothering him. James [name]   Past Medical History:  Diagnosis Date   Asthma    Former tobacco use    Hypertension    Morbid obesity (HCC)    New onset atrial fibrillation (HCC) 07/2015   Non-ischemic cardiomyopathy (HCC)    Suspected sleep apnea     Past Surgical History:  Procedure Laterality Date   CARDIOVERSION N/A 08/08/2015   Procedure: CARDIOVERSION;  Surgeon: James Masson, MD;  Location: [facility] ENDOSCOPY;  Service: Cardiovascular;  Laterality: N/A;   CARDIOVERSION N/A 08/12/2015   Procedure: CARDIOVERSION;  Surgeon: James Si, MD;  Location: [facility] ENDOSCOPY;  Service: Cardiovascular;  Laterality: N/A;   MOUTH SURGERY     TEE WITHOUT CARDIOVERSION N/A 08/08/2015   Procedure:  TRANSESOPHAGEAL ECHOCARDIOGRAM (TEE);  Surgeon: James Masson, MD;  Location: [facility] ENDOSCOPY;  Service: Cardiovascular;  Laterality: N/A;     Current Outpatient Medications  Medication Sig Dispense Refill   ascorbic acid (VITAMIN C) 500 MG tablet Take 500 mg by mouth daily.     atorvastatin (LIPITOR) 40 MG tablet Take 1 tablet (40 mg total) by mouth daily at 6 PM. 90 tablet 3   carvedilol (COREG) 25 MG tablet TAKE 2 TABLETS BY MOUTH IN THE MORNING AND 1 TABLET IN THE AFTERNOON 90 tablet 0   losartan (COZAAR) 50 MG tablet TAKE ONE TABLET IN THE MORNING AND ONE-HALF TABLET IN THE AFTERNOON 135 tablet 0   MAGNESIUM PO Take 1 tablet by mouth daily.     rivaroxaban (XARELTO) 20 MG TABS tablet TAKE 1 TABLET BY MOUTH DAILY WITH SUPPER 30 tablet 0   spironolactone (ALDACTONE) 25 MG tablet TAKE ONE TABLET BY MOUTH DAILY 90 tablet 0   No current facility-administered medications for this visit.    Allergies:   Patient has no known allergies.    ROS:  Please see the history of present illness.   Otherwise, review of systems are positive for none.   All other systems are reviewed and negative.    PHYSICAL EXAM: VS:  BP 122/78 (BP Location: Left Arm, Patient Position: Sitting, Cuff Size: Normal)  Pulse 86    Resp 20    Ht 5\' 10"  (1.778 m)    Wt 280 lb 12.8 oz (127.4 kg)    SpO2 96%    BMI 40.29 kg/m  , BMI Body mass index is 40.29 kg/m. GENERAL:  Well appearing NECK:  No jugular venous distention, waveform within normal limits, carotid upstroke brisk and symmetric, no bruits, no thyromegaly LUNGS:  Clear to auscultation bilaterally CHEST:  Unremarkable HEART:  PMI not displaced or sustained,S1 and S2 within normal limits, no S3, no S4, no clicks, no rubs, no murmurs ABD:  Flat, positive bowel sounds normal in frequency in pitch, no bruits, no rebound, no guarding, no midline pulsatile mass, no hepatomegaly, no splenomegaly EXT:  2 plus pulses throughout, no edema, no cyanosis no  clubbing   EKG:  EKG is ordered today. The ekg ordered today demonstrates atrial fibrillation, rate 106, axis within normal limits, intervals within normal limits, no acute ST-T wave changes.   Recent Labs: 06/12/2021: ALT 27; BUN 17; Creatinine, Ser 1.17; Hemoglobin 16.5; Platelets 172; Potassium 4.5; Sodium 138    Lipid Panel    Component Value Date/Time   CHOL 188 06/12/2021 0954   TRIG 115 06/12/2021 0954   HDL 39 (L) 06/12/2021 0954   CHOLHDL 4.8 06/12/2021 0954   CHOLHDL 2.3 09/30/2015 0736   VLDL 19 09/30/2015 0736   LDLCALC 128 (H) 06/12/2021 0954      Wt Readings from Last 3 Encounters:  07/19/21 280 lb 12.8 oz (127.4 kg)  06/12/19 278 lb 12.8 oz (126.5 kg)  08/18/18 271 lb 12.8 oz (123.3 kg)      Other studies Reviewed: Additional studies/ records that were reviewed today include: Labs. Review of the above records demonstrates:  Please see elsewhere in the note.     ASSESSMENT AND PLAN:   DILATED CARDIOMYOPATHY:     I will check an echocardiogram.  He is taking the meds as listed but if his ejection fraction is reduced at all below the 45% in particular I would change to [facility].    ATRIAL FIB:    James [name] has a CHA2DS2 - VASc score of 2 .  I do not think there is any indication for attempted ablation at this point.  He feels much better without amiodarone and has maintained sinus rhythm despite cardioversion.  He has no symptoms.  He tolerates anticoagulation.  At this point I will follow this with rate control and anticoagulation.   HTN:  This is being managed in the context of treating his CHF    SLEEP APNEA:   He is compliant with his CPAP.  OBESITY:   He tries to lose weight and we talked about low carbohydrate.     Current medicines are reviewed at length with the patient today.  The patient does not have concerns regarding medicines.  The following changes have been made:  no change  Labs/ tests ordered today include:   Orders Placed  This Encounter  Procedures   EKG 12-Lead   ECHOCARDIOGRAM COMPLETE     Disposition:   FU with me in one year.     Signed, James [name] Breeding, MD  07/19/2021 9:44 AM    [facility]

## 2021-07-19 ENCOUNTER — Other Ambulatory Visit: Payer: Self-pay

## 2021-07-19 ENCOUNTER — Ambulatory Visit: Payer: BC Managed Care – PPO | Admitting: Cardiology

## 2021-07-19 ENCOUNTER — Encounter: Payer: Self-pay | Admitting: Cardiology

## 2021-07-19 VITALS — BP 122/78 | HR 86 | Resp 20 | Ht 70.0 in | Wt 280.8 lb

## 2021-07-19 DIAGNOSIS — E669 Obesity, unspecified: Secondary | ICD-10-CM

## 2021-07-19 DIAGNOSIS — I420 Dilated cardiomyopathy: Secondary | ICD-10-CM

## 2021-07-19 DIAGNOSIS — I10 Essential (primary) hypertension: Secondary | ICD-10-CM | POA: Diagnosis not present

## 2021-07-19 DIAGNOSIS — I480 Paroxysmal atrial fibrillation: Secondary | ICD-10-CM | POA: Diagnosis not present

## 2021-07-19 NOTE — Patient Instructions (Signed)
°  Testing/Procedures: ° °Your physician has requested that you have an echocardiogram. Echocardiography is a painless test that uses sound waves to create images of your heart. It provides your doctor with information about the size and shape of your heart and how well your heart’s chambers and valves are working. This procedure takes approximately one hour. There are no restrictions for this procedure. [address] ° ° °Follow-Up: °At [facility], you and your health needs are our priority.  As part of our continuing mission to provide you with exceptional heart care, we have created designated Provider Care Teams.  These Care Teams include your primary Cardiologist (physician) and Advanced Practice Providers (APPs -  Physician Assistants and Nurse Practitioners) who all work together to provide you with the care you need, when you need it. ° °We recommend signing up for the patient portal called "MyChart".  Sign up information is provided on this After Visit Summary.  MyChart is used to connect with patients for Virtual Visits (Telemedicine).  Patients are able to view lab/test results, encounter notes, upcoming appointments, etc.  Non-urgent messages can be sent to your provider as well.   °To learn more about what you can do with MyChart, go to [email or web address]://[email or web address].com.   ° °Your next appointment:   °12 month(s) ° °The format for your next appointment:   °In Person ° °Provider:   °Antiono Hochrein, MD   ° ° ° °

## 2021-07-28 ENCOUNTER — Other Ambulatory Visit: Payer: Self-pay | Admitting: Cardiology

## 2021-07-28 NOTE — Telephone Encounter (Signed)
Xarelto 20 mg refill request received. Pt is 65 years old, weight- 127.4 kg, Crea- 1.17 on 06/12/21 , last seen by Dr. Antoine Poche on 07/19/21, Diagnosis-PAF, CrCl- 116.45; Dose is appropriate based on dosing criteria. Will send in refill to requested pharmacy.

## 2021-08-21 ENCOUNTER — Ambulatory Visit (HOSPITAL_COMMUNITY): Payer: BC Managed Care – PPO | Attending: Internal Medicine

## 2021-08-21 ENCOUNTER — Other Ambulatory Visit: Payer: Self-pay

## 2021-08-21 DIAGNOSIS — I420 Dilated cardiomyopathy: Secondary | ICD-10-CM | POA: Insufficient documentation

## 2021-08-21 LAB — ECHOCARDIOGRAM COMPLETE
Area-P 1/2: 5.88 cm2
S' Lateral: 3.4 cm

## 2021-08-31 ENCOUNTER — Other Ambulatory Visit: Payer: Self-pay | Admitting: Cardiology

## 2021-08-31 NOTE — Telephone Encounter (Signed)
Prescription refill request for Xarelto received.  Indication:Afib Last office visit:12/22 Weight:127.4 kg Age:66 Scr:1.1 CrCl:123.86 ml/min  Prescription refilled

## 2021-11-03 ENCOUNTER — Other Ambulatory Visit: Payer: Self-pay | Admitting: Cardiology

## 2021-12-08 ENCOUNTER — Other Ambulatory Visit: Payer: Self-pay | Admitting: Cardiology

## 2022-02-08 ENCOUNTER — Other Ambulatory Visit: Payer: Self-pay | Admitting: Cardiology

## 2022-03-19 ENCOUNTER — Telehealth: Payer: Self-pay | Admitting: Cardiology

## 2022-03-19 NOTE — Telephone Encounter (Signed)
Pt is calling to get a new prescription for a CPAP machine sent to [facility]. States his current CPAP machine is 64 years old and not working the same.

## 2022-03-26 NOTE — Telephone Encounter (Signed)
Pt calling back for an update  

## 2022-03-27 NOTE — Telephone Encounter (Signed)
Patient is wanting to get a new order for sleep machine. Patient stated the old one is not working and [facility] just needed a new prescription sent to them. Patient asked about this a while back and has not heard back. Will send to Dr. Mayford Knife and her nurse.

## 2022-03-29 NOTE — Telephone Encounter (Signed)
Patient has an appointment scheduled for 05/09/22.

## 2022-04-02 ENCOUNTER — Other Ambulatory Visit: Payer: Self-pay | Admitting: Cardiology

## 2022-04-03 NOTE — Telephone Encounter (Signed)
Prescription refill request for Xarelto received.  Indication:Afib Last office visit:12/22 Weight:127.4 kg Age:66 Scr:1.1 CrCl:122.25 ml/min  Prescription refilled

## 2022-05-09 ENCOUNTER — Encounter: Payer: Self-pay | Admitting: Cardiology

## 2022-05-09 ENCOUNTER — Ambulatory Visit: Payer: BC Managed Care – PPO | Attending: Cardiology | Admitting: Cardiology

## 2022-05-09 ENCOUNTER — Telehealth: Payer: Self-pay | Admitting: *Deleted

## 2022-05-09 VITALS — BP 120/66 | HR 71 | Ht 70.5 in | Wt 273.0 lb

## 2022-05-09 DIAGNOSIS — I10 Essential (primary) hypertension: Secondary | ICD-10-CM

## 2022-05-09 DIAGNOSIS — G4733 Obstructive sleep apnea (adult) (pediatric): Secondary | ICD-10-CM

## 2022-05-09 NOTE — Telephone Encounter (Signed)
-----   Message from Antonieta Iba, [place] sent at 05/09/2022 12:02 PM EDT ----- Per Dr. Radford Pax: order a new ResMed CPAP at 18cm H2O with heated humidity and mask of choice -follow with me in 6 weeks after he gets his new device Thanks! Carly

## 2022-05-09 NOTE — Progress Notes (Addendum)
Sleep Medicine CONSULT Note    Date:  05/09/2022   ID:  Willette Pa, DOB [birth year], MRN [id number]  PCP:  Patient, No Pcp Per  Cardiologist: Minus Breeding, MD   Chief Complaint  Patient presents with   New Patient (Initial Visit)    Obstructive sleep apnea    History of Present Illness:  James [name] is a 66 y.o. male who is being seen today for the evaluation of obstructive sleep apnea at the request of Minus Breeding, MD.  This is a 66 year old male with a history of remote tobacco use, asthma, hypertension, morbid obesity, nonischemic cardiomyopathy and proximal atrial fibrillation.  He was seen by me over 6 years ago for obstructive sleep apnea.  At that time he had severe obstructive sleep apnea with an AHI of 49/h with moderate oxygen desaturations as low as 84%.  He was on CPAP at 16 cm H2O but was lost to follow-up.  He has now been referred back to establish sleep care.  He is doing well with his PAP device and thinks that he has gotten used to it.  He tolerates the FF mask and feels the pressure is adequate.  Since going on PAP he feels rested in the am and has no significant daytime sleepiness if he gets enough sleep the night before.  He denies any significant  nasal congestion.  He does have problems with dry mouth and dry nose but runs out of water in his chamber.  .He does not think that he snores.  He tells me that his device is old and he needs a new device.   Past Medical History:  Diagnosis Date   Asthma    Former tobacco use    Hypertension    Morbid obesity ([place])    New onset atrial fibrillation ([place]) 07/2015   Non-ischemic cardiomyopathy ([place])    Suspected sleep apnea     Past Surgical History:  Procedure Laterality Date   CARDIOVERSION N/A 08/08/2015   Procedure: CARDIOVERSION;  Surgeon: Dorothy Spark, MD;  Location: [place];  Service: Cardiovascular;  Laterality: N/A;   CARDIOVERSION N/A 08/12/2015   Procedure: CARDIOVERSION;  Surgeon:  Skeet Latch, MD;  Location: [place];  Service: Cardiovascular;  Laterality: N/A;   MOUTH SURGERY     TEE WITHOUT CARDIOVERSION N/A 08/08/2015   Procedure: TRANSESOPHAGEAL ECHOCARDIOGRAM (TEE);  Surgeon: Dorothy Spark, MD;  Location: [facility] ENDOSCOPY;  Service: Cardiovascular;  Laterality: N/A;    Current Medications: Current Meds  Medication Sig   ascorbic acid (VITAMIN C) 500 MG tablet Take 500 mg by mouth daily.   atorvastatin (LIPITOR) 40 MG tablet Take 1 tablet (40 mg total) by mouth daily at 6 PM.   carvedilol (COREG) 25 MG tablet TAKE 2 TABLETS BY MOUTH IN THE MORNING AND TAKE 1 TABLET BY MOUTH IN THE AFTERNOON   losartan (COZAAR) 50 MG tablet TAKE 1 TABLET BY MOUTH IN THE MORNING AND  ONE-HALF  TABLET  IN  THE  AFTERNOON   MAGNESIUM PO Take 1 tablet by mouth daily.   spironolactone (ALDACTONE) 25 MG tablet Take 1 tablet by mouth once daily   XARELTO 20 MG TABS tablet TAKE 1 TABLET BY MOUTH DAILY WITH SUPPER    Allergies:   Patient has no known allergies.   Social History   Socioeconomic History   Marital status: Single    Spouse name: Not on file   Number of children: Not on file   Years of education:  Not on file   Highest education level: Not on file  Occupational History   Occupation: Furniture conservator/restorer  Tobacco Use   Smoking status: Former   Smokeless tobacco: Never   Tobacco comments:    Smoked for 20 years, quit ~1995  Vaping Use   Vaping Use: Never used  Substance and Sexual Activity   Alcohol use: No    Alcohol/week: 0.0 standard drinks of alcohol   Drug use: No   Sexual activity: Not on file  Other Topics Concern   Not on file  Social History Narrative   Not on file   Social Determinants of Health   Financial Resource Strain: Not on file  Food Insecurity: Not on file  Transportation Needs: Not on file  Physical Activity: Not on file  Stress: Not on file  Social Connections: Not on file     Family History:  The patient's family history includes  CAD in his maternal aunt and mother.   ROS:   Please see the history of present illness.    ROS All other systems reviewed and are negative.      No data to display             PHYSICAL EXAM:   VS:  BP 120/66   Pulse 71   Ht 5' 10.5" (1.791 m)   Wt 273 lb (123.8 kg)   SpO2 97%   BMI 38.62 kg/m    GEN: Well nourished, well developed, in no acute distress  HEENT: normal  Neck: no JVD, carotid bruits, or masses Cardiac: irregularly irregular; no murmurs, rubs, or gallops,no edema.  Intact distal pulses bilaterally.  Respiratory:  clear to auscultation bilaterally, normal work of breathing GI: soft, nontender, nondistended, + BS MS: no deformity or atrophy  Skin: warm and dry, no rash Neuro:  Alert and Oriented x 3, Strength and sensation are intact Psych: euthymic mood, full affect  Wt Readings from Last 3 Encounters:  05/09/22 273 lb (123.8 kg)  07/19/21 280 lb 12.8 oz (127.4 kg)  06/12/19 278 lb 12.8 oz (126.5 kg)      Studies/Labs Reviewed:   CPAP compliance download  Recent Labs: 06/12/2021: ALT 27; BUN 17; Creatinine, Ser 1.17; Hemoglobin 16.5; Platelets 172; Potassium 4.5; Sodium 138    CHA2DS2-VASc Score =     This indicates a  % annual risk of stroke. The patient's score is based upon:             Additional studies/ records that were reviewed today include:  Office notes from Dr. Percival Spanish    ASSESSMENT:    1. OSA (obstructive sleep apnea)   2. Essential hypertension      PLAN:  In order of problems listed above:  OSA - The patient is tolerating PAP therapy well without any problems. The PAP download performed by his DME was personally reviewed and interpreted by me today and showed an AHI of 0.7/hr on 18 cm H2O with 100% compliance in using more than 4 hours nightly.  The patient has been using and benefiting from PAP use and will continue to benefit from therapy.  -I have encouraged him to increase the humidity in his device and use a  humidifier in his room so he does not run out of water -I will order a new ResMed CPAP at 18cm H2O with heated humidity and mask of choice -follow with me in 6 weeks after he gets his new device  2.  Hypertension -BP controlled on exam -Continue  prescription drug management with carvedilol 25 mg 2 tablets in the morning 1 tablet in the evening, spironolactone 25 mg daily and losartan 50 mg daily with as needed refills   Time Spent: 20 minutes total time of encounter, including 15 minutes spent in face-to-face patient care on the date of this encounter. This time includes coordination of care and counseling regarding above mentioned problem list. Remainder of non-face-to-face time involved reviewing chart documents/testing relevant to the patient encounter and documentation in the medical record. I have independently reviewed documentation from referring provider  Medication Adjustments/Labs and Tests Ordered: Current medicines are reviewed at length with the patient today.  Concerns regarding medicines are outlined above.  Medication changes, Labs and Tests ordered today are listed in the Patient Instructions below.  There are no Patient Instructions on file for this visit.   Signed, Fransico Him, MD  05/09/2022 11:45 AM    [facility] [place], [place], [place]  [address] Phone: [phone]; Fax: [phone]

## 2022-05-09 NOTE — Patient Instructions (Signed)
Medication Instructions:  Your physician recommends that you continue on your current medications as directed. Please refer to the Current Medication list given to you today.  *If you need a refill on your cardiac medications before your next appointment, please call your pharmacy*  Follow-Up: At [facility], you and your health needs are our priority.  As part of our continuing mission to provide you with exceptional heart care, we have created designated Provider Care Teams.  These Care Teams include your primary Cardiologist (physician) and Advanced Practice Providers (APPs -  Physician Assistants and Nurse Practitioners) who all work together to provide you with the care you need, when you need it.  Your next appointment:   Follow up after receiving your new device  Important Information About Sugar

## 2022-05-09 NOTE — Telephone Encounter (Signed)
Order placed in parachute to [facility] for ResMed CPAP at 18cm H2O with heated humidity and mask of choice.

## 2022-05-11 ENCOUNTER — Other Ambulatory Visit: Payer: Self-pay

## 2022-05-11 MED ORDER — CARVEDILOL 25 MG PO TABS: ORAL_TABLET | ORAL | 3 refills | Status: DC

## 2022-06-01 ENCOUNTER — Telehealth: Payer: Self-pay | Admitting: Cardiology

## 2022-06-01 NOTE — Telephone Encounter (Signed)
*  STAT* If patient is at the pharmacy, call can be transferred to refill team.   1. Which medications need to be refilled? (please list name of each medication and dose if known) spironolactone (ALDACTONE) 25 MG tablet   losartan (COZAAR) 50 MG tablet    2. Which pharmacy/location (including street and city if local pharmacy) is medication to be sent to? [place], [place] [address]   3. Do they need a 30 day or 90 day supply? [place]

## 2022-06-04 MED ORDER — SPIRONOLACTONE 25 MG PO TABS: 25.0000 mg | ORAL_TABLET | Freq: Every day | ORAL | 3 refills | Status: DC

## 2022-06-04 MED ORDER — LOSARTAN POTASSIUM 50 MG PO TABS: ORAL_TABLET | ORAL | 3 refills | Status: DC

## 2022-07-17 NOTE — Progress Notes (Addendum)
Cardiology Office Note   Date:  07/19/2022   ID:  James [name], DOB Mar 21, 1956, MRN [id number]  PCP:  Patient, No Pcp Per  Cardiologist:   Rollene Rotunda, MD   Chief Complaint  Patient presents with   Atrial Fibrillation      History of Present Illness: James [name] is a 66 y.o. male who presents for evaluation of atrial fib.   He was in the office in 2020.  with shortness of breath.  In 2017 he had atrial fibrillation. He subsequently needed TEE guided cardioversion. However, he did not convert to sinus rhythm with this and was treated with amiodarone. His second attempt at TEE cardioversion following this was successful. He has a dilated cardiomyopathy with an EF of 20-25%. Stress testing demonstrated an inferior wall defect which may have been artifact or prior inferior infarct with scarring. However, there was no ischemia.  The EF on follow up in July 2017 was back to normal.  It was 45% on echo in 2019.  He looks now like it is chronic.  He had a normal EF in January 2023.    He thinks checking himself at home that he is in fibrillation all the time.  He does feel he thinks a little more fatigued with this.  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  He is little more tired with this.  He thinks his sleep apnea is well-controlled.  He does feel his his atrial fibrillation at times.  He does not have any presyncope or syncope.  He denies any chest pressure, neck or arm discomfort.  Has had some weight loss intentionally.  He has had no new edema.   Past Medical History:  Diagnosis Date   Asthma    Former tobacco use    Hypertension    Morbid obesity (HCC)    New onset atrial fibrillation (HCC) 07/2015   Non-ischemic cardiomyopathy (HCC)    Suspected sleep apnea     Past Surgical History:  Procedure Laterality Date   CARDIOVERSION N/A 08/08/2015   Procedure: CARDIOVERSION;  Surgeon: Lars Masson, MD;  Location: [facility] ENDOSCOPY;  Service: Cardiovascular;  Laterality: N/A;    CARDIOVERSION N/A 08/12/2015   Procedure: CARDIOVERSION;  Surgeon: Chilton Si, MD;  Location: [facility] ENDOSCOPY;  Service: Cardiovascular;  Laterality: N/A;   MOUTH SURGERY     TEE WITHOUT CARDIOVERSION N/A 08/08/2015   Procedure: TRANSESOPHAGEAL ECHOCARDIOGRAM (TEE);  Surgeon: Lars Masson, MD;  Location: [facility] ENDOSCOPY;  Service: Cardiovascular;  Laterality: N/A;     Current Outpatient Medications  Medication Sig Dispense Refill   ascorbic acid (VITAMIN C) 500 MG tablet Take 500 mg by mouth daily.     atorvastatin (LIPITOR) 40 MG tablet Take 1 tablet (40 mg total) by mouth daily at 6 PM. 90 tablet 3   carvedilol (COREG) 25 MG tablet TAKE 2 TABLETS BY MOUTH IN THE MORNING AND TAKE 1 TABLET BY MOUTH IN THE AFTERNOON 270 tablet 3   losartan (COZAAR) 50 MG tablet TAKE 1 TABLET BY MOUTH IN THE MORNING AND  ONE-HALF  TABLET  IN  THE  AFTERNOON 135 tablet 3   MAGNESIUM PO Take 1 tablet by mouth daily.     spironolactone (ALDACTONE) 25 MG tablet Take 1 tablet (25 mg total) by mouth daily. 90 tablet 3   rivaroxaban (XARELTO) 20 MG TABS tablet Take 1 tablet (20 mg total) by mouth daily with supper. 30 tablet 5   No current facility-administered medications for  this visit.    Allergies:   Patient has no known allergies.    ROS:  Please see the history of present illness.   Otherwise, review of systems are positive for none.   All other systems are reviewed and negative.    PHYSICAL EXAM: VS:  BP 122/70   Pulse 91   Ht 5' 10.5" (1.791 m)   Wt 269 lb 9.6 oz (122.3 kg)   SpO2 97%   BMI 38.14 kg/m  , BMI Body mass index is 38.14 kg/m. GENERAL:  Well appearing NECK:  No jugular venous distention, waveform within normal limits, carotid upstroke brisk and symmetric, no bruits, no thyromegaly LUNGS:  Clear to auscultation bilaterally CHEST:  Unremarkable HEART:  PMI not displaced or sustained,S1 and S2 within normal limits, no S3, no clicks, no rubs, no murmurs, irregular ABD:  Flat,  positive bowel sounds normal in frequency in pitch, no bruits, no rebound, no guarding, no midline pulsatile mass, no hepatomegaly, no splenomegaly EXT:  2 plus pulses throughout, no edema, no cyanosis no clubbing  EKG:  EKG is  ordered today. The ekg ordered today demonstrates atrial fibrillation, rate 89, axis within normal limits, intervals within normal limits, no acute ST-T wave changes.   Recent Labs: No results found for requested labs within last 365 days.    Lipid Panel    Component Value Date/Time   CHOL 188 06/12/2021 0954   TRIG 115 06/12/2021 0954   HDL 39 (L) 06/12/2021 0954   CHOLHDL 4.8 06/12/2021 0954   CHOLHDL 2.3 09/30/2015 0736   VLDL 19 09/30/2015 0736   LDLCALC 128 (H) 06/12/2021 0954      Wt Readings from Last 3 Encounters:  07/19/22 269 lb 9.6 oz (122.3 kg)  05/09/22 273 lb (123.8 kg)  07/19/21 280 lb 12.8 oz (127.4 kg)      Other studies Reviewed: Additional studies/ records that were reviewed today include: Labs. Review of the above records demonstrates:  Please see elsewhere in the note.     ASSESSMENT AND PLAN:   DILATED CARDIOMYOPATHY:    EF was normal in January.  No change in therapy.  ATRIAL FIB:    Mr. [name] has a CHA2DS2 - VASc score of 2 .  We talked today about the possibility of ablation.  He has controlled atrial fibrillation.  He is losing weight.  His atrium does not appear to be large.  I am going to refer him to EP to talk about this.  He tolerates anticoagulation we will continue this.  I will be checking labs to include basic metabolic profile and CBC today.   HTN: His blood pressure is well-controlled.  No change in therapy.   SLEEP APNEA:   He is compliant with the CPAP.  No change in therapy.  OBESITY:   I am proud of his weight loss and encouraged more the same.   Current medicines are reviewed at length with the patient today.  The patient does not have concerns regarding medicines.  The following changes have  been made:  None  Labs/ tests ordered today include:     Orders Placed This Encounter  Procedures   Basic metabolic panel   CBC w/Diff/Platelet   Ambulatory referral to Cardiac Electrophysiology   EKG 12-Lead     Disposition:   FU with me in after the EP evaluation.    Signed, Rollene Rotunda, MD  07/19/2022 10:26 AM    [facility]

## 2022-07-19 ENCOUNTER — Ambulatory Visit: Payer: BC Managed Care – PPO | Attending: Cardiology | Admitting: Cardiology

## 2022-07-19 ENCOUNTER — Other Ambulatory Visit: Payer: Self-pay

## 2022-07-19 ENCOUNTER — Encounter: Payer: Self-pay | Admitting: Cardiology

## 2022-07-19 VITALS — BP 122/70 | HR 91 | Ht 70.5 in | Wt 269.6 lb

## 2022-07-19 DIAGNOSIS — I480 Paroxysmal atrial fibrillation: Secondary | ICD-10-CM

## 2022-07-19 DIAGNOSIS — I10 Essential (primary) hypertension: Secondary | ICD-10-CM

## 2022-07-19 DIAGNOSIS — I420 Dilated cardiomyopathy: Secondary | ICD-10-CM

## 2022-07-19 DIAGNOSIS — E785 Hyperlipidemia, unspecified: Secondary | ICD-10-CM

## 2022-07-19 MED ORDER — ATORVASTATIN CALCIUM 40 MG PO TABS: 40.0000 mg | ORAL_TABLET | Freq: Every day | ORAL | 3 refills | Status: DC

## 2022-07-19 MED ORDER — SPIRONOLACTONE 25 MG PO TABS: 25.0000 mg | ORAL_TABLET | Freq: Every day | ORAL | 3 refills | Status: DC

## 2022-07-19 MED ORDER — LOSARTAN POTASSIUM 50 MG PO TABS: ORAL_TABLET | ORAL | 3 refills | Status: AC

## 2022-07-19 MED ORDER — CARVEDILOL 25 MG PO TABS: ORAL_TABLET | ORAL | 3 refills | Status: DC

## 2022-07-19 MED ORDER — RIVAROXABAN 20 MG PO TABS: 20.0000 mg | ORAL_TABLET | Freq: Every day | ORAL | 5 refills | Status: DC

## 2022-07-19 NOTE — Telephone Encounter (Addendum)
Xarelto 20mg  refill request received. Pt is 66 years old, weight-122.3kg, Crea-1.17 on 06/12/2021-NEEDS UPDATED LABS, last seen by Dr. 06/14/2021 today on 07/19/2022, Diagnosis-Afib, CrCl- 110.34 mL/min; Dose is appropriate based on dosing criteria.   Pt in the office at this time seeing Provider and labs (CBC, BMET) are being ordered. Will send in supply of xarelto at this time and follow up on labs.   Labs returned that was drawn on 07/19/22 Crea-1.21; refill was sent yesterday.

## 2022-07-19 NOTE — Patient Instructions (Signed)
Medication Instructions:  Continue same medications *If you need a refill on your cardiac medications before your next appointment, please call your pharmacy*   Lab Work: Bmet,cbc today   Testing/Procedures: None ordered   Follow-Up: At [facility], you and your health needs are our priority.  As part of our continuing mission to provide you with exceptional heart care, we have created designated Provider Care Teams.  These Care Teams include your primary Cardiologist (physician) and Advanced Practice Providers (APPs -  Physician Assistants and Nurse Practitioners) who all work together to provide you with the care you need, when you need it.  We recommend signing up for the patient portal called "MyChart".  Sign up information is provided on this After Visit Summary.  MyChart is used to connect with patients for Virtual Visits (Telemedicine).  Patients are able to view lab/test results, encounter notes, upcoming appointments, etc.  Non-urgent messages can be sent to your provider as well.   To learn more about what you can do with MyChart, go to [email or web address].    Your next appointment:  1 year   Call in Sept to schedule Dec appointment     The format for your next appointment: Office     Provider:  Dr.Hochrein   Schedule appointment with EP for possible ablation   Important Information About Sugar

## 2022-07-20 LAB — CBC WITH DIFFERENTIAL/PLATELET
Basophils Absolute: 0.1 10*3/uL (ref 0.0–0.2)
Basos: 1 %
EOS (ABSOLUTE): 0.3 10*3/uL (ref 0.0–0.4)
Eos: 5 %
Hematocrit: 47 % (ref 37.5–51.0)
Hemoglobin: 16.1 g/dL (ref 13.0–17.7)
Immature Grans (Abs): 0 10*3/uL (ref 0.0–0.1)
Immature Granulocytes: 0 %
Lymphocytes Absolute: 1.6 10*3/uL (ref 0.7–3.1)
Lymphs: 26 %
MCH: 31 pg (ref 26.6–33.0)
MCHC: 34.3 g/dL (ref 31.5–35.7)
MCV: 91 fL (ref 79–97)
Monocytes Absolute: 0.5 10*3/uL (ref 0.1–0.9)
Monocytes: 8 %
Neutrophils Absolute: 3.7 10*3/uL (ref 1.4–7.0)
Neutrophils: 60 %
Platelets: 193 10*3/uL (ref 150–450)
RBC: 5.19 x10E6/uL (ref 4.14–5.80)
RDW: 12.7 % (ref 11.6–15.4)
WBC: 6.3 10*3/uL (ref 3.4–10.8)

## 2022-07-20 LAB — BASIC METABOLIC PANEL
BUN/Creatinine Ratio: 14 (ref 10–24)
BUN: 17 mg/dL (ref 8–27)
CO2: 24 mmol/L (ref 20–29)
Calcium: 9.6 mg/dL (ref 8.6–10.2)
Chloride: 99 mmol/L (ref 96–106)
Creatinine, Ser: 1.21 mg/dL (ref 0.76–1.27)
Glucose: 218 mg/dL — ABNORMAL HIGH (ref 70–99)
Potassium: 4.4 mmol/L (ref 3.5–5.2)
Sodium: 138 mmol/L (ref 134–144)
eGFR: 67 mL/min/{1.73_m2} (ref >59)

## 2022-07-26 ENCOUNTER — Telehealth: Payer: Self-pay | Admitting: Cardiology

## 2022-07-26 DIAGNOSIS — I480 Paroxysmal atrial fibrillation: Secondary | ICD-10-CM

## 2022-07-26 MED ORDER — RIVAROXABAN 20 MG PO TABS: 20.0000 mg | ORAL_TABLET | Freq: Every day | ORAL | 1 refills | Status: DC

## 2022-07-26 NOTE — Telephone Encounter (Signed)
*  STAT* If patient is at the pharmacy, call can be transferred to refill team.   1. Which medications need to be refilled? (please list name of each medication and dose if known)   rivaroxaban (XARELTO) 20 MG TABS tablet   2. Which pharmacy/location (including street and city if local pharmacy) is medication to be sent to?  [id number] - [place], [place] - [address] AT [facility] OF [address]    3. Do they need a 30 day or 90 day supply?  90 day   Patient stated he is moving his prescription to [facility].  Patient stated he still has some medication left.

## 2022-07-26 NOTE — Telephone Encounter (Signed)
Xarelto 20mg  refill request received. Pt is 66 years old, weight-122.3kg, Crea- 1.21 on 07/19/2022, last seen by Dr. 07/21/2022 on 07/19/2022, Diagnosis-Afib, CrCl- 106.69 mL/min; Dose is appropriate based on dosing criteria. Will send in refill to requested pharmacy.      Refill sent on 12/21 to [facility] and this request is being sent to requested pharmacy per pt for 90 day supply.

## 2022-07-31 ENCOUNTER — Encounter: Payer: Self-pay | Admitting: *Deleted

## 2022-08-21 ENCOUNTER — Institutional Professional Consult (permissible substitution): Payer: BC Managed Care – PPO | Admitting: Cardiology

## 2022-09-14 ENCOUNTER — Encounter: Payer: Self-pay | Admitting: Cardiology

## 2022-09-14 ENCOUNTER — Ambulatory Visit: Payer: BC Managed Care – PPO | Attending: Cardiology | Admitting: Cardiology

## 2022-09-14 VITALS — BP 120/84 | HR 80 | Ht 70.5 in | Wt 274.0 lb

## 2022-09-14 DIAGNOSIS — I4811 Longstanding persistent atrial fibrillation: Secondary | ICD-10-CM | POA: Diagnosis not present

## 2022-09-14 DIAGNOSIS — D6869 Other thrombophilia: Secondary | ICD-10-CM | POA: Diagnosis not present

## 2022-09-14 MED ORDER — AMIODARONE HCL 200 MG PO TABS: ORAL_TABLET | ORAL | 2 refills | Status: DC

## 2022-09-14 NOTE — Patient Instructions (Signed)
Medication Instructions:  Your physician has recommended you make the following change in your medication:  START Amiodarone  - take 2 tablets (400 mg total) TWICE a day for 2 weeks, then  - take 1 tablet (200 mg total) TWICE a day for 2 weeks, then  - take 1 tablet (200 mg total) ONCE a day   *If you need a refill on your cardiac medications before your next appointment, please call your pharmacy*   Lab Work: None ordered If you have labs (blood work) drawn today and your tests are completely normal, you will receive your results only by: MyChart Message (if you have MyChart) OR A paper copy in the mail If you have any lab test that is abnormal or we need to change your treatment, we will call you to review the results.   Testing/Procedures: Your physician has recommended that you have a Cardioversion (DCCV). Electrical Cardioversion uses a jolt of electricity to your heart either through paddles or wired patches attached to your chest. This is a controlled, usually prescheduled, procedure. Defibrillation is done under light anesthesia in the hospital, and you usually go home the day of the procedure. This is done to get your heart back into a normal rhythm. You are not awake for the procedure.   The nurse will send you instructions via mychart.  This procedure is scheduled for 3/5    Follow-Up: At [facility], you and your health needs are our priority.  As part of our continuing mission to provide you with exceptional heart care, we have created designated Provider Care Teams.  These Care Teams include your primary Cardiologist (physician) and Advanced Practice Providers (APPs -  Physician Assistants and Nurse Practitioners) who all work together to provide you with the care you need, when you need it.   Your physician recommends that you schedule a follow-up appointment in the AFib clinic: 2 weeks after your cardioversion on 3/5  Your next appointment:   6 month(s)  The  format for your next appointment:   In Person  Provider:   Allegra Lai, MD    Thank you for choosing [facility]!!   Trinidad Curet, RN [phone]  Other Instructions

## 2022-09-14 NOTE — Progress Notes (Signed)
Electrophysiology Office Note   Date:  09/14/2022   ID:  James [name], DOB 1956/02/09, MRN [id number]  PCP:  Patient, No Pcp Per  Cardiologist:  Hochrein Primary Electrophysiologist: Caelan Atchley Meredith Leeds, MD    Chief Complaint: AF   History of Present Illness: James [name] is a 67 y.o. male who is being seen today for the evaluation of AF at the request of Minus Breeding, MD. Presenting today for electrophysiology evaluation.  Significant for hypertension, morbid obesity, atrial fibrillation, nonischemic cardiomyopathy.  He had atrial fibrillation diagnosed in 2017.  He had TEE cardioversion at that time.  He did not convert to sinus rhythm and was loaded on amiodarone.  He did have a successful second attempt at cardioversion.  He was found to have a dilated cardiomyopathy with ejection fraction 20 to 25%.  He had a stress test that showed an inferior infarct with scarring and no ischemia.  Follow-up echo showed an ejection fraction of 45% in 2019.  January 2023 as ejection fraction was normal.  He feels that he has been in atrial fibrillation all the time.  He has been more fatigued.  He has to walk quite a bit at work and has felt more tired doing this.  He thinks he potentially has sleep apnea.  Today, he denies symptoms of palpitations, chest pain, shortness of breath, orthopnea, PND, lower extremity edema, claudication, dizziness, presyncope, syncope, bleeding, or neurologic sequela. The patient is tolerating medications without difficulties.    Past Medical History:  Diagnosis Date   Asthma    Former tobacco use    Hypertension    Morbid obesity ([place])    New onset atrial fibrillation ([place]) 07/2015   Non-ischemic cardiomyopathy ([place])    Suspected sleep apnea    Past Surgical History:  Procedure Laterality Date   CARDIOVERSION N/A 08/08/2015   Procedure: CARDIOVERSION;  Surgeon: Dorothy Spark, MD;  Location: [place];  Service: Cardiovascular;  Laterality: N/A;    CARDIOVERSION N/A 08/12/2015   Procedure: CARDIOVERSION;  Surgeon: Skeet Latch, MD;  Location: [place];  Service: Cardiovascular;  Laterality: N/A;   MOUTH SURGERY     TEE WITHOUT CARDIOVERSION N/A 08/08/2015   Procedure: TRANSESOPHAGEAL ECHOCARDIOGRAM (TEE);  Surgeon: Dorothy Spark, MD;  Location: [facility] ENDOSCOPY;  Service: Cardiovascular;  Laterality: N/A;     Current Outpatient Medications  Medication Sig Dispense Refill   ascorbic acid (VITAMIN C) 500 MG tablet Take 500 mg by mouth daily.     carvedilol (COREG) 25 MG tablet TAKE 2 TABLETS BY MOUTH IN THE MORNING AND TAKE 1 TABLET BY MOUTH IN THE AFTERNOON 270 tablet 3   losartan (COZAAR) 50 MG tablet TAKE 1 TABLET BY MOUTH IN THE MORNING AND  ONE-HALF  TABLET  IN  THE  AFTERNOON 135 tablet 3   MAGNESIUM PO Take 1 tablet by mouth daily.     rivaroxaban (XARELTO) 20 MG TABS tablet Take 1 tablet (20 mg total) by mouth daily with supper. 90 tablet 1   spironolactone (ALDACTONE) 25 MG tablet Take 1 tablet (25 mg total) by mouth daily. 90 tablet 3   atorvastatin (LIPITOR) 40 MG tablet Take 1 tablet (40 mg total) by mouth daily at 6 PM. (Patient not taking: Reported on 09/14/2022) 90 tablet 3   No current facility-administered medications for this visit.    Allergies:   Patient has no known allergies.   Social History:  The patient  reports that he has quit smoking. He has never used smokeless  tobacco. He reports that he does not drink alcohol and does not use drugs.   Family History:  The patient's family history includes CAD in his maternal aunt and mother.    ROS:  Please see the history of present illness.   Otherwise, review of systems is positive for none.   All other systems are reviewed and negative.    PHYSICAL EXAM: VS:  BP 120/84   Pulse 80   Ht 5' 10.5" (1.791 m)   Wt 274 lb (124.3 kg)   SpO2 97%   BMI 38.76 kg/m  , BMI Body mass index is 38.76 kg/m. GEN: Well nourished, well developed, in no acute distress   HEENT: normal  Neck: no JVD, carotid bruits, or masses Cardiac: irregular; no murmurs, rubs, or gallops,no edema  Respiratory:  clear to auscultation bilaterally, normal work of breathing GI: soft, nontender, nondistended, + BS MS: no deformity or atrophy  Skin: warm and dry Neuro:  Strength and sensation are intact Psych: euthymic mood, full affect  EKG:  EKG is not ordered today. Personal review of the ekg ordered 07/19/22 shows atrial fibrillation  Recent Labs: 07/19/2022: BUN 17; Creatinine, Ser 1.21; Hemoglobin 16.1; Platelets 193; Potassium 4.4; Sodium 138    Lipid Panel     Component Value Date/Time   CHOL 188 06/12/2021 0954   TRIG 115 06/12/2021 0954   HDL 39 (L) 06/12/2021 0954   CHOLHDL 4.8 06/12/2021 0954   CHOLHDL 2.3 09/30/2015 0736   VLDL 19 09/30/2015 0736   LDLCALC 128 (H) 06/12/2021 0954     Wt Readings from Last 3 Encounters:  09/14/22 274 lb (124.3 kg)  07/19/22 269 lb 9.6 oz (122.3 kg)  05/09/22 273 lb (123.8 kg)      Other studies Reviewed: Additional studies/ records that were reviewed today include: TTE 08/21/21  Review of the above records today demonstrates:   1. Left ventricular ejection fraction, by estimation, is 55 to 60%. The  left ventricle has normal function. The left ventricle has no regional  wall motion abnormalities. Left ventricular diastolic parameters are  indeterminate.   2. Right ventricular systolic function is normal. The right ventricular  size is normal. There is normal pulmonary artery systolic pressure.   3. The mitral valve is normal in structure. Mild mitral valve  regurgitation.   4. The aortic valve is tricuspid. Aortic valve regurgitation is not  visualized. Aortic valve sclerosis is present, with no evidence of aortic  valve stenosis.   5. The inferior vena cava is dilated in size with <50% respiratory  variability, suggesting right atrial pressure of 15 mmHg.    ASSESSMENT AND PLAN:  1.  Longstanding  persistent atrial fibrillation: CHA2DS2-VASc of 2.  Currently on carvedilol, Xarelto doses above.  He feels fatigued and atrial fibrillation.  He is potentially interested in hybrid atrial fibrillation ablation, but he would like to try and lose some weight over the next 6 months.  As he is feeling fatigued, Gloriana Piltz load amiodarone and attempt cardioversion.  If he does not convert, this may indicate that he is in permanent atrial fibrillation.  This was discussed with his primary cardiologist.  2.  Secondary hypercoagulable state: Currently on Xarelto as above  3.  Hypertension: Currently well-controlled  4.  Obstructive sleep apnea: CPAP compliance encouraged  5.  Obesity: Lifestyle modification encouraged Body mass index is 38.76 kg/m.  6.  Dilated cardiomyopathy: Ejection fraction since normalized.  Current medicines are reviewed at length with the patient today.  The patient does not have concerns regarding his medicines.  The following changes were made today:  amiodarone  Labs/ tests ordered today include:  No orders of the defined types were placed in this encounter.    Disposition:   FU with Margreat Widener 6 months  Signed, Tamiyah Moulin Meredith Leeds, MD  09/14/2022 12:21 PM     [address] [place] [address] [phone] (office) [phone] (fax)

## 2022-09-26 ENCOUNTER — Telehealth: Payer: Self-pay | Admitting: Cardiology

## 2022-09-26 NOTE — Telephone Encounter (Signed)
Patient stated he would like to cancel cardioversion that is scheduled for 10/02/22 because with his insurance he has to pay 20% and the anesthesiologist is not covered. He states he just can not afford the procedure at this time and being that he has been on A-Fib for seven years the procedure may not work.  He then states he will be eligible for medicare and medicaid at the end of August and he can reschedule.

## 2022-09-26 NOTE — Telephone Encounter (Signed)
Pt would like a callback regarding cancelling upcoming procedure on 10/02/22. Please advise

## 2022-09-26 NOTE — Telephone Encounter (Signed)
DCCV cancelled. Will further discuss tx plan w/ MD

## 2022-10-02 ENCOUNTER — Encounter (HOSPITAL_COMMUNITY): Admission: RE | Payer: Self-pay | Source: Ambulatory Visit

## 2022-10-02 ENCOUNTER — Ambulatory Visit (HOSPITAL_COMMUNITY): Admission: RE | Admit: 2022-10-02 | Payer: BC Managed Care – PPO | Source: Ambulatory Visit | Admitting: Cardiology

## 2022-10-02 DIAGNOSIS — I4819 Other persistent atrial fibrillation: Secondary | ICD-10-CM

## 2022-10-02 SURGERY — CARDIOVERSION
Anesthesia: Monitor Anesthesia Care

## 2022-10-04 NOTE — Telephone Encounter (Signed)
Reviewed w/ MD. Pt advised to keep follow up in the afib clinic next month & [place] in August. Pt agreeable to plan. He does state if things change before then he will let us know. He appreciates my follow up phone call.

## 2022-10-23 ENCOUNTER — Ambulatory Visit (HOSPITAL_COMMUNITY)
Admission: RE | Admit: 2022-10-23 | Discharge: 2022-10-23 | Disposition: A | Discharge status: Home / Self Care | Payer: BC Managed Care – PPO | Source: Ambulatory Visit | Attending: Physician Assistant | Admitting: Physician Assistant

## 2022-10-23 VITALS — BP 156/96 | HR 75 | Ht 70.5 in | Wt 273.6 lb

## 2022-10-23 DIAGNOSIS — Z7182 Exercise counseling: Secondary | ICD-10-CM | POA: Insufficient documentation

## 2022-10-23 DIAGNOSIS — G4733 Obstructive sleep apnea (adult) (pediatric): Secondary | ICD-10-CM | POA: Insufficient documentation

## 2022-10-23 DIAGNOSIS — Z7901 Long term (current) use of anticoagulants: Secondary | ICD-10-CM | POA: Diagnosis not present

## 2022-10-23 DIAGNOSIS — Z6838 Body mass index (BMI) 38.0-38.9, adult: Secondary | ICD-10-CM | POA: Diagnosis not present

## 2022-10-23 DIAGNOSIS — I10 Essential (primary) hypertension: Secondary | ICD-10-CM | POA: Insufficient documentation

## 2022-10-23 DIAGNOSIS — I4811 Longstanding persistent atrial fibrillation: Secondary | ICD-10-CM

## 2022-10-23 DIAGNOSIS — D6869 Other thrombophilia: Secondary | ICD-10-CM

## 2022-10-23 DIAGNOSIS — I4892 Unspecified atrial flutter: Secondary | ICD-10-CM | POA: Insufficient documentation

## 2022-10-23 DIAGNOSIS — Z79899 Other long term (current) drug therapy: Secondary | ICD-10-CM | POA: Insufficient documentation

## 2022-10-23 MED ORDER — AMIODARONE HCL 200 MG PO TABS: ORAL_TABLET | ORAL | Status: DC

## 2022-10-23 NOTE — Progress Notes (Signed)
Primary Care Physician: Patient, No Pcp Per Primary Cardiologist: Dr. Percival Spanish Primary Electrophysiologist: Dr. Curt Bears Referring Physician: N/A   James [name] is a 67 y.o. male with a history of tobacco use, hypertension, asthma, morbid obesity, nonischemic cardiomyopathy, obstructive sleep apnea, and longstanding persistent atrial fibrillation who presents for consultation in the [facility].  The patient was initially diagnosed with atrial fibrillation in 2017. He had TEE cardioversion at that time. He did not convert to sinus rhythm and was loaded on amiodarone. He did have a successful second attempt at cardioversion. He was found to have a dilated cardiomyopathy with ejection fraction 20 to 25%. He had a stress test that showed an inferior infarct with scarring and no ischemia. Follow-up echo showed an ejection fraction of 45% in 2019. January 2023 as ejection fraction was normal. Patient is on Xarelto for a CHADS2VASC score of 2.  On evaluation today, he was seen by Dr. Curt Bears on 09/14/22. He admitted to feeling fatigued and occasionally short of breath with all the walking he has to do at work. He was loaded on amiodarone and plan to perform DCCV. He canceled DCCV due to cost. His plan is to wait until August when he turns 25 and has Medicare. He still feels like he has been in atrial fibrillation all the time. He does admit to still feeling fatigued with the quantity of walking he has to do at his work. No chest pain. He has been compliant with amiodarone BID. He has been taking Xarelto daily and has no bleeding concerns. He is compliant with CPAP. He admits to only taking losartan in the morning and did not know he was supposed to take a half tablet in the afternoon.  Today, he denies symptoms of palpitations, chest pain, orthopnea, PND, lower extremity edema, dizziness, presyncope, syncope, snoring, daytime somnolence, bleeding, or neurologic sequela. The patient  is tolerating medications without difficulties and is otherwise without complaint today.    Atrial Fibrillation Risk Factors:  he does have symptoms or diagnosis of sleep apnea. he is compliant with CPAP therapy. he does not have a history of rheumatic fever. he does not have a history of alcohol use. The patient does not have a history of early familial atrial fibrillation or other arrhythmias.  he has a BMI of Body mass index is 38.7 kg/m.Marland Kitchen Filed Weights   10/23/22 0847  Weight: 124.1 kg    Family History  Problem Relation Age of Onset   CAD Mother        CABG age 32   CAD Maternal Aunt        CABG age 30     Atrial Fibrillation Management history:  Previous antiarrhythmic drugs: amiodarone Previous cardioversions: 2017 x 2 Previous ablations: None CHADS2VASC score: 2 Anticoagulation history: Xarelto   Past Medical History:  Diagnosis Date   Asthma    Former tobacco use    Hypertension    Morbid obesity ([place])    New onset atrial fibrillation ([facility]) 07/2015   Non-ischemic cardiomyopathy ([facility])    Suspected sleep apnea    Past Surgical History:  Procedure Laterality Date   CARDIOVERSION N/A 08/08/2015   Procedure: CARDIOVERSION;  Surgeon: Dorothy Spark, MD;  Location: [facility];  Service: Cardiovascular;  Laterality: N/A;   CARDIOVERSION N/A 08/12/2015   Procedure: CARDIOVERSION;  Surgeon: Skeet Latch, MD;  Location: [facility];  Service: Cardiovascular;  Laterality: N/A;   MOUTH SURGERY     TEE WITHOUT CARDIOVERSION N/A  08/08/2015   Procedure: TRANSESOPHAGEAL ECHOCARDIOGRAM (TEE);  Surgeon: Dorothy Spark, MD;  Location: [facility] ENDOSCOPY;  Service: Cardiovascular;  Laterality: N/A;    Current Outpatient Medications  Medication Sig Dispense Refill   carvedilol (COREG) 25 MG tablet TAKE 2 TABLETS BY MOUTH IN THE MORNING AND TAKE 1 TABLET BY MOUTH IN THE AFTERNOON 270 tablet 3   losartan (COZAAR) 50 MG tablet TAKE 1 TABLET BY MOUTH IN THE MORNING AND   ONE-HALF  TABLET  IN  THE  AFTERNOON 135 tablet 3   MAGNESIUM PO Take 1 tablet by mouth 4 (four) times a week.     rivaroxaban (XARELTO) 20 MG TABS tablet Take 1 tablet (20 mg total) by mouth daily with supper. 90 tablet 1   spironolactone (ALDACTONE) 25 MG tablet Take 1 tablet (25 mg total) by mouth daily. 90 tablet 3   amiodarone (PACERONE) 200 MG tablet Take 1 tablet by mouth twice daily     atorvastatin (LIPITOR) 40 MG tablet Take 1 tablet (40 mg total) by mouth daily at 6 PM. (Patient not taking: Reported on 10/23/2022) 90 tablet 3   No current facility-administered medications for this encounter.    No Known Allergies  Social History   Socioeconomic History   Marital status: Single    Spouse name: Not on file   Number of children: Not on file   Years of education: Not on file   Highest education level: Not on file  Occupational History   Occupation: Furniture conservator/restorer  Tobacco Use   Smoking status: Former   Smokeless tobacco: Never   Tobacco comments:    Smoked for 20 years, quit ~1995  Vaping Use   Vaping Use: Never used  Substance and Sexual Activity   Alcohol use: No    Alcohol/week: 0.0 standard drinks of alcohol   Drug use: No   Sexual activity: Not on file  Other Topics Concern   Not on file  Social History Narrative   Not on file   Social Determinants of Health   Financial Resource Strain: Not on file  Food Insecurity: Not on file  Transportation Needs: Not on file  Physical Activity: Not on file  Stress: Not on file  Social Connections: Not on file  Intimate Partner Violence: Not on file   ROS- All systems are reviewed and negative except as per the HPI above.  Physical Exam: Vitals:   10/23/22 0847  BP: (!) 156/96  Pulse: 75  Weight: 124.1 kg  Height: 5' 10.5" (1.791 m)    GEN- The patient is a well appearing male, alert and oriented x 3 today.   Head- normocephalic, atraumatic Eyes-  Sclera clear, conjunctiva pink Ears- hearing intact Oropharynx-  clear Neck- supple  Lungs- Clear to ausculation bilaterally, normal work of breathing Heart- Irregular rate and rhythm, no murmurs, rubs or gallops  GI- soft, NT, ND, + BS Extremities- no clubbing, cyanosis, or edema MS- no significant deformity or atrophy Skin- no rash or lesion Psych- euthymic mood, full affect Neuro- strength and sensation are intact  Wt Readings from Last 3 Encounters:  10/23/22 124.1 kg  09/14/22 124.3 kg  07/19/22 122.3 kg    EKG today demonstrates Atrial flutter HR 75  Echo 08/21/21 demonstrated: 1. Left ventricular ejection fraction, by estimation, is 55 to 60%. The  left ventricle has normal function. The left ventricle has no regional  wall motion abnormalities. Left ventricular diastolic parameters are  indeterminate.   2. Right ventricular systolic function is normal.  The right ventricular  size is normal. There is normal pulmonary artery systolic pressure.   3. The mitral valve is normal in structure. Mild mitral valve  regurgitation.   4. The aortic valve is tricuspid. Aortic valve regurgitation is not  visualized. Aortic valve sclerosis is present, with no evidence of aortic  valve stenosis.   5. The inferior vena cava is dilated in size with <50% respiratory  variability, suggesting right atrial pressure of 15 mmHg.    Epic records are reviewed at length today  CHA2DS2-VASc Score = 2  The patient's score is based upon: CHF History: 1 HTN History: 1 Diabetes History: 0 Stroke History: 0 Vascular Disease History: 0 Age Score: 0 Gender Score: 0       ASSESSMENT AND PLAN: Longstanding Persistent Atrial Fibrillation (ICD10:  I48.11) / Atrial flutter The patient's CHA2DS2-VASc score is 2, indicating a 2.2% annual risk of stroke.    He appears to be in atrial flutter today.  He canceled the DCCV previously scheduled due to cost. He is still planning to do in ~August once Medicare begins. He is still interested in ablation after DCCV.    We will continue amiodarone daily for now and not make any changes to his current regimen.    2. Secondary hypercoagulable state secondary to atrial fibrillation  Currently on Xarelto daily; continue without change. No bleeding concerns.  3. Obesity Body mass index is 38.7 kg/m. Lifestyle modification was discussed at length including regular exercise and weight reduction. Encouraged daily walking  4. Obstructive sleep apnea The importance of adequate treatment of sleep apnea was discussed today in order to improve our ability to maintain sinus rhythm long term. He is compliant with CPAP.  5. Hypertension Elevated today, advised to please take additional half tab of losartan in the afternoon as prescribed.   Follow up as scheduled with Dr. Curt Bears. He will call us when his Medicare begins in order to schedule DCCV in August hopefully.   Emily Filbert, PA-C [facility] [address] [place], [place] [address] [phone] 10/23/2022 9:11 AM

## 2022-11-27 ENCOUNTER — Telehealth: Payer: Self-pay | Admitting: Cardiology

## 2022-11-27 NOTE — Telephone Encounter (Signed)
Patients machine is in process he only needs to call [facility] at [phone] to schedule.

## 2022-11-27 NOTE — Telephone Encounter (Signed)
Patient is calling in regards to his order with his CPAP machine. Please advise.

## 2022-11-28 NOTE — Telephone Encounter (Signed)
Patient states he cannot get anyone at [employer] to answer his questions or deliver his equipment. In addition, he says they keep trying to charge him varying amounts of money for his machine/equipment. He says he is also being told he needs to be switched to a "local branch" but no matter who he is referred to he cannot seem to get his billing information correct in terms of his patient responsibility or his equipment delivered. He is requesting to either be changed to a different DME company or he would like to have a handwritten script so he can buy a machine outright and avoid using insurance. Forwarded to sleep team.

## 2022-12-05 NOTE — Telephone Encounter (Addendum)
Patient got his cpap today it arrived at his door but he says he will send it back tomorrow because they are charging him $1,000 and he is not paying that for a cpap machine. He will purchase one online. I informed him if he has trouble with it none of our dme companies will work on it and he was ok with that.

## 2022-12-05 NOTE — Telephone Encounter (Signed)
Patient is following up regarding prescription for equipment.

## 2023-01-19 ENCOUNTER — Other Ambulatory Visit: Payer: Self-pay | Admitting: Cardiology

## 2023-03-21 ENCOUNTER — Ambulatory Visit: Payer: BC Managed Care – PPO | Admitting: Cardiology

## 2023-03-21 ENCOUNTER — Encounter (HOSPITAL_COMMUNITY): Payer: Self-pay | Admitting: Internal Medicine

## 2023-03-21 ENCOUNTER — Ambulatory Visit (HOSPITAL_COMMUNITY)
Admission: RE | Admit: 2023-03-21 | Discharge: 2023-03-21 | Disposition: A | Discharge status: Home / Self Care | Payer: BC Managed Care – PPO | Source: Ambulatory Visit | Attending: Cardiology | Admitting: Cardiology

## 2023-03-21 VITALS — BP 136/80 | HR 83 | Ht 70.5 in | Wt 269.6 lb

## 2023-03-21 DIAGNOSIS — Z5181 Encounter for therapeutic drug level monitoring: Secondary | ICD-10-CM

## 2023-03-21 DIAGNOSIS — I4811 Longstanding persistent atrial fibrillation: Secondary | ICD-10-CM | POA: Insufficient documentation

## 2023-03-21 DIAGNOSIS — Z7901 Long term (current) use of anticoagulants: Secondary | ICD-10-CM | POA: Diagnosis not present

## 2023-03-21 DIAGNOSIS — Z87891 Personal history of nicotine dependence: Secondary | ICD-10-CM | POA: Diagnosis not present

## 2023-03-21 DIAGNOSIS — I4892 Unspecified atrial flutter: Secondary | ICD-10-CM | POA: Diagnosis not present

## 2023-03-21 DIAGNOSIS — I480 Paroxysmal atrial fibrillation: Secondary | ICD-10-CM

## 2023-03-21 DIAGNOSIS — Z79899 Other long term (current) drug therapy: Secondary | ICD-10-CM | POA: Diagnosis not present

## 2023-03-21 LAB — COMPREHENSIVE METABOLIC PANEL
ALT: 25 U/L (ref 0–44)
AST: 18 U/L (ref 15–41)
Albumin: 4 g/dL (ref 3.5–5.0)
Alkaline Phosphatase: 43 U/L (ref 38–126)
Anion gap: 11 (ref 5–15)
BUN: 17 mg/dL (ref 8–23)
CO2: 22 mmol/L (ref 22–32)
Calcium: 9.4 mg/dL (ref 8.9–10.3)
Chloride: 103 mmol/L (ref 98–111)
Creatinine, Ser: 1.25 mg/dL — ABNORMAL HIGH (ref 0.61–1.24)
GFR, Estimated: >60 mL/min (ref >60)
Glucose, Bld: 273 mg/dL — ABNORMAL HIGH (ref 70–99)
Potassium: 4.1 mmol/L (ref 3.5–5.1)
Sodium: 136 mmol/L (ref 135–145)
Total Bilirubin: 0.7 mg/dL (ref 0.3–1.2)
Total Protein: 6.8 g/dL (ref 6.5–8.1)

## 2023-03-21 LAB — CBC
HCT: 47.5 % (ref 39.0–52.0)
Hemoglobin: 15.4 g/dL (ref 13.0–17.0)
MCH: 31.4 pg (ref 26.0–34.0)
MCHC: 32.4 g/dL (ref 30.0–36.0)
MCV: 96.7 fL (ref 80.0–100.0)
Platelets: 192 10*3/uL (ref 150–400)
RBC: 4.91 MIL/uL (ref 4.22–5.81)
RDW: 13.2 % (ref 11.5–15.5)
WBC: 6.6 10*3/uL (ref 4.0–10.5)
nRBC: 0 % (ref 0.0–0.2)

## 2023-03-21 LAB — TSH: TSH: 1.544 u[IU]/mL (ref 0.350–4.500)

## 2023-03-21 MED ORDER — AMIODARONE HCL 200 MG PO TABS: ORAL_TABLET | ORAL | Status: DC

## 2023-03-21 NOTE — Patient Instructions (Addendum)
Amiodarone -- 200mg  twice a day through September 5th then reduce to once a day   Cardioversion scheduled for: Friday, September 13th   - Arrive at the [facility] and go to admitting at 930am   - Do not eat or drink anything after midnight the night prior to your procedure.   - Take all your morning medication (except diabetic medications) with a sip of water prior to arrival.  - You will not be able to drive home after your procedure.    - Do NOT miss any doses of your blood thinner - if you should miss a dose please notify our office immediately.   - If you feel as if you go back into normal rhythm prior to scheduled cardioversion, please notify our office immediately.   If your procedure is canceled in the cardioversion suite you will be charged a cancellation fee.

## 2023-03-21 NOTE — Progress Notes (Signed)
Primary Care Physician: Patient, No Pcp Per Primary Cardiologist: Dr. Antoine Poche Primary Electrophysiologist: Dr. Elberta Fortis Referring Physician: N/A   James [name] is a 67 y.o. male with a history of tobacco use, hypertension, asthma, morbid obesity, nonischemic cardiomyopathy, obstructive sleep apnea, and longstanding persistent atrial fibrillation who presents for consultation in the [facility].  The patient was initially diagnosed with atrial fibrillation in 2017. He had TEE cardioversion at that time. He did not convert to sinus rhythm and was loaded on amiodarone. He did have a successful second attempt at cardioversion. He was found to have a dilated cardiomyopathy with ejection fraction 20 to 25%. He had a stress test that showed an inferior infarct with scarring and no ischemia. Follow-up echo showed an ejection fraction of 45% in 2019. January 2023 as ejection fraction was normal. Patient is on Xarelto for a CHADS2VASC score of 2.  On evaluation today, he was seen by Dr. Elberta Fortis on 09/14/22. He admitted to feeling fatigued and occasionally short of breath with all the walking he has to do at work. He was loaded on amiodarone and plan to perform DCCV. He canceled DCCV due to cost. His plan is to wait until August when he turns 62 and has Medicare. He still feels like he has been in atrial fibrillation all the time. He does admit to still feeling fatigued with the quantity of walking he has to do at his work. No chest pain. He has been compliant with amiodarone BID. He has been taking Xarelto daily and has no bleeding concerns. He is compliant with CPAP. He admits to only taking losartan in the morning and did not know he was supposed to take a half tablet in the afternoon.  On follow up 03/21/23, he is currently in atrial flutter. Patient does not have Medicare but would still like to go ahead and pursue DCCV. He missed a dose of Xarelto 2 days ago. Currently taking  amiodarone 200 mg daily.   Today, he denies symptoms of palpitations, chest pain, orthopnea, PND, lower extremity edema, dizziness, presyncope, syncope, snoring, daytime somnolence, bleeding, or neurologic sequela. The patient is tolerating medications without difficulties and is otherwise without complaint today.    Atrial Fibrillation Risk Factors:  he does have symptoms or diagnosis of sleep apnea. he is compliant with CPAP therapy. he does not have a history of rheumatic fever. he does not have a history of alcohol use. The patient does not have a history of early familial atrial fibrillation or other arrhythmias.  he has a BMI of Body mass index is 38.14 kg/m.Marland Kitchen Filed Weights   03/21/23 1042  Weight: 122.3 kg     Family History  Problem Relation Age of Onset   CAD Mother        CABG age 75   CAD Maternal Aunt        CABG age 49     Atrial Fibrillation Management history:  Previous antiarrhythmic drugs: amiodarone Previous cardioversions: 2017 x 2 Previous ablations: None CHADS2VASC score: 2 Anticoagulation history: Xarelto   Past Medical History:  Diagnosis Date   Asthma    Former tobacco use    Hypertension    Morbid obesity ([facility])    New onset atrial fibrillation ([facility]) 07/2015   Non-ischemic cardiomyopathy ([facility])    Suspected sleep apnea    Past Surgical History:  Procedure Laterality Date   CARDIOVERSION N/A 08/08/2015   Procedure: CARDIOVERSION;  Surgeon: Lars Masson, MD;  Location: [facility]  ENDOSCOPY;  Service: Cardiovascular;  Laterality: N/A;   CARDIOVERSION N/A 08/12/2015   Procedure: CARDIOVERSION;  Surgeon: Chilton Si, MD;  Location: [facility] ENDOSCOPY;  Service: Cardiovascular;  Laterality: N/A;   MOUTH SURGERY     TEE WITHOUT CARDIOVERSION N/A 08/08/2015   Procedure: TRANSESOPHAGEAL ECHOCARDIOGRAM (TEE);  Surgeon: Lars Masson, MD;  Location[facility] ENDOSCOPY;  Service: Cardiovascular;  Laterality: N/A;    Current Outpatient Medications   Medication Sig Dispense Refill   carvedilol (COREG) 25 MG tablet TAKE 2 TABLETS BY MOUTH IN THE MORNING AND TAKE 1 TABLET BY MOUTH IN THE AFTERNOON 270 tablet 3   losartan (COZAAR) 50 MG tablet TAKE 1 TABLET BY MOUTH IN THE MORNING AND  ONE-HALF  TABLET  IN  THE  AFTERNOON 135 tablet 3   MAGNESIUM PO Take 1 tablet by mouth 4 (four) times a week.     rivaroxaban (XARELTO) 20 MG TABS tablet Take 1 tablet (20 mg total) by mouth daily with supper. 90 tablet 1   spironolactone (ALDACTONE) 25 MG tablet Take 1 tablet (25 mg total) by mouth daily. 90 tablet 3   amiodarone (PACERONE) 200 MG tablet Take 1 tablet by mouth twice a day for 14 days then reduce to once a day     No current facility-administered medications for this encounter.    No Known Allergies  ROS- All systems are reviewed and negative except as per the HPI above.  Physical Exam: Vitals:   03/21/23 1042  BP: 136/80  Pulse: 83  Weight: 122.3 kg  Height: 5' 10.5" (1.791 m)    GEN- The patient is well appearing, alert and oriented x 3 today.   Neck - no JVD or carotid bruit noted Lungs- Clear to ausculation bilaterally, normal work of breathing Heart- Irregular rate and rhythm, no murmurs, rubs or gallops, PMI not laterally displaced Extremities- no clubbing, cyanosis, or edema Skin - no rash or ecchymosis noted   Wt Readings from Last 3 Encounters:  03/21/23 122.3 kg  10/23/22 124.1 kg  09/14/22 124.3 kg    EKG today demonstrates Vent. rate 83 BPM PR interval * ms QRS duration 88 ms QT/QTcB 402/472 ms P-R-T axes * 73 37 Atrial fibrillation Septal infarct , age undetermined Abnormal ECG When compared with ECG of 23-Oct-2022 09:03, PREVIOUS ECG IS PRESENT  Echo 08/21/21 demonstrated: 1. Left ventricular ejection fraction, by estimation, is 55 to 60%. The  left ventricle has normal function. The left ventricle has no regional  wall motion abnormalities. Left ventricular diastolic parameters are  indeterminate.    2. Right ventricular systolic function is normal. The right ventricular  size is normal. There is normal pulmonary artery systolic pressure.   3. The mitral valve is normal in structure. Mild mitral valve  regurgitation.   4. The aortic valve is tricuspid. Aortic valve regurgitation is not  visualized. Aortic valve sclerosis is present, with no evidence of aortic  valve stenosis.   5. The inferior vena cava is dilated in size with <50% respiratory  variability, suggesting right atrial pressure of 15 mmHg.    Epic records are reviewed at length today  CHA2DS2-VASc Score = 2  The patient's score is based upon: CHF History: 1 HTN History: 1 Diabetes History: 0 Stroke History: 0 Vascular Disease History: 0 Age Score: 0 Gender Score: 0      ASSESSMENT AND PLAN: Longstanding Persistent Atrial Fibrillation (ICD10:  I48.11) / Atrial flutter The patient's CHA2DS2-VASc score is 2, indicating a 2.2% annual risk of  stroke.    He appears to be in atrial flutter today. He would like to schedule DCCV.  Labs obtained today. Amiodarone reload 200 mg BID x 2 weeks then back to 200 mg once daily. Schedule DCCV 3 weeks from 8/21.  F/u 2 weeks after DCCV.   Informed Consent   Shared Decision Making/Informed Consent The risks (stroke, cardiac arrhythmias rarely resulting in the need for a temporary or permanent pacemaker, skin irritation or burns and complications associated with conscious sedation including aspiration, arrhythmia, respiratory failure and death), benefits (restoration of normal sinus rhythm) and alternatives of a direct current cardioversion were explained in detail to Mr. Cianflone and he agrees to proceed.       2. Secondary hypercoagulable state secondary to atrial fibrillation  Currently on Xarelto daily; continue without change. No bleeding concerns.   Follow up 2 weeks after DCCV.   Lake Bells, PA-C [facility] [address] [place], [place] [address] [phone] 03/21/2023 12:02 PM

## 2023-03-21 NOTE — H&P (View-Only) (Signed)
Primary Care Physician: Patient, No Pcp Per Primary Cardiologist: Dr. Antoine Poche Primary Electrophysiologist: Dr. Elberta Fortis Referring Physician: N/A   James [name] is a 67 y.o. male with a history of tobacco use, hypertension, asthma, morbid obesity, nonischemic cardiomyopathy, obstructive sleep apnea, and longstanding persistent atrial fibrillation who presents for consultation in the [facility].  The patient was initially diagnosed with atrial fibrillation in 2017. He had TEE cardioversion at that time. He did not convert to sinus rhythm and was loaded on amiodarone. He did have a successful second attempt at cardioversion. He was found to have a dilated cardiomyopathy with ejection fraction 20 to 25%. He had a stress test that showed an inferior infarct with scarring and no ischemia. Follow-up echo showed an ejection fraction of 45% in 2019. January 2023 as ejection fraction was normal. Patient is on Xarelto for a CHADS2VASC score of 2.  On evaluation today, he was seen by Dr. Elberta Fortis on 09/14/22. He admitted to feeling fatigued and occasionally short of breath with all the walking he has to do at work. He was loaded on amiodarone and plan to perform DCCV. He canceled DCCV due to cost. His plan is to wait until August when he turns 76 and has Medicare. He still feels like he has been in atrial fibrillation all the time. He does admit to still feeling fatigued with the quantity of walking he has to do at his work. No chest pain. He has been compliant with amiodarone BID. He has been taking Xarelto daily and has no bleeding concerns. He is compliant with CPAP. He admits to only taking losartan in the morning and did not know he was supposed to take a half tablet in the afternoon.  On follow up 03/21/23, he is currently in atrial flutter. Patient does not have Medicare but would still like to go ahead and pursue DCCV. He missed a dose of Xarelto 2 days ago. Currently taking  amiodarone 200 mg daily.   Today, he denies symptoms of palpitations, chest pain, orthopnea, PND, lower extremity edema, dizziness, presyncope, syncope, snoring, daytime somnolence, bleeding, or neurologic sequela. The patient is tolerating medications without difficulties and is otherwise without complaint today.    Atrial Fibrillation Risk Factors:  he does have symptoms or diagnosis of sleep apnea. he is compliant with CPAP therapy. he does not have a history of rheumatic fever. he does not have a history of alcohol use. The patient does not have a history of early familial atrial fibrillation or other arrhythmias.  he has a BMI of Body mass index is 38.14 kg/m.Marland Kitchen Filed Weights   03/21/23 1042  Weight: 122.3 kg     Family History  Problem Relation Age of Onset   CAD Mother        CABG age 1   CAD Maternal Aunt        CABG age 79     Atrial Fibrillation Management history:  Previous antiarrhythmic drugs: amiodarone Previous cardioversions: 2017 x 2 Previous ablations: None CHADS2VASC score: 2 Anticoagulation history: Xarelto   Past Medical History:  Diagnosis Date   Asthma    Former tobacco use    Hypertension    Morbid obesity (HCC)    New onset atrial fibrillation (HCC) 07/2015   Non-ischemic cardiomyopathy (HCC)    Suspected sleep apnea    Past Surgical History:  Procedure Laterality Date   CARDIOVERSION N/A 08/08/2015   Procedure: CARDIOVERSION;  Surgeon: Lars Masson, MD;  Location: [facility]  ENDOSCOPY;  Service: Cardiovascular;  Laterality: N/A;   CARDIOVERSION N/A 08/12/2015   Procedure: CARDIOVERSION;  Surgeon: Chilton Si, MD;  Location: [facility] ENDOSCOPY;  Service: Cardiovascular;  Laterality: N/A;   MOUTH SURGERY     TEE WITHOUT CARDIOVERSION N/A 08/08/2015   Procedure: TRANSESOPHAGEAL ECHOCARDIOGRAM (TEE);  Surgeon: Lars Masson, MD;  Location: [facility] ENDOSCOPY;  Service: Cardiovascular;  Laterality: N/A;    Current Outpatient Medications   Medication Sig Dispense Refill   carvedilol (COREG) 25 MG tablet TAKE 2 TABLETS BY MOUTH IN THE MORNING AND TAKE 1 TABLET BY MOUTH IN THE AFTERNOON 270 tablet 3   losartan (COZAAR) 50 MG tablet TAKE 1 TABLET BY MOUTH IN THE MORNING AND  ONE-HALF  TABLET  IN  THE  AFTERNOON 135 tablet 3   MAGNESIUM PO Take 1 tablet by mouth 4 (four) times a week.     rivaroxaban (XARELTO) 20 MG TABS tablet Take 1 tablet (20 mg total) by mouth daily with supper. 90 tablet 1   spironolactone (ALDACTONE) 25 MG tablet Take 1 tablet (25 mg total) by mouth daily. 90 tablet 3   amiodarone (PACERONE) 200 MG tablet Take 1 tablet by mouth twice a day for 14 days then reduce to once a day     No current facility-administered medications for this encounter.    No Known Allergies  ROS- All systems are reviewed and negative except as per the HPI above.  Physical Exam: Vitals:   03/21/23 1042  BP: 136/80  Pulse: 83  Weight: 122.3 kg  Height: 5' 10.5" (1.791 m)    GEN- The patient is well appearing, alert and oriented x 3 today.   Neck - no JVD or carotid bruit noted Lungs- Clear to ausculation bilaterally, normal work of breathing Heart- Irregular rate and rhythm, no murmurs, rubs or gallops, PMI not laterally displaced Extremities- no clubbing, cyanosis, or edema Skin - no rash or ecchymosis noted   Wt Readings from Last 3 Encounters:  03/21/23 122.3 kg  10/23/22 124.1 kg  09/14/22 124.3 kg    EKG today demonstrates Vent. rate 83 BPM PR interval * ms QRS duration 88 ms QT/QTcB 402/472 ms P-R-T axes * 73 37 Atrial fibrillation Septal infarct , age undetermined Abnormal ECG When compared with ECG of 23-Oct-2022 09:03, PREVIOUS ECG IS PRESENT  Echo 08/21/21 demonstrated: 1. Left ventricular ejection fraction, by estimation, is 55 to 60%. The  left ventricle has normal function. The left ventricle has no regional  wall motion abnormalities. Left ventricular diastolic parameters are  indeterminate.    2. Right ventricular systolic function is normal. The right ventricular  size is normal. There is normal pulmonary artery systolic pressure.   3. The mitral valve is normal in structure. Mild mitral valve  regurgitation.   4. The aortic valve is tricuspid. Aortic valve regurgitation is not  visualized. Aortic valve sclerosis is present, with no evidence of aortic  valve stenosis.   5. The inferior vena cava is dilated in size with <50% respiratory  variability, suggesting right atrial pressure of 15 mmHg.    Epic records are reviewed at length today  CHA2DS2-VASc Score = 2  The patient's score is based upon: CHF History: 1 HTN History: 1 Diabetes History: 0 Stroke History: 0 Vascular Disease History: 0 Age Score: 0 Gender Score: 0      ASSESSMENT AND PLAN: Longstanding Persistent Atrial Fibrillation (ICD10:  I48.11) / Atrial flutter The patient's CHA2DS2-VASc score is 2, indicating a 2.2% annual risk of  stroke.    He appears to be in atrial flutter today. He would like to schedule DCCV.  Labs obtained today. Amiodarone reload 200 mg BID x 2 weeks then back to 200 mg once daily. Schedule DCCV 3 weeks from 8/21.  F/u 2 weeks after DCCV.   Informed Consent   Shared Decision Making/Informed Consent The risks (stroke, cardiac arrhythmias rarely resulting in the need for a temporary or permanent pacemaker, skin irritation or burns and complications associated with conscious sedation including aspiration, arrhythmia, respiratory failure and death), benefits (restoration of normal sinus rhythm) and alternatives of a direct current cardioversion were explained in detail to Mr. Wies and he agrees to proceed.       2. Secondary hypercoagulable state secondary to atrial fibrillation  Currently on Xarelto daily; continue without change. No bleeding concerns.   Follow up 2 weeks after DCCV.   Lake Bells, PA-C Afib Clinic [facility] [address] [place], [place] [address] [phone] 03/21/2023 12:02 PM

## 2023-04-11 NOTE — Progress Notes (Signed)
Spoke to pt and instructed them to come at 0930 and to be NPO after 0000. Confirmed no missed doses of AC and instructed to take in AM with a small sip of water.   Confirmed that pt will have a ride home and someone to stay with them for 24 hours after the procedure.

## 2023-04-12 ENCOUNTER — Ambulatory Visit (HOSPITAL_COMMUNITY): Payer: BC Managed Care – PPO | Admitting: Anesthesiology

## 2023-04-12 ENCOUNTER — Ambulatory Visit (HOSPITAL_COMMUNITY)
Admission: RE | Admit: 2023-04-12 | Discharge: 2023-04-12 | Disposition: A | Discharge status: Home / Self Care | Payer: BC Managed Care – PPO | Attending: Cardiovascular Disease | Admitting: Cardiovascular Disease

## 2023-04-12 ENCOUNTER — Encounter (HOSPITAL_COMMUNITY)
Admission: RE | Disposition: A | Discharge status: Home / Self Care | Payer: Self-pay | Source: Home / Self Care | Attending: Cardiovascular Disease

## 2023-04-12 ENCOUNTER — Other Ambulatory Visit: Payer: Self-pay

## 2023-04-12 DIAGNOSIS — I4819 Other persistent atrial fibrillation: Secondary | ICD-10-CM

## 2023-04-12 DIAGNOSIS — Z87891 Personal history of nicotine dependence: Secondary | ICD-10-CM | POA: Insufficient documentation

## 2023-04-12 DIAGNOSIS — I428 Other cardiomyopathies: Secondary | ICD-10-CM | POA: Insufficient documentation

## 2023-04-12 DIAGNOSIS — Z6838 Body mass index (BMI) 38.0-38.9, adult: Secondary | ICD-10-CM | POA: Insufficient documentation

## 2023-04-12 DIAGNOSIS — Z7901 Long term (current) use of anticoagulants: Secondary | ICD-10-CM | POA: Insufficient documentation

## 2023-04-12 DIAGNOSIS — I509 Heart failure, unspecified: Secondary | ICD-10-CM | POA: Diagnosis not present

## 2023-04-12 DIAGNOSIS — Z8249 Family history of ischemic heart disease and other diseases of the circulatory system: Secondary | ICD-10-CM | POA: Diagnosis not present

## 2023-04-12 DIAGNOSIS — J45909 Unspecified asthma, uncomplicated: Secondary | ICD-10-CM | POA: Insufficient documentation

## 2023-04-12 DIAGNOSIS — I4892 Unspecified atrial flutter: Secondary | ICD-10-CM | POA: Diagnosis not present

## 2023-04-12 DIAGNOSIS — D6869 Other thrombophilia: Secondary | ICD-10-CM | POA: Insufficient documentation

## 2023-04-12 DIAGNOSIS — I4891 Unspecified atrial fibrillation: Secondary | ICD-10-CM

## 2023-04-12 DIAGNOSIS — I4811 Longstanding persistent atrial fibrillation: Secondary | ICD-10-CM | POA: Diagnosis present

## 2023-04-12 DIAGNOSIS — I10 Essential (primary) hypertension: Secondary | ICD-10-CM | POA: Diagnosis not present

## 2023-04-12 DIAGNOSIS — Z79899 Other long term (current) drug therapy: Secondary | ICD-10-CM | POA: Diagnosis not present

## 2023-04-12 HISTORY — PX: CARDIOVERSION: SHX1299

## 2023-04-12 LAB — GLUCOSE, CAPILLARY: Glucose-Capillary: 252 mg/dL — ABNORMAL HIGH (ref 70–99)

## 2023-04-12 SURGERY — CARDIOVERSION
Anesthesia: Monitor Anesthesia Care

## 2023-04-12 MED ORDER — SODIUM CHLORIDE 0.9 % IV SOLN: INTRAVENOUS | Status: DC

## 2023-04-12 MED ORDER — PROPOFOL 10 MG/ML IV BOLUS
INTRAVENOUS | Status: DC | PRN
  Administered 2023-04-12: 10 mg via INTRAVENOUS
  Administered 2023-04-12: 90 mg via INTRAVENOUS

## 2023-04-12 MED ORDER — CARVEDILOL 25 MG PO TABS: ORAL_TABLET | ORAL | 3 refills | Status: DC

## 2023-04-12 SURGICAL SUPPLY — 1 items: ELECT DEFIB PAD ADLT CADENCE (PAD) ×1 IMPLANT

## 2023-04-12 NOTE — Op Note (Signed)
Procedure: Electrical Cardioversion Indications:  Atrial Fibrillation  Procedure Details:  Consent: Risks of procedure as well as the alternatives and risks of each were explained to the (patient/caregiver).  Consent for procedure obtained.  Time Out: Verified patient identification, verified procedure, site/side was marked, verified correct patient position, special equipment/implants available, medications/allergies/relevent history reviewed, required imaging and test results available.  Performed  Patient placed on cardiac monitor, pulse oximetry, supplemental oxygen as necessary.  Sedation given:  propofol IV, Anesthesiology, Dr. Bradley Ferris Pacer pads placed anterior and posterior chest.  Cardioverted 3 time(s) with pressure on the anterior pad.  Cardioversion with synchronized biphasic 200J shock (only converted on final shock).  Evaluation: Findings: Post procedure EKG shows:  severe sinus bradycardia Complications: None Patient did tolerate procedure well.  Time Spent Directly with the Patient:  30 minutes   James [name] 04/12/2023, 11:09 AM

## 2023-04-12 NOTE — Interval H&P Note (Signed)
History and Physical Interval Note:  04/12/2023 9:59 AM  James [name]  has presented today for surgery, with the diagnosis of AFIB.  The various methods of treatment have been discussed with the patient and family. After consideration of risks, benefits and other options for treatment, the patient has consented to  Procedure(s): CARDIOVERSION (N/A) as a surgical intervention.  The patient's history has been reviewed, patient examined, no change in status, stable for surgery.  I have reviewed the patient's chart and labs.  Questions were answered to the patient's satisfaction.     [name]

## 2023-04-12 NOTE — Anesthesia Preprocedure Evaluation (Signed)
Anesthesia Evaluation  Patient identified by MRN, date of birth, ID band Patient awake    Reviewed: Allergy & Precautions, NPO status , Patient's Chart, lab work & pertinent test results  Airway Mallampati: III  TM Distance: >3 FB Neck ROM: Full    Dental no notable dental hx.    Pulmonary asthma , sleep apnea and Continuous Positive Airway Pressure Ventilation , former smoker   Pulmonary exam normal        Cardiovascular hypertension, Pt. on home beta blockers and Pt. on medications +CHF  Normal cardiovascular exam+ dysrhythmias Atrial Fibrillation      Neuro/Psych negative neurological ROS  negative psych ROS   GI/Hepatic negative GI ROS, Neg liver ROS,,,  Endo/Other  diabetes    Renal/GU Renal disease     Musculoskeletal negative musculoskeletal ROS (+)    Abdominal  (+) + obese  Peds  Hematology  (+) Blood dyscrasia (Xarelto)   Anesthesia Other Findings A-FIB  Reproductive/Obstetrics                             Anesthesia Physical Anesthesia Plan  ASA: 3  Anesthesia Plan: MAC   Post-op Pain Management:    Induction:   PONV Risk Score and Plan: 1 and Propofol infusion and Treatment may vary due to age or medical condition  Airway Management Planned: Simple Face Mask  Additional Equipment:   Intra-op Plan:   Post-operative Plan:   Informed Consent: I have reviewed the patients History and Physical, chart, labs and discussed the procedure including the risks, benefits and alternatives for the proposed anesthesia with the patient or authorized representative who has indicated his/her understanding and acceptance.     Dental advisory given  Plan Discussed with: CRNA  Anesthesia Plan Comments:        Anesthesia Quick Evaluation

## 2023-04-12 NOTE — Transfer of Care (Signed)
Immediate Anesthesia Transfer of Care Note  Patient: James [name]  Procedure(s) Performed: CARDIOVERSION  Patient Location: PACU and Cath Lab  Anesthesia Type:General  Level of Consciousness: awake, drowsy, patient cooperative, and responds to stimulation  Airway & Oxygen Therapy: Patient Spontanous Breathing and Patient connected to face mask oxygen  Post-op Assessment: Report given to RN and Post -op Vital signs reviewed and stable  Post vital signs: Reviewed and stable  Last Vitals:  Vitals Value Taken Time  BP    Temp    Pulse    Resp    SpO2      Last Pain:  Vitals:   04/12/23 0946  PainSc: 0-No pain         Complications: No notable events documented.

## 2023-04-12 NOTE — Anesthesia Postprocedure Evaluation (Signed)
Anesthesia Post Note  Patient: James [name]  Procedure(s) Performed: CARDIOVERSION     Patient location during evaluation: Cath Lab Anesthesia Type: MAC Level of consciousness: awake Pain management: pain level controlled Vital Signs Assessment: post-procedure vital signs reviewed and stable Respiratory status: spontaneous breathing, nonlabored ventilation and respiratory function stable Cardiovascular status: blood pressure returned to baseline and stable Postop Assessment: no apparent nausea or vomiting Anesthetic complications: no   No notable events documented.  Last Vitals:  Vitals:   04/12/23 1120 04/12/23 1125  BP: 109/64 111/67  Pulse: (!) 40 (!) 41  Resp: 14 11  Temp:    SpO2: 99% 99%    Last Pain:  Vitals:   04/12/23 1125  TempSrc:   PainSc: 0-No pain                 Evanna Washinton P Devany Aja

## 2023-04-15 ENCOUNTER — Encounter (HOSPITAL_COMMUNITY): Payer: Self-pay | Admitting: Cardiovascular Disease

## 2023-04-25 ENCOUNTER — Ambulatory Visit (HOSPITAL_COMMUNITY)
Admission: RE | Admit: 2023-04-25 | Discharge: 2023-04-25 | Disposition: A | Discharge status: Home / Self Care | Payer: BC Managed Care – PPO | Source: Ambulatory Visit | Attending: Internal Medicine | Admitting: Internal Medicine

## 2023-04-25 VITALS — BP 132/70 | HR 77 | Ht 70.0 in | Wt 264.0 lb

## 2023-04-25 DIAGNOSIS — Z6837 Body mass index (BMI) 37.0-37.9, adult: Secondary | ICD-10-CM | POA: Diagnosis not present

## 2023-04-25 DIAGNOSIS — I428 Other cardiomyopathies: Secondary | ICD-10-CM | POA: Insufficient documentation

## 2023-04-25 DIAGNOSIS — I4892 Unspecified atrial flutter: Secondary | ICD-10-CM | POA: Diagnosis not present

## 2023-04-25 DIAGNOSIS — I10 Essential (primary) hypertension: Secondary | ICD-10-CM | POA: Insufficient documentation

## 2023-04-25 DIAGNOSIS — R9431 Abnormal electrocardiogram [ECG] [EKG]: Secondary | ICD-10-CM | POA: Diagnosis not present

## 2023-04-25 DIAGNOSIS — G4733 Obstructive sleep apnea (adult) (pediatric): Secondary | ICD-10-CM | POA: Insufficient documentation

## 2023-04-25 DIAGNOSIS — I4819 Other persistent atrial fibrillation: Secondary | ICD-10-CM | POA: Diagnosis present

## 2023-04-25 DIAGNOSIS — Z7901 Long term (current) use of anticoagulants: Secondary | ICD-10-CM | POA: Diagnosis not present

## 2023-04-25 DIAGNOSIS — D6869 Other thrombophilia: Secondary | ICD-10-CM | POA: Diagnosis not present

## 2023-04-25 DIAGNOSIS — I4811 Longstanding persistent atrial fibrillation: Secondary | ICD-10-CM | POA: Insufficient documentation

## 2023-04-25 MED ORDER — AMIODARONE HCL 200 MG PO TABS: 200.0000 mg | ORAL_TABLET | Freq: Two times a day (BID) | ORAL | 0 refills | Status: DC

## 2023-04-25 NOTE — Progress Notes (Signed)
Primary Care Physician: Patient, No Pcp Per Primary Cardiologist: Dr. Antoine Poche Primary Electrophysiologist: Dr. Elberta Fortis Referring Physician: N/A   James [name] is a 67 y.o. male with a history of tobacco use, hypertension, asthma, morbid obesity, nonischemic cardiomyopathy, obstructive sleep apnea, and longstanding persistent atrial fibrillation who presents for consultation in the [facility].  The patient was initially diagnosed with atrial fibrillation in 2017. He had TEE cardioversion at that time. He did not convert to sinus rhythm and was loaded on amiodarone. He did have a successful second attempt at cardioversion. He was found to have a dilated cardiomyopathy with ejection fraction 20 to 25%. He had a stress test that showed an inferior infarct with scarring and no ischemia. Follow-up echo showed an ejection fraction of 45% in 2019. January 2023 as ejection fraction was normal. Patient is on Xarelto for a CHADS2VASC score of 2.  On evaluation today, he was seen by Dr. Elberta Fortis on 09/14/22. He admitted to feeling fatigued and occasionally short of breath with all the walking he has to do at work. He was loaded on amiodarone and plan to perform DCCV. He canceled DCCV due to cost. His plan is to wait until August when he turns 63 and has Medicare. He still feels like he has been in atrial fibrillation all the time. He does admit to still feeling fatigued with the quantity of walking he has to do at his work. No chest pain. He has been compliant with amiodarone BID. He has been taking Xarelto daily and has no bleeding concerns. He is compliant with CPAP. He admits to only taking losartan in the morning and did not know he was supposed to take a half tablet in the afternoon.  On follow up 03/21/23, he is currently in atrial flutter. Patient does not have Medicare but would still like to go ahead and pursue DCCV. He missed a dose of Xarelto 2 days ago. Currently taking  amiodarone 200 mg daily.   On f/u 04/25/23, he is currently in atrial flutter. S/p amiodarone reload and successful DCCV on 04/12/23 with conversion to NSR x 3 shocks. He feels like he went out of rhythm about 5 days after cardioversion. He does note that he felt better when he was in normal rhythm. He is currently taking amiodarone 200 mg daily. No missed doses of Xarelto.   Today, he denies symptoms of palpitations, chest pain, orthopnea, PND, lower extremity edema, dizziness, presyncope, syncope, snoring, daytime somnolence, bleeding, or neurologic sequela. The patient is tolerating medications without difficulties and is otherwise without complaint today.    Atrial Fibrillation Risk Factors:  he does have symptoms or diagnosis of sleep apnea. he is compliant with CPAP therapy. he does not have a history of rheumatic fever. he does not have a history of alcohol use. The patient does not have a history of early familial atrial fibrillation or other arrhythmias.  he has a BMI of Body mass index is 37.88 kg/m.Marland Kitchen Filed Weights   04/25/23 1118  Weight: 119.7 kg     Family History  Problem Relation Age of Onset   CAD Mother        CABG age 49   CAD Maternal Aunt        CABG age 22    Atrial Fibrillation Management history:  Previous antiarrhythmic drugs: amiodarone Previous cardioversions: 2017 x 2, 04/12/23 Previous ablations: None CHADS2VASC score: 2 Anticoagulation history: Xarelto   Past Medical History:  Diagnosis Date  Asthma    Former tobacco use    Hypertension    Morbid obesity (HCC)    New onset atrial fibrillation (HCC) 07/2015   Non-ischemic cardiomyopathy (HCC)    Suspected sleep apnea    Past Surgical History:  Procedure Laterality Date   CARDIOVERSION N/A 08/08/2015   Procedure: CARDIOVERSION;  Surgeon: Lars Masson, MD;  Location: [facility] ENDOSCOPY;  Service: Cardiovascular;  Laterality: N/A;   CARDIOVERSION N/A 08/12/2015   Procedure: CARDIOVERSION;   Surgeon: Chilton Si, MD;  Location: [facility] ENDOSCOPY;  Service: Cardiovascular;  Laterality: N/A;   CARDIOVERSION N/A 04/12/2023   Procedure: CARDIOVERSION;  Surgeon: Thurmon Fair, MD;  Location: [facility] INVASIVE CV LAB;  Service: Cardiovascular;  Laterality: N/A;   MOUTH SURGERY     TEE WITHOUT CARDIOVERSION N/A 08/08/2015   Procedure: TRANSESOPHAGEAL ECHOCARDIOGRAM (TEE);  Surgeon: Lars Masson, MD;  Location: [facility] ENDOSCOPY;  Service: Cardiovascular;  Laterality: N/A;    Current Outpatient Medications  Medication Sig Dispense Refill   carvedilol (COREG) 25 MG tablet TAKE 1 TABLETS BY MOUTH IN THE MORNING AND TAKE HALF TABLET BY MOUTH IN THE AFTERNOON (Patient taking differently: TAKE 1 TABLETS BY MOUTH IN THE MORNING AND TAKE HALF TABLET BY MOUTH AT NIGHT) 270 tablet 3   losartan (COZAAR) 50 MG tablet TAKE 1 TABLET BY MOUTH IN THE MORNING AND  ONE-HALF  TABLET  IN  THE  AFTERNOON (Patient taking differently: TAKE 1 TABLET BY MOUTH IN THE MORNING AND  ONE-HALF  TABLET AT NIGHT) 135 tablet 3   MAGNESIUM PO Take 500 mg by mouth daily.     Omega-3 Fatty Acids (FISH OIL PO) Take 200 mg by mouth daily.     rivaroxaban (XARELTO) 20 MG TABS tablet Take 1 tablet (20 mg total) by mouth daily with supper. 90 tablet 1   spironolactone (ALDACTONE) 25 MG tablet Take 1 tablet (25 mg total) by mouth daily. 90 tablet 3   amiodarone (PACERONE) 200 MG tablet Take 1 tablet (200 mg total) by mouth 2 (two) times daily. 60 tablet 0   No current facility-administered medications for this encounter.    Allergies  Allergen Reactions   Penicillins Other (See Comments)    Childhood    ROS- All systems are reviewed and negative except as per the HPI above.  Physical Exam: Vitals:   04/25/23 1118  BP: 132/70  Pulse: 77  Weight: 119.7 kg  Height: 5\' 10"  (1.778 m)    GEN- The patient is well appearing, alert and oriented x 3 today.   Neck - no JVD or carotid bruit noted Lungs- Clear to ausculation  bilaterally, normal work of breathing Heart- Irregular rate and rhythm, no murmurs, rubs or gallops, PMI not laterally displaced Extremities- no clubbing, cyanosis, or edema Skin - no rash or ecchymosis noted  Wt Readings from Last 3 Encounters:  04/25/23 119.7 kg  04/12/23 122.5 kg  03/21/23 122.3 kg    EKG today demonstrates Vent. rate 77 BPM PR interval * ms QRS duration 90 ms QT/QTcB 430/486 ms P-R-T axes 238 43 36 Atrial flutter with variable A-V block Prolonged QT Abnormal ECG When compared with ECG of 12-Apr-2023 11:14, PREVIOUS ECG IS PRESENT  Echo 08/21/21 demonstrated: 1. Left ventricular ejection fraction, by estimation, is 55 to 60%. The  left ventricle has normal function. The left ventricle has no regional  wall motion abnormalities. Left ventricular diastolic parameters are  indeterminate.   2. Right ventricular systolic function is normal. The right ventricular  size  is normal. There is normal pulmonary artery systolic pressure.   3. The mitral valve is normal in structure. Mild mitral valve  regurgitation.   4. The aortic valve is tricuspid. Aortic valve regurgitation is not  visualized. Aortic valve sclerosis is present, with no evidence of aortic  valve stenosis.   5. The inferior vena cava is dilated in size with <50% respiratory  variability, suggesting right atrial pressure of 15 mmHg.    Epic records are reviewed at length today  CHA2DS2-VASc Score = 2  The patient's score is based upon: CHF History: 1 HTN History: 1 Diabetes History: 0 Stroke History: 0 Vascular Disease History: 0 Age Score: 0 Gender Score: 0      ASSESSMENT AND PLAN: Longstanding Persistent Atrial Fibrillation (ICD10:  I48.11) / Atrial flutter The patient's CHA2DS2-VASc score is 2, indicating a 2.2% annual risk of stroke.   S/p successful DCCV on 04/12/23 x 3 shocks.   He appears to be in atrial flutter today.  We will attempt an amiodarone reload for 1 month before  considering repeat cardioversion. He was able to maintain normal rhythm for several days post cardioversion on 9/13. If he goes back out of rhythm despite 1 month amiodarone reload, would have to discuss with EP if can be considered candidate for ablation.   2. Secondary hypercoagulable state secondary to atrial fibrillation  Currently on Xarelto daily; continue without change. No bleeding concerns.   Follow up 2-3 weeks.   Lake Bells, PA-C Afib Clinic [facility] [address] [place], [place] [address] [phone] 04/25/2023 11:42 AM

## 2023-04-25 NOTE — Patient Instructions (Signed)
Amiodarone 200 mg twice a day

## 2023-05-17 ENCOUNTER — Ambulatory Visit (HOSPITAL_COMMUNITY)
Admission: RE | Admit: 2023-05-17 | Discharge: 2023-05-17 | Disposition: A | Discharge status: Home / Self Care | Payer: BC Managed Care – PPO | Source: Ambulatory Visit | Attending: Internal Medicine | Admitting: Internal Medicine

## 2023-05-17 ENCOUNTER — Encounter (HOSPITAL_COMMUNITY): Payer: Self-pay | Admitting: Internal Medicine

## 2023-05-17 VITALS — BP 138/88 | HR 66 | Ht 70.0 in | Wt 273.4 lb

## 2023-05-17 DIAGNOSIS — Z87891 Personal history of nicotine dependence: Secondary | ICD-10-CM | POA: Insufficient documentation

## 2023-05-17 DIAGNOSIS — Z79899 Other long term (current) drug therapy: Secondary | ICD-10-CM

## 2023-05-17 DIAGNOSIS — I10 Essential (primary) hypertension: Secondary | ICD-10-CM | POA: Diagnosis not present

## 2023-05-17 DIAGNOSIS — G4733 Obstructive sleep apnea (adult) (pediatric): Secondary | ICD-10-CM | POA: Diagnosis not present

## 2023-05-17 DIAGNOSIS — Z7901 Long term (current) use of anticoagulants: Secondary | ICD-10-CM | POA: Insufficient documentation

## 2023-05-17 DIAGNOSIS — Z5181 Encounter for therapeutic drug level monitoring: Secondary | ICD-10-CM | POA: Diagnosis not present

## 2023-05-17 DIAGNOSIS — Z6839 Body mass index (BMI) 39.0-39.9, adult: Secondary | ICD-10-CM | POA: Diagnosis not present

## 2023-05-17 DIAGNOSIS — J45909 Unspecified asthma, uncomplicated: Secondary | ICD-10-CM | POA: Insufficient documentation

## 2023-05-17 DIAGNOSIS — I428 Other cardiomyopathies: Secondary | ICD-10-CM | POA: Diagnosis not present

## 2023-05-17 DIAGNOSIS — R9431 Abnormal electrocardiogram [ECG] [EKG]: Secondary | ICD-10-CM | POA: Insufficient documentation

## 2023-05-17 DIAGNOSIS — I4811 Longstanding persistent atrial fibrillation: Secondary | ICD-10-CM

## 2023-05-17 LAB — BASIC METABOLIC PANEL WITH GFR
Anion gap: 12 (ref 5–15)
BUN: 18 mg/dL (ref 8–23)
CO2: 20 mmol/L — ABNORMAL LOW (ref 22–32)
Calcium: 9.1 mg/dL (ref 8.9–10.3)
Chloride: 105 mmol/L (ref 98–111)
Creatinine, Ser: 1.18 mg/dL (ref 0.61–1.24)
GFR, Estimated: >60 mL/min
Glucose, Bld: 193 mg/dL — ABNORMAL HIGH (ref 70–99)
Potassium: 4 mmol/L (ref 3.5–5.1)
Sodium: 137 mmol/L (ref 135–145)

## 2023-05-17 LAB — CBC
HCT: 49 % (ref 39.0–52.0)
Hemoglobin: 15.8 g/dL (ref 13.0–17.0)
MCH: 30.7 pg (ref 26.0–34.0)
MCHC: 32.2 g/dL (ref 30.0–36.0)
MCV: 95.3 fL (ref 80.0–100.0)
Platelets: 150 10*3/uL (ref 150–400)
RBC: 5.14 MIL/uL (ref 4.22–5.81)
RDW: 13.5 % (ref 11.5–15.5)
WBC: 6.5 10*3/uL (ref 4.0–10.5)
nRBC: 0 % (ref 0.0–0.2)

## 2023-05-17 NOTE — Progress Notes (Signed)
Primary Care Physician: Patient, No Pcp Per Primary Cardiologist: Dr. Antoine Poche Primary Electrophysiologist: Dr. Elberta Fortis Referring Physician: N/A   James [name] is a 67 y.o. male with a history of tobacco use, hypertension, asthma, morbid obesity, nonischemic cardiomyopathy, obstructive sleep apnea, and longstanding persistent atrial fibrillation who presents for consultation in the [facility].  The patient was initially diagnosed with atrial fibrillation in 2017. He had TEE cardioversion at that time. He did not convert to sinus rhythm and was loaded on amiodarone. He did have a successful second attempt at cardioversion. He was found to have a dilated cardiomyopathy with ejection fraction 20 to 25%. He had a stress test that showed an inferior infarct with scarring and no ischemia. Follow-up echo showed an ejection fraction of 45% in 2019. January 2023 as ejection fraction was normal. Patient is on Xarelto for a CHADS2VASC score of 2.  On evaluation today, he was seen by Dr. Elberta Fortis on 09/14/22. He admitted to feeling fatigued and occasionally short of breath with all the walking he has to do at work. He was loaded on amiodarone and plan to perform DCCV. He canceled DCCV due to cost. His plan is to wait until August when he turns 34 and has Medicare. He still feels like he has been in atrial fibrillation all the time. He does admit to still feeling fatigued with the quantity of walking he has to do at his work. No chest pain. He has been compliant with amiodarone BID. He has been taking Xarelto daily and has no bleeding concerns. He is compliant with CPAP. He admits to only taking losartan in the morning and did not know he was supposed to take a half tablet in the afternoon.  On follow up 03/21/23, he is currently in atrial flutter. Patient does not have Medicare but would still like to go ahead and pursue DCCV. He missed a dose of Xarelto 2 days ago. Currently taking  amiodarone 200 mg daily.   On f/u 04/25/23, he is currently in atrial flutter. S/p amiodarone reload and successful DCCV on 04/12/23 with conversion to NSR x 3 shocks. He feels like he went out of rhythm about 5 days after cardioversion. He does note that he felt better when he was in normal rhythm. He is currently taking amiodarone 200 mg daily. No missed doses of Xarelto.   On f/u 05/17/23, he is currently in atrial flutter. He feels tired when out of rhythm. He began amiodarone reload at last visit to determine final time if able to maintain normal rhythm on amiodarone or if discussion required with EP on next steps. He is currently taking amiodarone 200 mg BID. No missed doses of Xarelto.   Today, he denies symptoms of palpitations, chest pain, orthopnea, PND, lower extremity edema, dizziness, presyncope, syncope, snoring, daytime somnolence, bleeding, or neurologic sequela. The patient is tolerating medications without difficulties and is otherwise without complaint today.    Atrial Fibrillation Risk Factors:  he does have symptoms or diagnosis of sleep apnea. he is compliant with CPAP therapy. he does not have a history of rheumatic fever. he does not have a history of alcohol use. The patient does not have a history of early familial atrial fibrillation or other arrhythmias.  he has a BMI of Body mass index is 39.23 kg/m.Marland Kitchen Filed Weights   05/17/23 1050  Weight: 124 kg      Family History  Problem Relation Age of Onset   CAD Mother  CABG age 64   CAD Maternal Aunt        CABG age 53    Atrial Fibrillation Management history:  Previous antiarrhythmic drugs: amiodarone Previous cardioversions: 2017 x 2, 04/12/23 Previous ablations: None CHADS2VASC score: 2 Anticoagulation history: Xarelto   Past Medical History:  Diagnosis Date   Asthma    Former tobacco use    Hypertension    Morbid obesity (HCC)    New onset atrial fibrillation (HCC) 07/2015   Non-ischemic  cardiomyopathy (HCC)    Suspected sleep apnea    Past Surgical History:  Procedure Laterality Date   CARDIOVERSION N/A 08/08/2015   Procedure: CARDIOVERSION;  Surgeon: Lars Masson, MD;  Location: [facility] ENDOSCOPY;  Service: Cardiovascular;  Laterality: N/A;   CARDIOVERSION N/A 08/12/2015   Procedure: CARDIOVERSION;  Surgeon: Chilton Si, MD;  Location: [facility] ENDOSCOPY;  Service: Cardiovascular;  Laterality: N/A;   CARDIOVERSION N/A 04/12/2023   Procedure: CARDIOVERSION;  Surgeon: Thurmon Fair, MD;  Location: [facility] INVASIVE CV LAB;  Service: Cardiovascular;  Laterality: N/A;   MOUTH SURGERY     TEE WITHOUT CARDIOVERSION N/A 08/08/2015   Procedure: TRANSESOPHAGEAL ECHOCARDIOGRAM (TEE);  Surgeon: Lars Masson, MD;  Location: [facility] ENDOSCOPY;  Service: Cardiovascular;  Laterality: N/A;    Current Outpatient Medications  Medication Sig Dispense Refill   amiodarone (PACERONE) 200 MG tablet Take 1 tablet (200 mg total) by mouth 2 (two) times daily. 60 tablet 0   carvedilol (COREG) 25 MG tablet TAKE 1 TABLETS BY MOUTH IN THE MORNING AND TAKE HALF TABLET BY MOUTH IN THE AFTERNOON 270 tablet 3   losartan (COZAAR) 50 MG tablet TAKE 1 TABLET BY MOUTH IN THE MORNING AND  ONE-HALF  TABLET  IN  THE  AFTERNOON 135 tablet 3   MAGNESIUM PO Take 500 mg by mouth daily.     Omega-3 Fatty Acids (FISH OIL PO) Take 200 mg by mouth daily.     rivaroxaban (XARELTO) 20 MG TABS tablet Take 1 tablet (20 mg total) by mouth daily with supper. 90 tablet 1   spironolactone (ALDACTONE) 25 MG tablet Take 1 tablet (25 mg total) by mouth daily. 90 tablet 3   No current facility-administered medications for this encounter.    Allergies  Allergen Reactions   Penicillins Other (See Comments)    Childhood    ROS- All systems are reviewed and negative except as per the HPI above.  Physical Exam: Vitals:   05/17/23 1050  BP: 138/88  Pulse: 66  Weight: 124 kg  Height: 5\' 10"  (1.778 m)    GEN- The patient is well  appearing, alert and oriented x 3 today.   Neck - no JVD or carotid bruit noted Lungs- Clear to ausculation bilaterally, normal work of breathing Heart- Irregular rate and rhythm, no murmurs, rubs or gallops, PMI not laterally displaced Extremities- no clubbing, cyanosis, or edema Skin - no rash or ecchymosis noted   Wt Readings from Last 3 Encounters:  05/17/23 124 kg  04/25/23 119.7 kg  04/12/23 122.5 kg    EKG today demonstrates Vent. rate 66 BPM PR interval * ms QRS duration 86 ms QT/QTcB 440/461 ms P-R-T axes * 83 33 Atrial flutter with variable A-V block Abnormal ECG When compared with ECG of 25-Apr-2023 11:25, PREVIOUS ECG IS PRESENT  Echo 08/21/21 demonstrated: 1. Left ventricular ejection fraction, by estimation, is 55 to 60%. The  left ventricle has normal function. The left ventricle has no regional  wall motion abnormalities. Left ventricular diastolic parameters  are  indeterminate.   2. Right ventricular systolic function is normal. The right ventricular  size is normal. There is normal pulmonary artery systolic pressure.   3. The mitral valve is normal in structure. Mild mitral valve  regurgitation.   4. The aortic valve is tricuspid. Aortic valve regurgitation is not  visualized. Aortic valve sclerosis is present, with no evidence of aortic  valve stenosis.   5. The inferior vena cava is dilated in size with <50% respiratory  variability, suggesting right atrial pressure of 15 mmHg.    Epic records are reviewed at length today  CHA2DS2-VASc Score = 2  The patient's score is based upon: CHF History: 1 HTN History: 1 Diabetes History: 0 Stroke History: 0 Vascular Disease History: 0 Age Score: 0 Gender Score: 0      ASSESSMENT AND PLAN: Longstanding Persistent Atrial Fibrillation (ICD10:  I48.11) / Atrial flutter The patient's CHA2DS2-VASc score is 2, indicating a 2.2% annual risk of stroke.   S/p successful DCCV on 04/12/23 x 3 shocks.   He  appears to be in atrial flutter today. Continue amiodarone 200 mg BID for total of 1 month then transition back to amiodarone 200 mg once daily. After discussion with patient, he agrees to proceed with cardioversion after this reload of amiodarone. Labs obtained today.  Informed Consent   Shared Decision Making/Informed Consent The risks (stroke, cardiac arrhythmias rarely resulting in the need for a temporary or permanent pacemaker, skin irritation or burns and complications associated with conscious sedation including aspiration, arrhythmia, respiratory failure and death), benefits (restoration of normal sinus rhythm) and alternatives of a direct current cardioversion were explained in detail to Mr. Pickman and he agrees to proceed.       He was able to maintain normal rhythm for several days post cardioversion on 9/13. If he goes back out of rhythm again despite 1 month amiodarone reload, would have to discuss with EP if can be considered candidate for ablation.   2. Secondary hypercoagulable state secondary to atrial fibrillation  Currently on Xarelto daily; continue without change. No bleeding concerns. No missed doses.   Follow up 2 weeks after DCCV.    Lake Bells, PA-C [facility] [address] [place], [place] [address] [phone] 05/17/2023 11:13 AM

## 2023-05-17 NOTE — Patient Instructions (Addendum)
On October 27th - reduce amiodarone to 200mg  once a day   Cardioversion scheduled for: Monday, October 28th   - Arrive at the [facility] and go to admitting at 10am   - Do not eat or drink anything after midnight the night prior to your procedure.   - Take all your morning medication (except diabetic medications) with a sip of water prior to arrival.  - You will not be able to drive home after your procedure.    - Do NOT miss any doses of your blood thinner - if you should miss a dose please notify our office immediately.   - If you feel as if you go back into normal rhythm prior to scheduled cardioversion, please notify our office immediately.   If your procedure is canceled in the cardioversion suite you will be charged a cancellation fee.

## 2023-05-17 NOTE — H&P (View-Only) (Signed)
Primary Care Physician: Patient, No Pcp Per Primary Cardiologist: Dr. Antoine Poche Primary Electrophysiologist: Dr. Elberta Fortis Referring Physician: N/A   James [name] is a 67 y.o. male with a history of tobacco use, hypertension, asthma, morbid obesity, nonischemic cardiomyopathy, obstructive sleep apnea, and longstanding persistent atrial fibrillation who presents for consultation in the [facility].  The patient was initially diagnosed with atrial fibrillation in 2017. He had TEE cardioversion at that time. He did not convert to sinus rhythm and was loaded on amiodarone. He did have a successful second attempt at cardioversion. He was found to have a dilated cardiomyopathy with ejection fraction 20 to 25%. He had a stress test that showed an inferior infarct with scarring and no ischemia. Follow-up echo showed an ejection fraction of 45% in 2019. January 2023 as ejection fraction was normal. Patient is on Xarelto for a CHADS2VASC score of 2.  On evaluation today, he was seen by Dr. Elberta Fortis on 09/14/22. He admitted to feeling fatigued and occasionally short of breath with all the walking he has to do at work. He was loaded on amiodarone and plan to perform DCCV. He canceled DCCV due to cost. His plan is to wait until August when he turns 34 and has Medicare. He still feels like he has been in atrial fibrillation all the time. He does admit to still feeling fatigued with the quantity of walking he has to do at his work. No chest pain. He has been compliant with amiodarone BID. He has been taking Xarelto daily and has no bleeding concerns. He is compliant with CPAP. He admits to only taking losartan in the morning and did not know he was supposed to take a half tablet in the afternoon.  On follow up 03/21/23, he is currently in atrial flutter. Patient does not have Medicare but would still like to go ahead and pursue DCCV. He missed a dose of Xarelto 2 days ago. Currently taking  amiodarone 200 mg daily.   On f/u 04/25/23, he is currently in atrial flutter. S/p amiodarone reload and successful DCCV on 04/12/23 with conversion to NSR x 3 shocks. He feels like he went out of rhythm about 5 days after cardioversion. He does note that he felt better when he was in normal rhythm. He is currently taking amiodarone 200 mg daily. No missed doses of Xarelto.   On f/u 05/17/23, he is currently in atrial flutter. He feels tired when out of rhythm. He began amiodarone reload at last visit to determine final time if able to maintain normal rhythm on amiodarone or if discussion required with EP on next steps. He is currently taking amiodarone 200 mg BID. No missed doses of Xarelto.   Today, he denies symptoms of palpitations, chest pain, orthopnea, PND, lower extremity edema, dizziness, presyncope, syncope, snoring, daytime somnolence, bleeding, or neurologic sequela. The patient is tolerating medications without difficulties and is otherwise without complaint today.    Atrial Fibrillation Risk Factors:  he does have symptoms or diagnosis of sleep apnea. he is compliant with CPAP therapy. he does not have a history of rheumatic fever. he does not have a history of alcohol use. The patient does not have a history of early familial atrial fibrillation or other arrhythmias.  he has a BMI of Body mass index is 39.23 kg/m.Marland Kitchen Filed Weights   05/17/23 1050  Weight: 124 kg      Family History  Problem Relation Age of Onset   CAD Mother  CABG age 64   CAD Maternal Aunt        CABG age 53    Atrial Fibrillation Management history:  Previous antiarrhythmic drugs: amiodarone Previous cardioversions: 2017 x 2, 04/12/23 Previous ablations: None CHADS2VASC score: 2 Anticoagulation history: Xarelto   Past Medical History:  Diagnosis Date   Asthma    Former tobacco use    Hypertension    Morbid obesity (HCC)    New onset atrial fibrillation (HCC) 07/2015   Non-ischemic  cardiomyopathy (HCC)    Suspected sleep apnea    Past Surgical History:  Procedure Laterality Date   CARDIOVERSION N/A 08/08/2015   Procedure: CARDIOVERSION;  Surgeon: Lars Masson, MD;  Location: [facility] ENDOSCOPY;  Service: Cardiovascular;  Laterality: N/A;   CARDIOVERSION N/A 08/12/2015   Procedure: CARDIOVERSION;  Surgeon: Chilton Si, MD;  Location: [facility] ENDOSCOPY;  Service: Cardiovascular;  Laterality: N/A;   CARDIOVERSION N/A 04/12/2023   Procedure: CARDIOVERSION;  Surgeon: Thurmon Fair, MD;  Location: [facility] INVASIVE CV LAB;  Service: Cardiovascular;  Laterality: N/A;   MOUTH SURGERY     TEE WITHOUT CARDIOVERSION N/A 08/08/2015   Procedure: TRANSESOPHAGEAL ECHOCARDIOGRAM (TEE);  Surgeon: Lars Masson, MD;  Location: [facility] ENDOSCOPY;  Service: Cardiovascular;  Laterality: N/A;    Current Outpatient Medications  Medication Sig Dispense Refill   amiodarone (PACERONE) 200 MG tablet Take 1 tablet (200 mg total) by mouth 2 (two) times daily. 60 tablet 0   carvedilol (COREG) 25 MG tablet TAKE 1 TABLETS BY MOUTH IN THE MORNING AND TAKE HALF TABLET BY MOUTH IN THE AFTERNOON 270 tablet 3   losartan (COZAAR) 50 MG tablet TAKE 1 TABLET BY MOUTH IN THE MORNING AND  ONE-HALF  TABLET  IN  THE  AFTERNOON 135 tablet 3   MAGNESIUM PO Take 500 mg by mouth daily.     Omega-3 Fatty Acids (FISH OIL PO) Take 200 mg by mouth daily.     rivaroxaban (XARELTO) 20 MG TABS tablet Take 1 tablet (20 mg total) by mouth daily with supper. 90 tablet 1   spironolactone (ALDACTONE) 25 MG tablet Take 1 tablet (25 mg total) by mouth daily. 90 tablet 3   No current facility-administered medications for this encounter.    Allergies  Allergen Reactions   Penicillins Other (See Comments)    Childhood    ROS- All systems are reviewed and negative except as per the HPI above.  Physical Exam: Vitals:   05/17/23 1050  BP: 138/88  Pulse: 66  Weight: 124 kg  Height: 5\' 10"  (1.778 m)    GEN- The patient is well  appearing, alert and oriented x 3 today.   Neck - no JVD or carotid bruit noted Lungs- Clear to ausculation bilaterally, normal work of breathing Heart- Irregular rate and rhythm, no murmurs, rubs or gallops, PMI not laterally displaced Extremities- no clubbing, cyanosis, or edema Skin - no rash or ecchymosis noted   Wt Readings from Last 3 Encounters:  05/17/23 124 kg  04/25/23 119.7 kg  04/12/23 122.5 kg    EKG today demonstrates Vent. rate 66 BPM PR interval * ms QRS duration 86 ms QT/QTcB 440/461 ms P-R-T axes * 83 33 Atrial flutter with variable A-V block Abnormal ECG When compared with ECG of 25-Apr-2023 11:25, PREVIOUS ECG IS PRESENT  Echo 08/21/21 demonstrated: 1. Left ventricular ejection fraction, by estimation, is 55 to 60%. The  left ventricle has normal function. The left ventricle has no regional  wall motion abnormalities. Left ventricular diastolic parameters  are  indeterminate.   2. Right ventricular systolic function is normal. The right ventricular  size is normal. There is normal pulmonary artery systolic pressure.   3. The mitral valve is normal in structure. Mild mitral valve  regurgitation.   4. The aortic valve is tricuspid. Aortic valve regurgitation is not  visualized. Aortic valve sclerosis is present, with no evidence of aortic  valve stenosis.   5. The inferior vena cava is dilated in size with <50% respiratory  variability, suggesting right atrial pressure of 15 mmHg.    Epic records are reviewed at length today  CHA2DS2-VASc Score = 2  The patient's score is based upon: CHF History: 1 HTN History: 1 Diabetes History: 0 Stroke History: 0 Vascular Disease History: 0 Age Score: 0 Gender Score: 0      ASSESSMENT AND PLAN: Longstanding Persistent Atrial Fibrillation (ICD10:  I48.11) / Atrial flutter The patient's CHA2DS2-VASc score is 2, indicating a 2.2% annual risk of stroke.   S/p successful DCCV on 04/12/23 x 3 shocks.   He  appears to be in atrial flutter today. Continue amiodarone 200 mg BID for total of 1 month then transition back to amiodarone 200 mg once daily. After discussion with patient, he agrees to proceed with cardioversion after this reload of amiodarone. Labs obtained today.  Informed Consent   Shared Decision Making/Informed Consent The risks (stroke, cardiac arrhythmias rarely resulting in the need for a temporary or permanent pacemaker, skin irritation or burns and complications associated with conscious sedation including aspiration, arrhythmia, respiratory failure and death), benefits (restoration of normal sinus rhythm) and alternatives of a direct current cardioversion were explained in detail to Mr. Pickman and he agrees to proceed.       He was able to maintain normal rhythm for several days post cardioversion on 9/13. If he goes back out of rhythm again despite 1 month amiodarone reload, would have to discuss with EP if can be considered candidate for ablation.   2. Secondary hypercoagulable state secondary to atrial fibrillation  Currently on Xarelto daily; continue without change. No bleeding concerns. No missed doses.   Follow up 2 weeks after DCCV.    Lake Bells, PA-C [facility] [address] [place], [place] [address] [phone] 05/17/2023 11:13 AM

## 2023-05-24 NOTE — OR Nursing (Signed)
Called patient with pre-procedure instructions for tomorrow.   Patient informed of:   Time to arrive for procedure.1015 Remain NPO past midnight.  Must have a ride home and a responsible adult to remain with them for 24 ours post procedure.  Confirmed blood thinner. Confirmed no breaks in taking blood thinner for 3+ weeks prior to procedure. Confirmed patient stopped all GLP-1s and GLP-2s for at least one week before procedure.   Spoke with patient regarding above information.

## 2023-05-27 ENCOUNTER — Other Ambulatory Visit: Payer: Self-pay

## 2023-05-27 ENCOUNTER — Encounter (HOSPITAL_COMMUNITY): Payer: Self-pay | Admitting: Internal Medicine

## 2023-05-27 ENCOUNTER — Ambulatory Visit (HOSPITAL_COMMUNITY): Payer: Self-pay | Admitting: Anesthesiology

## 2023-05-27 ENCOUNTER — Encounter (HOSPITAL_COMMUNITY)
Admission: RE | Disposition: A | Discharge status: Home / Self Care | Payer: Self-pay | Source: Home / Self Care | Attending: Internal Medicine

## 2023-05-27 ENCOUNTER — Ambulatory Visit (HOSPITAL_COMMUNITY)
Admission: RE | Admit: 2023-05-27 | Discharge: 2023-05-27 | Disposition: A | Discharge status: Home / Self Care | Payer: BC Managed Care – PPO | Attending: Internal Medicine | Admitting: Internal Medicine

## 2023-05-27 ENCOUNTER — Ambulatory Visit (HOSPITAL_COMMUNITY): Payer: BC Managed Care – PPO | Admitting: Anesthesiology

## 2023-05-27 DIAGNOSIS — I4811 Longstanding persistent atrial fibrillation: Secondary | ICD-10-CM | POA: Diagnosis present

## 2023-05-27 DIAGNOSIS — Z6839 Body mass index (BMI) 39.0-39.9, adult: Secondary | ICD-10-CM | POA: Diagnosis not present

## 2023-05-27 DIAGNOSIS — Z87891 Personal history of nicotine dependence: Secondary | ICD-10-CM | POA: Insufficient documentation

## 2023-05-27 DIAGNOSIS — Z7901 Long term (current) use of anticoagulants: Secondary | ICD-10-CM | POA: Diagnosis not present

## 2023-05-27 DIAGNOSIS — G4733 Obstructive sleep apnea (adult) (pediatric): Secondary | ICD-10-CM | POA: Diagnosis not present

## 2023-05-27 DIAGNOSIS — I10 Essential (primary) hypertension: Secondary | ICD-10-CM | POA: Insufficient documentation

## 2023-05-27 DIAGNOSIS — I4891 Unspecified atrial fibrillation: Secondary | ICD-10-CM

## 2023-05-27 DIAGNOSIS — D6869 Other thrombophilia: Secondary | ICD-10-CM | POA: Diagnosis not present

## 2023-05-27 DIAGNOSIS — Z79899 Other long term (current) drug therapy: Secondary | ICD-10-CM | POA: Diagnosis not present

## 2023-05-27 DIAGNOSIS — I428 Other cardiomyopathies: Secondary | ICD-10-CM | POA: Insufficient documentation

## 2023-05-27 DIAGNOSIS — I4892 Unspecified atrial flutter: Secondary | ICD-10-CM | POA: Diagnosis not present

## 2023-05-27 DIAGNOSIS — J45909 Unspecified asthma, uncomplicated: Secondary | ICD-10-CM | POA: Diagnosis not present

## 2023-05-27 HISTORY — PX: CARDIOVERSION: SHX1299

## 2023-05-27 HISTORY — DX: Type 2 diabetes mellitus without complications: E11.9

## 2023-05-27 LAB — GLUCOSE, CAPILLARY: Glucose-Capillary: 192 mg/dL — ABNORMAL HIGH (ref 70–99)

## 2023-05-27 SURGERY — CARDIOVERSION
Anesthesia: General

## 2023-05-27 MED ORDER — PROPOFOL 10 MG/ML IV BOLUS
INTRAVENOUS | Status: DC | PRN
  Administered 2023-05-27: 10 mg via INTRAVENOUS
  Administered 2023-05-27: 50 mg via INTRAVENOUS

## 2023-05-27 MED ORDER — LIDOCAINE 2% (20 MG/ML) 5 ML SYRINGE
INTRAMUSCULAR | Status: DC | PRN
  Administered 2023-05-27: 50 mg via INTRAVENOUS

## 2023-05-27 SURGICAL SUPPLY — 1 items: PAD DEFIB RADIO PHYSIO CONN (PAD) ×1 IMPLANT

## 2023-05-27 NOTE — CV Procedure (Signed)
    CARDIOVERSION NOTE  Procedure: Electrical Cardioversion Indications:  Atrial Fibrillation  Procedure Details:  Consent: Risks of procedure as well as the alternatives and risks of each were explained to the (patient/caregiver).  Consent for procedure obtained.  Time Out: Verified patient identification, verified procedure, site/side was marked, verified correct patient position, special equipment/implants available, medications/allergies/relevent history reviewed, required imaging and test results available.  Performed  Patient placed on cardiac monitor, pulse oximetry, supplemental oxygen as necessary.  Sedation given:  propofol per anesthesia Pacer pads placed anterior and posterior chest.  Cardioverted 1 time(s).  Cardioverted at 360J biphasic.  Impression: Findings: Post procedure EKG shows: NSR Complications: None Patient did tolerate procedure well.  Plan: Successful DCCV with a single 360J biphasic shock to NSR.  Time Spent Directly with the Patient:  30 minutes   Chrystie Nose, MD, [facility], FACP  [place]  [facility]  Medical Director of the [facility] Lipid Disorders &  Cardiovascular Risk Reduction Clinic Diplomate of the American Board of Clinical Lipidology Attending Cardiologist  Direct Dial: [phone]  Fax: [phone]  Website:  [email or web address] Nicely Anabelle Bungert 05/27/2023, 1:04 PM

## 2023-05-27 NOTE — Interval H&P Note (Signed)
History and Physical Interval Note:  05/27/2023 12:14 PM  James [name].  has presented today for surgery, with the diagnosis of AFIB.  The various methods of treatment have been discussed with the patient and family. After consideration of risks, benefits and other options for treatment, the patient has consented to  Procedure(s): CARDIOVERSION (N/A) as a surgical intervention.  The patient's history has been reviewed, patient examined, no change in status, stable for surgery.  I have reviewed the patient's chart and labs.  Questions were answered to the patient's satisfaction.     Chrystie Nose

## 2023-05-27 NOTE — Transfer of Care (Signed)
Immediate Anesthesia Transfer of Care Note  Patient: James [name].  Procedure(s) Performed: CARDIOVERSION  Patient Location: PACU and Cath Lab  Anesthesia Type:General  Level of Consciousness: awake  Airway & Oxygen Therapy: Patient Spontanous Breathing  Post-op Assessment: Report given to RN  Post vital signs: stable  Last Vitals:  Vitals Value Taken Time  BP    Temp    Pulse    Resp    SpO2      Last Pain:  Vitals:   05/27/23 1039  TempSrc:   PainSc: 0-No pain         Complications: No notable events documented.

## 2023-05-27 NOTE — Anesthesia Postprocedure Evaluation (Signed)
Anesthesia Post Note  Patient: James [name].  Procedure(s) Performed: CARDIOVERSION     Patient location during evaluation: PACU Anesthesia Type: General Level of consciousness: awake and alert and oriented Pain management: pain level controlled Vital Signs Assessment: post-procedure vital signs reviewed and stable Respiratory status: spontaneous breathing, nonlabored ventilation and respiratory function stable Cardiovascular status: blood pressure returned to baseline and stable Postop Assessment: no apparent nausea or vomiting Anesthetic complications: no   No notable events documented.  Last Vitals:  Vitals:   05/27/23 1009 05/27/23 1039  BP: 130/73   Pulse: 72 67  Resp: 14 13  Temp: 36.7 C   SpO2: 97% 97%    Last Pain:  Vitals:   05/27/23 1039  TempSrc:   PainSc: 0-No pain                 Shareka Casale A.

## 2023-05-27 NOTE — Anesthesia Preprocedure Evaluation (Signed)
Anesthesia Evaluation  Patient identified by MRN, date of birth, ID band Patient awake    Reviewed: Allergy & Precautions, NPO status , Patient's Chart, lab work & pertinent test results, reviewed documented beta blocker date and time   Airway Mallampati: III  TM Distance: >3 FB     Dental no notable dental hx. (+) Dental Advisory Given, Caps, Teeth Intact   Pulmonary asthma , sleep apnea and Continuous Positive Airway Pressure Ventilation , former smoker   Pulmonary exam normal breath sounds clear to auscultation       Cardiovascular hypertension, Pt. on medications and Pt. on home beta blockers +CHF   Rhythm:Irregular Rate:Normal     Neuro/Psych negative neurological ROS  negative psych ROS   GI/Hepatic negative GI ROS, Neg liver ROS,,,  Endo/Other  diabetes, Well Controlled, Type 2    Renal/GU Renal disease  negative genitourinary   Musculoskeletal negative musculoskeletal ROS (+)    Abdominal  (+) + obese  Peds  Hematology Xarelto therapy- last dose this am   Anesthesia Other Findings   Reproductive/Obstetrics                              Anesthesia Physical Anesthesia Plan  ASA: 3  Anesthesia Plan: General   Post-op Pain Management: Minimal or no pain anticipated   Induction: Intravenous  PONV Risk Score and Plan: 2 and Treatment may vary due to age or medical condition, Propofol infusion and TIVA  Airway Management Planned: Mask and Natural Airway  Additional Equipment: None  Intra-op Plan:   Post-operative Plan:   Informed Consent: I have reviewed the patients History and Physical, chart, labs and discussed the procedure including the risks, benefits and alternatives for the proposed anesthesia with the patient or authorized representative who has indicated his/her understanding and acceptance.     Dental advisory given  Plan Discussed with: Anesthesiologist and  CRNA  Anesthesia Plan Comments:          Anesthesia Quick Evaluation

## 2023-05-27 NOTE — Discharge Instructions (Signed)

## 2023-05-28 ENCOUNTER — Encounter (HOSPITAL_COMMUNITY): Payer: Self-pay | Admitting: Internal Medicine

## 2023-06-12 ENCOUNTER — Ambulatory Visit (HOSPITAL_COMMUNITY)
Admission: RE | Admit: 2023-06-12 | Discharge: 2023-06-12 | Disposition: A | Discharge status: Home / Self Care | Payer: BC Managed Care – PPO | Source: Ambulatory Visit | Attending: Internal Medicine | Admitting: Internal Medicine

## 2023-06-12 VITALS — BP 142/64 | HR 66 | Ht 70.0 in | Wt 268.0 lb

## 2023-06-12 DIAGNOSIS — Z7901 Long term (current) use of anticoagulants: Secondary | ICD-10-CM | POA: Insufficient documentation

## 2023-06-12 DIAGNOSIS — I428 Other cardiomyopathies: Secondary | ICD-10-CM | POA: Diagnosis present

## 2023-06-12 DIAGNOSIS — Z87891 Personal history of nicotine dependence: Secondary | ICD-10-CM | POA: Diagnosis not present

## 2023-06-12 DIAGNOSIS — I4891 Unspecified atrial fibrillation: Secondary | ICD-10-CM | POA: Diagnosis not present

## 2023-06-12 DIAGNOSIS — R9431 Abnormal electrocardiogram [ECG] [EKG]: Secondary | ICD-10-CM | POA: Insufficient documentation

## 2023-06-12 DIAGNOSIS — I10 Essential (primary) hypertension: Secondary | ICD-10-CM | POA: Diagnosis not present

## 2023-06-12 DIAGNOSIS — I4811 Longstanding persistent atrial fibrillation: Secondary | ICD-10-CM | POA: Insufficient documentation

## 2023-06-12 DIAGNOSIS — D6869 Other thrombophilia: Secondary | ICD-10-CM | POA: Diagnosis not present

## 2023-06-12 DIAGNOSIS — J45909 Unspecified asthma, uncomplicated: Secondary | ICD-10-CM | POA: Insufficient documentation

## 2023-06-12 DIAGNOSIS — I4892 Unspecified atrial flutter: Secondary | ICD-10-CM | POA: Insufficient documentation

## 2023-06-12 DIAGNOSIS — G4733 Obstructive sleep apnea (adult) (pediatric): Secondary | ICD-10-CM | POA: Insufficient documentation

## 2023-06-12 DIAGNOSIS — Z6838 Body mass index (BMI) 38.0-38.9, adult: Secondary | ICD-10-CM | POA: Insufficient documentation

## 2023-06-12 NOTE — Progress Notes (Signed)
Primary Care Physician: Patient, No Pcp Per Primary Cardiologist: Dr. Antoine Poche Primary Electrophysiologist: Dr. Elberta Fortis Referring Physician: N/A   [name] is a 67 y.o. male with a history of tobacco use, hypertension, asthma, morbid obesity, nonischemic cardiomyopathy, obstructive sleep apnea, and longstanding persistent atrial fibrillation who presents for consultation in the [facility].  The patient was initially diagnosed with atrial fibrillation in 2017. He had TEE cardioversion at that time. He did not convert to sinus rhythm and was loaded on amiodarone. He did have a successful second attempt at cardioversion. He was found to have a dilated cardiomyopathy with ejection fraction 20 to 25%. He had a stress test that showed an inferior infarct with scarring and no ischemia. Follow-up echo showed an ejection fraction of 45% in 2019. January 2023 as ejection fraction was normal. Patient is on Xarelto for a CHADS2VASC score of 2.  On evaluation today, he was seen by Dr. Elberta Fortis on 09/14/22. He admitted to feeling fatigued and occasionally short of breath with all the walking he has to do at work. He was loaded on amiodarone and plan to perform DCCV. He canceled DCCV due to cost. His plan is to wait until August when he turns 38 and has Medicare. He still feels like he has been in atrial fibrillation all the time. He does admit to still feeling fatigued with the quantity of walking he has to do at his work. No chest pain. He has been compliant with amiodarone BID. He has been taking Xarelto daily and has no bleeding concerns. He is compliant with CPAP. He admits to only taking losartan in the morning and did not know he was supposed to take a half tablet in the afternoon.  On follow up 03/21/23, he is currently in atrial flutter. Patient does not have Medicare but would still like to go ahead and pursue DCCV. He missed a dose of Xarelto 2 days ago. Currently  taking amiodarone 200 mg daily.   On f/u 04/25/23, he is currently in atrial flutter. S/p amiodarone reload and successful DCCV on 04/12/23 with conversion to NSR x 3 shocks. He feels like he went out of rhythm about 5 days after cardioversion. He does note that he felt better when he was in normal rhythm. He is currently taking amiodarone 200 mg daily. No missed doses of Xarelto.   On f/u 05/17/23, he is currently in atrial flutter. He feels tired when out of rhythm. He began amiodarone reload at last visit to determine final time if able to maintain normal rhythm on amiodarone or if discussion required with EP on next steps. He is currently taking amiodarone 200 mg BID. No missed doses of Xarelto.   On follow up 06/12/23, he is currently in atrial flutter. S/p successful DCCV with 360J defibrillator on 05/27/23. Patient states he was in NSR for about a week and he felt good. He actually still feels okay now because he cannot really feel his atrial flutter.   Today, he denies symptoms of palpitations, chest pain, orthopnea, PND, lower extremity edema, dizziness, presyncope, syncope, snoring, daytime somnolence, bleeding, or neurologic sequela. The patient is tolerating medications without difficulties and is otherwise without complaint today.    Atrial Fibrillation Risk Factors:  he does have symptoms or diagnosis of sleep apnea. he is compliant with CPAP therapy. he does not have a history of rheumatic fever. he does not have a history of alcohol use. The patient does not have a  history of early familial atrial fibrillation or other arrhythmias.  he has a BMI of Body mass index is 38.45 kg/m.Marland Kitchen Filed Weights   06/12/23 0939  Weight: 121.6 kg     Family History  Problem Relation Age of Onset   CAD Mother        CABG age 60   CAD Maternal Aunt        CABG age 40    Atrial Fibrillation Management history:  Previous antiarrhythmic drugs: amiodarone Previous cardioversions: 2017 x 2,  04/12/23, 05/27/23 Previous ablations: None CHADS2VASC score: 2 Anticoagulation history: Xarelto   Past Medical History:  Diagnosis Date   Asthma    Diabetes mellitus without complication (HCC)    Former tobacco use    Hypertension    Morbid obesity (HCC)    New onset atrial fibrillation (HCC) 07/31/2015   Non-ischemic cardiomyopathy (HCC)    Suspected sleep apnea    Past Surgical History:  Procedure Laterality Date   CARDIOVERSION N/A 08/08/2015   Procedure: CARDIOVERSION;  Surgeon: Lars Masson, MD;  Location: [facility] ENDOSCOPY;  Service: Cardiovascular;  Laterality: N/A;   CARDIOVERSION N/A 08/12/2015   Procedure: CARDIOVERSION;  Surgeon: Chilton Si, MD;  Location: [facility] ENDOSCOPY;  Service: Cardiovascular;  Laterality: N/A;   CARDIOVERSION N/A 04/12/2023   Procedure: CARDIOVERSION;  Surgeon: Thurmon Fair, MD;  Location: [facility] INVASIVE CV LAB;  Service: Cardiovascular;  Laterality: N/A;   CARDIOVERSION N/A 05/27/2023   Procedure: CARDIOVERSION;  Surgeon: Chrystie Nose, MD;  Location: [facility] INVASIVE CV LAB;  Service: Cardiovascular;  Laterality: N/A;   MOUTH SURGERY     TEE WITHOUT CARDIOVERSION N/A 08/08/2015   Procedure: TRANSESOPHAGEAL ECHOCARDIOGRAM (TEE);  Surgeon: Lars Masson, MD;  Location: [facility] ENDOSCOPY;  Service: Cardiovascular;  Laterality: N/A;    Current Outpatient Medications  Medication Sig Dispense Refill   amiodarone (PACERONE) 200 MG tablet Take 1 tablet (200 mg total) by mouth 2 (two) times daily. 60 tablet 0   carvedilol (COREG) 25 MG tablet TAKE 1 TABLETS BY MOUTH IN THE MORNING AND TAKE HALF TABLET BY MOUTH IN THE AFTERNOON 270 tablet 3   losartan (COZAAR) 50 MG tablet TAKE 1 TABLET BY MOUTH IN THE MORNING AND  ONE-HALF  TABLET  IN  THE  AFTERNOON 135 tablet 3   Magnesium 500 MG CAPS Take 500 mg by mouth daily.     Omega-3 Fatty Acids (FISH OIL PO) Take 2,000 mg by mouth daily.     rivaroxaban (XARELTO) 20 MG TABS tablet Take 1 tablet (20 mg total) by  mouth daily with supper. 90 tablet 1   spironolactone (ALDACTONE) 25 MG tablet Take 1 tablet (25 mg total) by mouth daily. 90 tablet 3   No current facility-administered medications for this encounter.    Allergies  Allergen Reactions   Penicillins Other (See Comments)    Childhood    ROS- All systems are reviewed and negative except as per the HPI above.  Physical Exam: Vitals:   06/12/23 0939  BP: (!) 142/64  Pulse: 66  Weight: 121.6 kg  Height: 5\' 10"  (1.778 m)    GEN- The patient is well appearing, alert and oriented x 3 today.   Neck - no JVD or carotid bruit noted Lungs- Clear to ausculation bilaterally, normal work of breathing Heart- Irregular rate and rhythm, no murmurs, rubs or gallops, PMI not laterally displaced Extremities- no clubbing, cyanosis, or edema Skin - no rash or ecchymosis noted   Wt Readings from Last 3 Encounters:  06/12/23 121.6 kg  05/27/23 124 kg  05/17/23 124 kg    EKG today demonstrates Vent. rate 66 BPM PR interval * ms QRS duration 90 ms QT/QTcB 446/467 ms P-R-T axes * 67 31 Atrial flutter with variable A-V block Indeterminate axis Abnormal ECG When compared with ECG of 17-May-2023 10:53, PREVIOUS ECG IS PRESENT  Echo 08/21/21 demonstrated: 1. Left ventricular ejection fraction, by estimation, is 55 to 60%. The  left ventricle has normal function. The left ventricle has no regional  wall motion abnormalities. Left ventricular diastolic parameters are  indeterminate.   2. Right ventricular systolic function is normal. The right ventricular  size is normal. There is normal pulmonary artery systolic pressure.   3. The mitral valve is normal in structure. Mild mitral valve  regurgitation.   4. The aortic valve is tricuspid. Aortic valve regurgitation is not  visualized. Aortic valve sclerosis is present, with no evidence of aortic  valve stenosis.   5. The inferior vena cava is dilated in size with <50% respiratory   variability, suggesting right atrial pressure of 15 mmHg.    Epic records are reviewed at length today  CHA2DS2-VASc Score = 2  The patient's score is based upon: CHF History: 1 HTN History: 1 Diabetes History: 0 Stroke History: 0 Vascular Disease History: 0 Age Score: 0 Gender Score: 0      ASSESSMENT AND PLAN: Longstanding Persistent Atrial Fibrillation (ICD10:  I48.11) / Atrial flutter The patient's CHA2DS2-VASc score is 2, indicating a 2.2% annual risk of stroke.   S/p successful DCCV on 04/12/23 x 3 shocks.  S/p successful DCCV with 360J defibrillator on 05/27/23.   He is currently in atrial flutter. He was in NSR for about a week after 360J cardioversion. I would like for him to speak with Dr. Elberta Fortis regarding possibility of ablation. Continue amiodarone 200 mg daily.   2. Secondary hypercoagulable state secondary to atrial fibrillation Currently on Xarelto daily; continue without change. No bleeding concerns. No missed doses.   Follow up with Dr. Elberta Fortis.     Lake Bells, PA-C [facility] [address] [place], [place] [address] [phone] 06/12/2023 10:05 AM

## 2023-07-17 NOTE — Progress Notes (Unsigned)
Cardiology Office Note:   Date:  07/18/2023  ID:  James Quint., DOB 06-12-1956, MRN [id number] PCP: Patient, No Pcp Per  [facility] Providers Cardiologist:  Rollene Rotunda, MD Electrophysiologist:  Will Jorja Loa, MD  Sleep Medicine:  Armanda Magic, MD {  History of Present Illness:   James [name]. is a 67 y.o. male who presents for evaluation of atrial fib.   He was in the office in 2020.  with shortness of breath.  In 2017 he had atrial fibrillation. He subsequently needed TEE guided cardioversion. However, he did not convert to sinus rhythm with this and was treated with amiodarone. His second attempt at TEE cardioversion following this was successful. He has a dilated cardiomyopathy with an EF of 20-25%. Stress testing demonstrated an inferior wall defect which may have been artifact or prior inferior infarct with scarring. However, there was no ischemia.  The EF on follow up in July 2017 was back to normal.  It was 45% on echo in 2019.  He has been treated with amiodarone and DCCV but converted back to flutter.   He has follow up with EP in January.    He since I last saw him he has had no new cardiovascular complaints.  He feels his atrial fibrillation does not have as much energy with that but he is not having any presyncope or syncope.  He is really not describing tachypalpitations.  He is not having any new shortness of breath, PND or orthopnea.  He has no palpitations, presyncope or syncope.  He sleeps in a recliner out of habit.   ROS: As stated in the HPI and negative for all other systems.  Studies Reviewed:    EKG:   NA  Risk Assessment/Calculations:    CHA2DS2-VASc Score = 2   This indicates a 2.2% annual risk of stroke. The patient's score is based upon: CHF History: 1 HTN History: 1 Diabetes History: 0 Stroke History: 0 Vascular Disease History: 0 Age Score: 0 Gender Score: 0   Physical Exam:   VS:  BP 115/73 (BP Location: Left Arm,  Patient Position: Sitting, Cuff Size: Normal)   Pulse 85   Ht 5\' 10"  (1.778 m)   Wt 268 lb 12.8 oz (121.9 kg)   SpO2 96%   BMI 38.57 kg/m    Wt Readings from Last 3 Encounters:  07/18/23 268 lb 12.8 oz (121.9 kg)  06/12/23 268 lb (121.6 kg)  05/27/23 273 lb 5.9 oz (124 kg)     GEN: Well nourished, well developed in no acute distress NECK: No JVD; No carotid bruits CARDIAC: Irregular RR, no murmurs, rubs, gallops RESPIRATORY:  Clear to auscultation without rales, wheezing or rhonchi  ABDOMEN: Soft, non-tender, non-distended EXTREMITIES:  No edema; No deformity   ASSESSMENT AND PLAN:   DILATED CARDIOMYOPATHY:    EF was normal in January 2023.  I would not suspect this to be lower.  No change in therapy.   ATRIAL FIB:    [name] has a CHA2DS2 - VASc score of 2 .  He is going to be considered for ablation when he sees Dr. Elberta Fortis next month.  He tolerates anticoagulation and has good rate control.  He is up-to-date with blood work.  No change in therapy.   HTN: His blood pressure is at target.  No change in therapy.   SLEEP APNEA:   He is compliant with the CPAP.    OBESITY:   I have encouraged  more weight loss.  I am proud of his weight loss and encouraged more the same.       Follow up with me in six months.   Signed, Rollene Rotunda, MD

## 2023-07-18 ENCOUNTER — Encounter: Payer: Self-pay | Admitting: Cardiology

## 2023-07-18 ENCOUNTER — Ambulatory Visit: Payer: BC Managed Care – PPO | Attending: Cardiology | Admitting: Cardiology

## 2023-07-18 VITALS — BP 115/73 | HR 85 | Ht 70.0 in | Wt 268.8 lb

## 2023-07-18 DIAGNOSIS — I10 Essential (primary) hypertension: Secondary | ICD-10-CM | POA: Diagnosis not present

## 2023-07-18 DIAGNOSIS — I420 Dilated cardiomyopathy: Secondary | ICD-10-CM

## 2023-07-18 DIAGNOSIS — G4730 Sleep apnea, unspecified: Secondary | ICD-10-CM

## 2023-07-18 DIAGNOSIS — I4811 Longstanding persistent atrial fibrillation: Secondary | ICD-10-CM

## 2023-07-18 NOTE — Patient Instructions (Signed)
Medication Instructions:  No changes *If you need a refill on your cardiac medications before your next appointment, please call your pharmacy*  Follow-Up: At [facility], you and your health needs are our priority.  As part of our continuing mission to provide you with exceptional heart care, we have created designated Provider Care Teams.  These Care Teams include your primary Cardiologist (physician) and Advanced Practice Providers (APPs -  Physician Assistants and Nurse Practitioners) who all work together to provide you with the care you need, when you need it.  We recommend signing up for the patient portal called "MyChart".  Sign up information is provided on this After Visit Summary.  MyChart is used to connect with patients for Virtual Visits (Telemedicine).  Patients are able to view lab/test results, encounter notes, upcoming appointments, etc.  Non-urgent messages can be sent to your provider as well.   To learn more about what you can do with MyChart, go to [email or web address].    Your next appointment:   6 month(s)  Provider:   Minus Breeding, MD

## 2023-08-13 ENCOUNTER — Ambulatory Visit: Payer: BC Managed Care – PPO | Admitting: Cardiology

## 2023-09-04 ENCOUNTER — Other Ambulatory Visit: Payer: Self-pay | Admitting: Cardiology

## 2023-09-04 DIAGNOSIS — I480 Paroxysmal atrial fibrillation: Secondary | ICD-10-CM

## 2023-09-04 NOTE — Telephone Encounter (Signed)
Prescription refill request for Xarelto received.  Indication:afib Last office visit:12/24 Weight:121.9  kg Age:68 Scr:1.18  10/24 CrCl:107.61  ml/min  Prescription refilled

## 2023-09-09 ENCOUNTER — Encounter: Payer: Self-pay | Admitting: Cardiology

## 2023-09-09 ENCOUNTER — Ambulatory Visit: Payer: BC Managed Care – PPO | Attending: Cardiology | Admitting: Cardiology

## 2023-09-09 VITALS — BP 130/88 | HR 71 | Ht 69.5 in | Wt 274.2 lb

## 2023-09-09 DIAGNOSIS — G4733 Obstructive sleep apnea (adult) (pediatric): Secondary | ICD-10-CM | POA: Diagnosis not present

## 2023-09-09 DIAGNOSIS — I4811 Longstanding persistent atrial fibrillation: Secondary | ICD-10-CM

## 2023-09-09 DIAGNOSIS — D6869 Other thrombophilia: Secondary | ICD-10-CM

## 2023-09-09 DIAGNOSIS — I10 Essential (primary) hypertension: Secondary | ICD-10-CM

## 2023-09-09 DIAGNOSIS — Z01812 Encounter for preprocedural laboratory examination: Secondary | ICD-10-CM

## 2023-09-09 DIAGNOSIS — Z79899 Other long term (current) drug therapy: Secondary | ICD-10-CM

## 2023-09-09 NOTE — Progress Notes (Signed)
 Electrophysiology Office Note:   Date:  09/09/2023  ID:  James Arabia., DOB May 01, 1956, MRN [id number]  Primary Cardiologist: Eilleen Grates, MD Primary Heart Failure: None Electrophysiologist: Dontavion Noxon Cortland Ding, MD      History of Present Illness:   James Kump. is a 68 y.o. male with h/o pretension, morbid obesity, atrial fibrillation, chronic systolic heart failure due to nonischemic cardiomyopathy seen today for routine electrophysiology followup.   His cardiomyopathy was discovered when he was diagnosed with atrial fibrillation.  Since then, with maintenance of sinus rhythm, his ejection fraction has normalized.  Since last being seen in our clinic the patient reports doing overall well.  He can do his daily activities, but does have to take breaks.  He gets at times short of breath with weakness and fatigue.  There are other times where he feels well without major complaint.  He is interested in ablation for rhythm control.  he denies chest pain, palpitations, dyspnea, PND, orthopnea, nausea, vomiting, dizziness, syncope, edema, weight gain, or early satiety.   Review of systems complete and found to be negative unless listed in HPI.   EP Information / Studies Reviewed:    EKG is ordered today. Personal review as below.  EKG Interpretation Date/Time:  Monday September 09 2023 09:30:07 EST Ventricular Rate:  71 PR Interval:    QRS Duration:  90 QT Interval:  436 QTC Calculation: 473 R Axis:   7  Text Interpretation: Atrial fibrillation Abnormal ECG When compared with ECG of 12-Jun-2023 09:46, Atrial fibrillation has replaced Atrial flutter Confirmed by Nejla Reasor ([id number]) on 09/09/2023 9:35:03 AM     Risk Assessment/Calculations:    CHA2DS2-VASc Score = 2   This indicates a 2.2% annual risk of stroke. The patient's score is based upon: CHF History: 1 HTN History: 1 Diabetes History: 0 Stroke History: 0 Vascular Disease History: 0 Age Score: 0 Gender  Score: 0             Physical Exam:   VS:  BP 130/88   Pulse 71   Ht 5' 9.5" (1.765 m)   Wt 274 lb 3.2 oz (124.4 kg)   SpO2 99%   BMI 39.91 kg/m    Wt Readings from Last 3 Encounters:  09/09/23 274 lb 3.2 oz (124.4 kg)  07/18/23 268 lb 12.8 oz (121.9 kg)  06/12/23 268 lb (121.6 kg)     GEN: Well nourished, well developed in no acute distress NECK: No JVD; No carotid bruits CARDIAC: Irregularly irregular rate and rhythm, no murmurs, rubs, gallops RESPIRATORY:  Clear to auscultation without rales, wheezing or rhonchi  ABDOMEN: Soft, non-tender, non-distended EXTREMITIES:  No edema; No deformity   ASSESSMENT AND PLAN:    1.  Longstanding persistent atrial fibrillation: Currently on carvedilol .  He is feeling poorly with fatigue and shortness of breath, likely due to his atrial fibrillation.  He is on amiodarone  200 mg daily which is not controlling his arrhythmia.  He would benefit from ablation.  Risks and benefits have been discussed.  He understands the risks and is agreed to the procedure.  Risk, benefits, and alternatives to EP study and radiofrequency/pulse field ablation for afib were also discussed in detail today. These risks include but are not limited to stroke, bleeding, vascular damage, tamponade, perforation, damage to the esophagus, lungs, and other structures, pulmonary vein stenosis, worsening renal function, and death. The patient understands these risk and wishes to proceed.  We Rolena Knutson therefore proceed with catheter ablation at  the next available time.  Carto, ICE, anesthesia are requested for the procedure.  Latron Ribas also obtain CT PV protocol prior to the procedure to exclude LAA thrombus and further evaluate atrial anatomy.  2.  Secondary hypercoagulable state: Currently on Xarelto  20 mg daily for atrial fibrillation  3.  Hypertension: Currently well-controlled  4.  Obstructive sleep apnea: CPAP compliance encouraged  5.  Obesity: Lifestyle modification  encouraged  6.  Dilated cardiomyopathy: Ejection fraction is since normalized  Follow up with Dr. Lawana Pray as usual post procedure  Signed, Anyjah Roundtree Cortland Ding, MD

## 2023-09-09 NOTE — Patient Instructions (Signed)
 Medication Instructions:  Your physician recommends that you continue on your current medications as directed. Please refer to the Current Medication list given to you today.  *If you need a refill on your cardiac medications before your next appointment, please call your pharmacy*   Lab Work: Pre procedure labs -- we will call you to schedule:  BMP & CBC  If you have a lab test that is abnormal and we need to change your treatment, we will call you to review the results -- otherwise no news is good news.    Testing/Procedures: Your physician has requested that you have cardiac CT 1 month PRIOR to your ablation. Cardiac computed tomography (CT) is a painless test that uses an x-ray machine to take clear, detailed pictures of your heart. We will contact you if the result is abnormal. We will call you to schedule.  Your physician has recommended that you have an ablation. Catheter ablation is a medical procedure used to treat some cardiac arrhythmias (irregular heartbeats). During catheter ablation, a long, thin, flexible tube is put into a blood vessel in your groin (upper thigh), or neck. This tube is called an ablation catheter. It is then guided to your heart through the blood vessel. Radio frequency waves destroy small areas of heart tissue where abnormal heartbeats may cause an arrhythmia to start.   Your ablation is scheduled for 10/25/2023. Please arrive at [facility] at 5:30 am.  We will call you for further instructions.   Follow-Up: At [facility], you and your health needs are our priority.  As part of our continuing mission to provide you with exceptional heart care, we have created designated Provider Care Teams.  These Care Teams include your primary Cardiologist (physician) and Advanced Practice Providers (APPs -  Physician Assistants and Nurse Practitioners) who all work together to provide you with the care you need, when you need it.  Your next appointment:   1  month(s) after your ablation  The format for your next appointment:   In Person  Provider:   AFib clinic   Thank you for choosing [facility]!!   Reece Cane, RN [phone]    Other Instructions   Cardiac Ablation Cardiac ablation is a procedure to destroy (ablate) some heart tissue that is sending bad signals. These bad signals cause problems in heart rhythm. The heart has many areas that make these signals. If there are problems in these areas, they can make the heart beat in a way that is not normal. Destroying some tissues can help make the heart rhythm normal. Tell your doctor about: Any allergies you have. All medicines you are taking. These include vitamins, herbs, eye drops, creams, and over-the-counter medicines. Any problems you or family members have had with medicines that make you fall asleep (anesthetics). Any blood disorders you have. Any surgeries you have had. Any medical conditions you have, such as kidney failure. Whether you are pregnant or may be pregnant. What are the risks? This is a safe procedure. But problems may occur, including: Infection. Bruising and bleeding. Bleeding into the chest. Stroke or blood clots. Damage to nearby areas of your body. Allergies to medicines or dyes. The need for a pacemaker if the normal system is damaged. Failure of the procedure to treat the problem. What happens before the procedure? Medicines Ask your doctor about: Changing or stopping your normal medicines. This is important. Taking aspirin and ibuprofen. Do not take these medicines unless your doctor tells you to  take them. Taking other medicines, vitamins, herbs, and supplements. General instructions Follow instructions from your doctor about what you cannot eat or drink. Plan to have someone take you home from the hospital or clinic. If you will be going home right after the procedure, plan to have someone with you for 24 hours. Ask your doctor  what steps will be taken to prevent infection. What happens during the procedure?  An IV tube will be put into one of your veins. You will be given a medicine to help you relax. The skin on your neck or groin will be numbed. A cut (incision) will be made in your neck or groin. A needle will be put through your cut and into a large vein. A tube (catheter) will be put into the needle. The tube will be moved to your heart. Dye may be put through the tube. This helps your doctor see your heart. Small devices (electrodes) on the tube will send out signals. A type of energy will be used to destroy some heart tissue. The tube will be taken out. Pressure will be held on your cut. This helps stop bleeding. A bandage will be put over your cut. The exact procedure may vary among doctors and hospitals. What happens after the procedure? You will be watched until you leave the hospital or clinic. This includes checking your heart rate, breathing rate, oxygen, and blood pressure. Your cut will be watched for bleeding. You will need to lie still for a few hours. Do not drive for 24 hours or as long as your doctor tells you. Summary Cardiac ablation is a procedure to destroy some heart tissue. This is done to treat heart rhythm problems. Tell your doctor about any medical conditions you may have. Tell him or her about all medicines you are taking to treat them. This is a safe procedure. But problems may occur. These include infection, bruising, bleeding, and damage to nearby areas of your body. Follow what your doctor tells you about food and drink. You may also be told to change or stop some of your medicines. After the procedure, do not drive for 24 hours or as long as your doctor tells you. This information is not intended to replace advice given to you by your health care provider. Make sure you discuss any questions you have with your health care provider. Document Revised: 10/06/2021 Document  Reviewed: 06/18/2019 Elsevier Patient Education  2023 [employer]   Cardiac Ablation, Care After  This sheet gives you information about how to care for yourself after your procedure. Your health care provider may also give you more specific instructions. If you have problems or questions, contact your health care provider. What can I expect after the procedure? After the procedure, it is common to have: Bruising around your puncture site. Tenderness around your puncture site. Skipped heartbeats. If you had an atrial fibrillation ablation, you may have atrial fibrillation during the first several months after your procedure.  Tiredness (fatigue).  Follow these instructions at home: Puncture site care  Follow instructions from your health care provider about how to take care of your puncture site. Make sure you: If present, leave stitches (sutures), skin glue, or adhesive strips in place. These skin closures may need to stay in place for up to 2 weeks. If adhesive strip edges start to loosen and curl up, you may trim the loose edges. Do not remove adhesive strips completely unless your health care provider tells you to do that.  If a large square bandage is present, this may be removed 24 hours after surgery.  Check your puncture site every day for signs of infection. Check for: Redness, swelling, or pain. Fluid or blood. If your puncture site starts to bleed, lie down on your back, apply firm pressure to the area, and contact your health care provider. Warmth. Pus or a bad smell. A pea or small marble sized lump at the site is normal and can take up to three months to resolve.  Driving Do not drive for at least 4 days after your procedure or however long your health care provider recommends. (Do not resume driving if you have previously been instructed not to drive for other health reasons.) Do not drive or use heavy machinery while taking prescription pain medicine. Activity Avoid  activities that take a lot of effort for at least 7 days after your procedure. Do not lift anything that is heavier than 5 lb (4.5 kg) for one week.  No sexual activity for 1 week.  Return to your normal activities as told by your health care provider. Ask your health care provider what activities are safe for you. General instructions Take over-the-counter and prescription medicines only as told by your health care provider. Do not use any products that contain nicotine or tobacco, such as cigarettes and e-cigarettes. If you need help quitting, ask your health care provider. You may shower after 24 hours, but Do not take baths, swim, or use a hot tub for 1 week.  Do not drink alcohol for 24 hours after your procedure. Keep all follow-up visits as told by your health care provider. This is important. Contact a health care provider if: You have redness, mild swelling, or pain around your puncture site. You have fluid or blood coming from your puncture site that stops after applying firm pressure to the area. Your puncture site feels warm to the touch. You have pus or a bad smell coming from your puncture site. You have a fever. You have chest pain or discomfort that spreads to your neck, jaw, or arm. You have chest pain that is worse with lying on your back or taking a deep breath. You are sweating a lot. You feel nauseous. You have a fast or irregular heartbeat. You have shortness of breath. You are dizzy or light-headed and feel the need to lie down. You have pain or numbness in the arm or leg closest to your puncture site. Get help right away if: Your puncture site suddenly swells. Your puncture site is bleeding and the bleeding does not stop after applying firm pressure to the area. These symptoms may represent a serious problem that is an emergency. Do not wait to see if the symptoms will go away. Get medical help right away. Call your local emergency services (911 in the [place] Do not  drive yourself to the hospital. Summary After the procedure, it is normal to have bruising and tenderness at the puncture site in your groin, neck, or forearm. Check your puncture site every day for signs of infection. Get help right away if your puncture site is bleeding and the bleeding does not stop after applying firm pressure to the area. This is a medical emergency. This information is not intended to replace advice given to you by your health care provider. Make sure you discuss any questions you have with your health care provider.

## 2023-09-27 ENCOUNTER — Telehealth (HOSPITAL_COMMUNITY): Payer: Self-pay

## 2023-09-27 NOTE — Telephone Encounter (Signed)
 Attempted to reach patient to discuss upcoming procedure, no answer. Left VM for patient to return call.

## 2023-09-30 NOTE — Addendum Note (Signed)
 Addended by: Primitivo Gauze on: 09/30/2023 09:26 AM   Modules accepted: Orders

## 2023-09-30 NOTE — Telephone Encounter (Signed)
 Per Dr. Elberta Fortis, patient should get PCP to have this addressed. Cannot access femoral vein if rash is present.   Attempted to reach patient. Left VM for patient to return call.

## 2023-09-30 NOTE — Telephone Encounter (Addendum)
 Spoke with patient to complete one month pre-procedure call.     New medical conditions?  Denies any new medical conditions but reports he has been treating himself for jock itch in the groin area for at least 20 years with Miconazole spray, which can sometimes lead to a severe rash, odor and discomfort. Currently he describes the area as itchy with no other symptoms present. He does not have a PCP and denies ever having area evaluated by a doctor.  Recent hospitalizations or surgeries? No Started any new medications? No Patient made aware to contact office to inform of any new medications started. Any changes in activities of daily living? No  Pre-procedure testing scheduled: CT on 10/04/23 and lab work ordered Confirmed patient is taking Xarelto and will continue taking medication before procedure or it may need to be rescheduled.  Confirmed patient is scheduled for Atrial Fibrillation Ablation on Friday, March 28 with Dr. Loman Brooklyn. Instructed patient to arrive at the Main Entrance A at [facility]: [address] [place], [place] [address] and check in at Admitting at 5:30 AM  Advised of plan to go home the same day and will only stay overnight if medically necessary. You MUST have a responsible adult to drive you home and MUST be with you the first 24 hours after you arrive home or your procedure could be cancelled.  Patient verbalized understanding to information provided and is agreeable to proceed with procedure.

## 2023-10-03 LAB — COMPREHENSIVE METABOLIC PANEL
ALT: 26 IU/L (ref 0–44)
AST: 24 IU/L (ref 0–40)
Albumin: 4.5 g/dL (ref 3.9–4.9)
Alkaline Phosphatase: 58 IU/L (ref 44–121)
BUN/Creatinine Ratio: 10 (ref 10–24)
BUN: 15 mg/dL (ref 8–27)
Bilirubin Total: 0.9 mg/dL (ref 0.0–1.2)
CO2: 19 mmol/L — ABNORMAL LOW (ref 20–29)
Calcium: 9.5 mg/dL (ref 8.6–10.2)
Chloride: 98 mmol/L (ref 96–106)
Creatinine, Ser: 1.46 mg/dL — ABNORMAL HIGH (ref 0.76–1.27)
Globulin, Total: 2.5 g/dL (ref 1.5–4.5)
Glucose: 151 mg/dL — ABNORMAL HIGH (ref 70–99)
Potassium: 4.8 mmol/L (ref 3.5–5.2)
Sodium: 134 mmol/L (ref 134–144)
Total Protein: 7 g/dL (ref 6.0–8.5)
eGFR: 53 mL/min/{1.73_m2} — ABNORMAL LOW (ref >59)

## 2023-10-03 LAB — CBC
Hematocrit: 47.1 % (ref 37.5–51.0)
Hemoglobin: 15.9 g/dL (ref 13.0–17.7)
MCH: 31.1 pg (ref 26.6–33.0)
MCHC: 33.8 g/dL (ref 31.5–35.7)
MCV: 92 fL (ref 79–97)
Platelets: 185 10*3/uL (ref 150–450)
RBC: 5.12 x10E6/uL (ref 4.14–5.80)
RDW: 12.9 % (ref 11.6–15.4)
WBC: 6.4 10*3/uL (ref 3.4–10.8)

## 2023-10-03 LAB — TSH: TSH: 1.6 u[IU]/mL (ref 0.450–4.500)

## 2023-10-04 ENCOUNTER — Ambulatory Visit (HOSPITAL_COMMUNITY)
Admission: RE | Admit: 2023-10-04 | Discharge: 2023-10-04 | Disposition: A | Discharge status: Home / Self Care | Payer: BC Managed Care – PPO | Source: Ambulatory Visit | Attending: Cardiology | Admitting: Cardiology

## 2023-10-04 DIAGNOSIS — I4811 Longstanding persistent atrial fibrillation: Secondary | ICD-10-CM | POA: Diagnosis present

## 2023-10-04 MED ORDER — IOHEXOL 350 MG/ML SOLN
95.0000 mL | Freq: Once | INTRAVENOUS | Status: AC | PRN
  Administered 2023-10-04: 95 mL via INTRAVENOUS

## 2023-10-10 ENCOUNTER — Telehealth (HOSPITAL_COMMUNITY): Payer: Self-pay

## 2023-10-10 NOTE — Telephone Encounter (Signed)
 Call placed to patient to discuss upcoming procedure.   CT: completed.  Labs: completed.   Any recent signs of acute illness or been started on antibiotics? No. Patient reports he has been unable to get established with a PCP as of yet. He denies having a rash at either groin areas currently. Will update Dr. Elberta Fortis Any new medications started? No Any medications to hold? No Any missed doses of blood thinner? No Advised patient to continue taking ANTICOAGULANT: Xarelto (Rivaroxaban) without missing any doses.  Medication instructions:  On the morning of your procedure DO NOT take any medication., including Xarelto or the procedure may be rescheduled. Nothing to eat or drink after midnight prior to your procedure.  Confirmed patient is scheduled for Atrial Fibrillation Ablation on Thursday, March 20 with Dr. Loman Brooklyn. Instructed patient to arrive at the Main Entrance A at [facility]: [address] [place], [place] [address] and check in at Admitting at 7:30 AM  Advised of plan to go home the same day and will only stay overnight if medically necessary. You MUST have a responsible adult to drive you home and MUST be with you the first 24 hours after you arrive home or your procedure could be cancelled.  Patient verbalized understanding to all instructions provided and agreed to proceed with procedure.

## 2023-10-10 NOTE — Telephone Encounter (Signed)
 Per Dr. Elberta Fortis, if rash returns should treat with otc meds otherwise ok proceeding with ablation. Informed patient of this information. He verbalized understanding.

## 2023-10-16 NOTE — Pre-Procedure Instructions (Signed)
 Instructed patient on the following items: Arrival time 0730 Nothing to eat or drink after midnight No meds AM of procedure Responsible person to drive you home and stay with you for 24 hrs  Have you missed any doses of anti-coagulant Xarelto, takes once a day, hasn't Missed any doses.  Don't take dose in the morning.

## 2023-10-17 ENCOUNTER — Ambulatory Visit (HOSPITAL_COMMUNITY): Admitting: Anesthesiology

## 2023-10-17 ENCOUNTER — Other Ambulatory Visit: Payer: Self-pay

## 2023-10-17 ENCOUNTER — Ambulatory Visit (HOSPITAL_COMMUNITY)
Admission: RE | Disposition: A | Discharge status: Home / Self Care | Payer: Self-pay | Source: Home / Self Care | Attending: Cardiology

## 2023-10-17 ENCOUNTER — Ambulatory Visit (HOSPITAL_COMMUNITY)
Admission: RE | Admit: 2023-10-17 | Discharge: 2023-10-17 | Disposition: A | Discharge status: Home / Self Care | Payer: BC Managed Care – PPO | Attending: Cardiology | Admitting: Cardiology

## 2023-10-17 DIAGNOSIS — I428 Other cardiomyopathies: Secondary | ICD-10-CM | POA: Diagnosis not present

## 2023-10-17 DIAGNOSIS — J45909 Unspecified asthma, uncomplicated: Secondary | ICD-10-CM | POA: Insufficient documentation

## 2023-10-17 DIAGNOSIS — N189 Chronic kidney disease, unspecified: Secondary | ICD-10-CM | POA: Diagnosis not present

## 2023-10-17 DIAGNOSIS — E1122 Type 2 diabetes mellitus with diabetic chronic kidney disease: Secondary | ICD-10-CM | POA: Diagnosis not present

## 2023-10-17 DIAGNOSIS — I483 Typical atrial flutter: Secondary | ICD-10-CM | POA: Insufficient documentation

## 2023-10-17 DIAGNOSIS — Z87891 Personal history of nicotine dependence: Secondary | ICD-10-CM | POA: Insufficient documentation

## 2023-10-17 DIAGNOSIS — I130 Hypertensive heart and chronic kidney disease with heart failure and stage 1 through stage 4 chronic kidney disease, or unspecified chronic kidney disease: Secondary | ICD-10-CM | POA: Insufficient documentation

## 2023-10-17 DIAGNOSIS — I5022 Chronic systolic (congestive) heart failure: Secondary | ICD-10-CM | POA: Diagnosis not present

## 2023-10-17 DIAGNOSIS — Z7901 Long term (current) use of anticoagulants: Secondary | ICD-10-CM | POA: Diagnosis not present

## 2023-10-17 DIAGNOSIS — I4819 Other persistent atrial fibrillation: Secondary | ICD-10-CM | POA: Insufficient documentation

## 2023-10-17 HISTORY — PX: ATRIAL FIBRILLATION ABLATION: EP1191

## 2023-10-17 LAB — GLUCOSE, CAPILLARY
Glucose-Capillary: 249 mg/dL — ABNORMAL HIGH (ref 70–99)
Glucose-Capillary: 226 mg/dL — ABNORMAL HIGH (ref 70–99)

## 2023-10-17 LAB — POCT ACTIVATED CLOTTING TIME: Activated Clotting Time: 326 s

## 2023-10-17 SURGERY — ATRIAL FIBRILLATION ABLATION
Anesthesia: General

## 2023-10-17 MED ORDER — ONDANSETRON HCL 4 MG/2ML IJ SOLN: 4.0000 mg | Freq: Four times a day (QID) | INTRAMUSCULAR | Status: DC | PRN

## 2023-10-17 MED ORDER — PHENYLEPHRINE HCL-NACL 20-0.9 MG/250ML-% IV SOLN
INTRAVENOUS | Status: DC | PRN
  Administered 2023-10-17: 35 ug/min via INTRAVENOUS

## 2023-10-17 MED ORDER — ATROPINE SULFATE 1 MG/10ML IJ SOSY
PREFILLED_SYRINGE | INTRAMUSCULAR | Status: AC
  Filled 2023-10-17: qty 10

## 2023-10-17 MED ORDER — DEXAMETHASONE SODIUM PHOSPHATE 10 MG/ML IJ SOLN
INTRAMUSCULAR | Status: DC | PRN
  Administered 2023-10-17: 5 mg via INTRAVENOUS

## 2023-10-17 MED ORDER — MIDAZOLAM HCL 2 MG/2ML IJ SOLN
INTRAMUSCULAR | Status: AC
  Filled 2023-10-17: qty 2

## 2023-10-17 MED ORDER — NITROGLYCERIN 1 MG/10 ML FOR IR/CATH LAB
INTRA_ARTERIAL | Status: AC
  Filled 2023-10-17: qty 20

## 2023-10-17 MED ORDER — LIDOCAINE 2% (20 MG/ML) 5 ML SYRINGE
INTRAMUSCULAR | Status: DC | PRN
  Administered 2023-10-17: 100 mg via INTRAVENOUS

## 2023-10-17 MED ORDER — HEPARIN (PORCINE) IN NACL 1000-0.9 UT/500ML-% IV SOLN
INTRAVENOUS | Status: DC | PRN
  Administered 2023-10-17 (×3): 500 mL

## 2023-10-17 MED ORDER — NITROGLYCERIN 1 MG/10 ML FOR IR/CATH LAB
INTRA_ARTERIAL | Status: DC | PRN
  Administered 2023-10-17: 30 mL

## 2023-10-17 MED ORDER — ROCURONIUM BROMIDE 10 MG/ML (PF) SYRINGE
PREFILLED_SYRINGE | INTRAVENOUS | Status: DC | PRN
  Administered 2023-10-17: 10 mg via INTRAVENOUS
  Administered 2023-10-17: 70 mg via INTRAVENOUS

## 2023-10-17 MED ORDER — ONDANSETRON HCL 4 MG/2ML IJ SOLN
INTRAMUSCULAR | Status: DC | PRN
  Administered 2023-10-17: 4 mg via INTRAVENOUS

## 2023-10-17 MED ORDER — ACETAMINOPHEN 325 MG PO TABS: 650.0000 mg | ORAL_TABLET | ORAL | Status: DC | PRN

## 2023-10-17 MED ORDER — HEPARIN SODIUM (PORCINE) 1000 UNIT/ML IJ SOLN
INTRAMUSCULAR | Status: DC | PRN
  Administered 2023-10-17: 2000 [IU] via INTRAVENOUS
  Administered 2023-10-17: 15000 [IU] via INTRAVENOUS

## 2023-10-17 MED ORDER — PROTAMINE SULFATE 10 MG/ML IV SOLN
INTRAVENOUS | Status: DC | PRN
  Administered 2023-10-17: 40 mg via INTRAVENOUS

## 2023-10-17 MED ORDER — NITROGLYCERIN 1 MG/10 ML FOR IR/CATH LAB
INTRA_ARTERIAL | Status: AC
  Filled 2023-10-17: qty 10

## 2023-10-17 MED ORDER — SUGAMMADEX SODIUM 200 MG/2ML IV SOLN
INTRAVENOUS | Status: DC | PRN
  Administered 2023-10-17: 350 mg via INTRAVENOUS

## 2023-10-17 MED ORDER — SUGAMMADEX SODIUM 200 MG/2ML IV SOLN
INTRAVENOUS | Status: DC | PRN
Start: 2023-10-17 — End: 2023-10-17

## 2023-10-17 MED ORDER — PROPOFOL 10 MG/ML IV BOLUS
INTRAVENOUS | Status: DC | PRN
Start: 2023-10-17 — End: 2023-10-17
  Administered 2023-10-17: 50 mg via INTRAVENOUS
  Administered 2023-10-17: 100 mg via INTRAVENOUS
  Administered 2023-10-17: 50 mg via INTRAVENOUS

## 2023-10-17 MED ORDER — SODIUM CHLORIDE 0.9 % IV SOLN: 250.0000 mL | INTRAVENOUS | Status: DC | PRN

## 2023-10-17 MED ORDER — SODIUM CHLORIDE 0.9% FLUSH: 3.0000 mL | Freq: Two times a day (BID) | INTRAVENOUS | Status: DC

## 2023-10-17 MED ORDER — SODIUM CHLORIDE 0.9% FLUSH: 3.0000 mL | INTRAVENOUS | Status: DC | PRN

## 2023-10-17 MED ORDER — ATROPINE SULFATE 1 MG/ML IV SOLN
INTRAVENOUS | Status: DC | PRN
  Administered 2023-10-17: 1 mg via INTRAVENOUS

## 2023-10-17 MED ORDER — SODIUM CHLORIDE 0.9 % IV SOLN: INTRAVENOUS | Status: DC

## 2023-10-17 MED ORDER — FENTANYL CITRATE (PF) 250 MCG/5ML IJ SOLN
INTRAMUSCULAR | Status: DC | PRN
  Administered 2023-10-17 (×2): 50 ug via INTRAVENOUS

## 2023-10-17 MED ORDER — FENTANYL CITRATE (PF) 100 MCG/2ML IJ SOLN
INTRAMUSCULAR | Status: AC
  Filled 2023-10-17: qty 2

## 2023-10-17 MED ORDER — PHENYLEPHRINE 80 MCG/ML (10ML) SYRINGE FOR IV PUSH (FOR BLOOD PRESSURE SUPPORT)
PREFILLED_SYRINGE | INTRAVENOUS | Status: DC | PRN
  Administered 2023-10-17: 160 ug via INTRAVENOUS

## 2023-10-17 SURGICAL SUPPLY — 22 items
BAG SNAP BAND KOVER 36X36 (MISCELLANEOUS) IMPLANT
BLANKET WARM UNDERBOD FULL ACC (MISCELLANEOUS) ×1 IMPLANT
CABLE PFA RX CATH CONN (CABLE) IMPLANT
CATH FARAWAVE ABLATION 31 (CATHETERS) IMPLANT
CATH OCTARAY 2.0 F 3-3-3-3-3 (CATHETERS) IMPLANT
CATH SOUNDSTAR ECO 8FR (CATHETERS) IMPLANT
CATH WEB BI DIR CSDF CRV REPRO (CATHETERS) IMPLANT
CLOSURE MYNX CONTROL 6F/7F (Vascular Products) IMPLANT
CLOSURE PERCLOSE PROSTYLE (VASCULAR PRODUCTS) IMPLANT
COVER SWIFTLINK CONNECTOR (BAG) ×1 IMPLANT
DILATOR VESSEL 38 20CM 16FR (INTRODUCER) IMPLANT
GUIDEWIRE INQWIRE 1.5J.035X260 (WIRE) IMPLANT
INQWIRE 1.5J .035X260CM (WIRE) ×1 IMPLANT
KIT VERSACROSS CNCT FARADRIVE (KITS) IMPLANT
MAT PREVALON FULL STRYKER (MISCELLANEOUS) IMPLANT
PACK EP LF (CUSTOM PROCEDURE TRAY) ×1 IMPLANT
PAD DEFIB RADIO PHYSIO CONN (PAD) ×1 IMPLANT
PATCH CARTO3 (PAD) IMPLANT
SHEATH FARADRIVE STEERABLE (SHEATH) IMPLANT
SHEATH PINNACLE 8F 10CM (SHEATH) IMPLANT
SHEATH PINNACLE 9F 10CM (SHEATH) IMPLANT
SHEATH PROBE COVER 6X72 (BAG) IMPLANT

## 2023-10-17 NOTE — Transfer of Care (Signed)
 Immediate Anesthesia Transfer of Care Note  Patient: James [name].  Procedure(s) Performed: ATRIAL FIBRILLATION ABLATION  Patient Location: PACU  Anesthesia Type:General  Level of Consciousness: awake, alert , and oriented  Airway & Oxygen Therapy: Patient Spontanous Breathing  Post-op Assessment: Report given to RN  Post vital signs: Reviewed and stable  Last Vitals:  Vitals Value Taken Time  BP 115/63   Temp 35.7 C 10/17/23 1216  Pulse 68 10/17/23 1218  Resp 17 10/17/23 1218  SpO2 99 % 10/17/23 1218  Vitals shown include unfiled device data.  Last Pain:  Vitals:   10/17/23 1216  TempSrc: Temporal  PainSc: 0-No pain      Patients Stated Pain Goal: 4 (10/17/23 1610)  Complications: There were no known notable events for this encounter.

## 2023-10-17 NOTE — H&P (Signed)
  Electrophysiology Office Note:   Date:  10/17/2023  ID:  James Quint., DOB 03-Nov-1955, MRN [id number]  Primary Cardiologist: Rollene Rotunda, MD Primary Heart Failure: None Electrophysiologist: Zekiah Caruth Jorja Loa, MD      History of Present Illness:   James Reh. is a 68 y.o. male with h/o pretension, morbid obesity, atrial fibrillation, chronic systolic heart failure due to nonischemic cardiomyopathy seen today for routine electrophysiology followup.   His cardiomyopathy was discovered when he was diagnosed with atrial fibrillation.  Since then, with maintenance of sinus rhythm, his ejection fraction has normalized.  Today, denies symptoms of palpitations, chest pain, shortness of breath, orthopnea, PND, lower extremity edema, claudication, dizziness, presyncope, syncope, bleeding, or neurologic sequela. The patient is tolerating medications without difficulties. Plan ablation today.   EP Information / Studies Reviewed:    EKG is ordered today. Personal review as below.        Risk Assessment/Calculations:    CHA2DS2-VASc Score = 2   This indicates a 2.2% annual risk of stroke. The patient's score is based upon: CHF History: 1 HTN History: 1 Diabetes History: 0 Stroke History: 0 Vascular Disease History: 0 Age Score: 0 Gender Score: 0             Physical Exam:   VS:  BP 134/82   Pulse 86   Temp 97.7 F (36.5 C) (Oral)   Resp 17   Ht 5\' 10"  (1.778 m)   Wt 122.5 kg   SpO2 97%   BMI 38.74 kg/m    Wt Readings from Last 3 Encounters:  10/17/23 122.5 kg  09/09/23 124.4 kg  07/18/23 121.9 kg    GEN: No acute distress.   Neck: No JVD Cardiac: irregular, no murmurs, rubs, or gallops.  Respiratory: decreased BS bases bilaterally. GI: Soft, nontender, non-distended  MS: No edema; No deformity. Neuro:  Nonfocal  Skin: warm and dry Psych: Normal affect    ASSESSMENT AND PLAN:    1.  Longstanding persistent atrial fibrillation: James Quint.  has presented today for surgery, with the diagnosis of AF.  The various methods of treatment have been discussed with the patient and family. After consideration of risks, benefits and other options for treatment, the patient has consented to  Procedure(s): Catheter ablation as a surgical intervention .  Risks include but not limited to complete heart block, stroke, esophageal damage, nerve damage, bleeding, vascular damage, tamponade, perforation, MI, and death. The patient's history has been reviewed, patient examined, no change in status, stable for surgery.  I have reviewed the patient's chart and labs.  Questions were answered to the patient's satisfaction.    James Araque Elberta Fortis, MD 10/17/2023 8:08 AM

## 2023-10-17 NOTE — Discharge Instructions (Signed)

## 2023-10-17 NOTE — Anesthesia Preprocedure Evaluation (Signed)
 Anesthesia Evaluation  Patient identified by MRN, date of birth, ID band Patient awake    Reviewed: Allergy & Precautions, NPO status , Patient's Chart, lab work & pertinent test results, reviewed documented beta blocker date and time   Airway Mallampati: III  TM Distance: >3 FB     Dental no notable dental hx. (+) Dental Advisory Given, Caps, Teeth Intact   Pulmonary asthma , sleep apnea and Continuous Positive Airway Pressure Ventilation , former smoker   Pulmonary exam normal breath sounds clear to auscultation       Cardiovascular hypertension, Pt. on medications and Pt. on home beta blockers +CHF   Rhythm:Irregular Rate:Normal     Neuro/Psych negative neurological ROS  negative psych ROS   GI/Hepatic negative GI ROS, Neg liver ROS,,,  Endo/Other  diabetes, Well Controlled, Type 2    Renal/GU Renal disease  negative genitourinary   Musculoskeletal negative musculoskeletal ROS (+)    Abdominal  (+) + obese  Peds  Hematology Xarelto therapy- last dose this am   Anesthesia Other Findings   Reproductive/Obstetrics                              Anesthesia Physical Anesthesia Plan  ASA: 3  Anesthesia Plan: General   Post-op Pain Management: Minimal or no pain anticipated   Induction: Intravenous  PONV Risk Score and Plan: 2 and Treatment may vary due to age or medical condition, Ondansetron and Dexamethasone  Airway Management Planned: Oral ETT  Additional Equipment: None  Intra-op Plan:   Post-operative Plan: Extubation in OR  Informed Consent: I have reviewed the patients History and Physical, chart, labs and discussed the procedure including the risks, benefits and alternatives for the proposed anesthesia with the patient or authorized representative who has indicated his/her understanding and acceptance.     Dental advisory given  Plan Discussed with: Anesthesiologist  and CRNA  Anesthesia Plan Comments:          Anesthesia Quick Evaluation

## 2023-10-17 NOTE — Anesthesia Postprocedure Evaluation (Signed)
 Anesthesia Post Note  Patient: James [name].  Procedure(s) Performed: ATRIAL FIBRILLATION ABLATION     Patient location during evaluation: PACU Anesthesia Type: General Level of consciousness: awake and alert Pain management: pain level controlled Vital Signs Assessment: post-procedure vital signs reviewed and stable Respiratory status: spontaneous breathing, nonlabored ventilation, respiratory function stable and patient connected to nasal cannula oxygen Cardiovascular status: blood pressure returned to baseline and stable Postop Assessment: no apparent nausea or vomiting Anesthetic complications: no   There were no known notable events for this encounter.  Last Vitals:  Vitals:   10/17/23 1249 10/17/23 1400  BP:  117/68  Pulse:  65  Resp:  13  Temp: (!) 36.4 C   SpO2:  94%    Last Pain:  Vitals:   10/17/23 1310  TempSrc:   PainSc: 0-No pain                 [place]

## 2023-10-17 NOTE — Progress Notes (Signed)
 Patient has confirmed that he has not missed any doses of xarelto in past 4 weeks.

## 2023-10-18 ENCOUNTER — Encounter (HOSPITAL_COMMUNITY): Payer: Self-pay | Admitting: Cardiology

## 2023-10-18 ENCOUNTER — Telehealth (HOSPITAL_COMMUNITY): Payer: Self-pay

## 2023-10-18 MED FILL — Fentanyl Citrate Preservative Free (PF) Inj 100 MCG/2ML: INTRAMUSCULAR | Qty: 2 | Status: AC

## 2023-10-18 NOTE — Telephone Encounter (Signed)
 Spoke with patient to complete post procedure follow up call.  Patient reports no complications with groin sites.   Instructions reviewed with patient:  Remove large bandage at puncture site after 24 hours. It is normal to have bruising, tenderness and a pea or marble sized lump/knot at the groin site which can take up to three months to resolve.  Get help right away if you notice sudden swelling at the puncture site.  Check your puncture site every day for signs of infection: fever, redness, swelling, pus drainage, warmth, foul odor or excessive pain. If this occurs, please call the office at [phone], to speak with the nurse. Get help right away if your puncture site is bleeding and the bleeding does not stop after applying firm pressure to the area.  You may continue to have skipped beats/ atrial fibrillation during the first several months after your procedure.  It is very important not to miss any doses of your blood thinner Xarelto. Patient restarted taking this medication on yesterday, 10/17/23.   You will follow up with the Afib clinic on 11/18/23 and follow up with the APP on 01/16/24.   Patient verbalized understanding to all instructions provided.

## 2023-11-12 ENCOUNTER — Other Ambulatory Visit (HOSPITAL_COMMUNITY): Payer: Self-pay

## 2023-11-12 ENCOUNTER — Other Ambulatory Visit: Payer: Self-pay

## 2023-11-12 MED ORDER — AMIODARONE HCL 200 MG PO TABS: 200.0000 mg | ORAL_TABLET | Freq: Every day | ORAL | 3 refills | Status: DC

## 2023-11-12 MED ORDER — SPIRONOLACTONE 25 MG PO TABS
25.0000 mg | ORAL_TABLET | Freq: Every day | ORAL | 3 refills | Status: DC
  Filled 2023-11-12: qty 90, 90d supply, fill #0

## 2023-11-12 MED ORDER — SPIRONOLACTONE 25 MG PO TABS: 25.0000 mg | ORAL_TABLET | Freq: Every day | ORAL | 3 refills | Status: AC

## 2023-11-13 ENCOUNTER — Encounter: Payer: Self-pay | Admitting: Emergency Medicine

## 2023-11-18 ENCOUNTER — Ambulatory Visit (HOSPITAL_COMMUNITY): Admitting: Internal Medicine

## 2023-11-28 ENCOUNTER — Ambulatory Visit (HOSPITAL_COMMUNITY)
Admission: RE | Admit: 2023-11-28 | Discharge: 2023-11-28 | Disposition: A | Discharge status: Home / Self Care | Source: Ambulatory Visit | Attending: Internal Medicine | Admitting: Internal Medicine

## 2023-11-28 VITALS — BP 150/66 | HR 43 | Ht 70.0 in | Wt 258.6 lb

## 2023-11-28 DIAGNOSIS — D6869 Other thrombophilia: Secondary | ICD-10-CM

## 2023-11-28 DIAGNOSIS — I4891 Unspecified atrial fibrillation: Secondary | ICD-10-CM | POA: Diagnosis not present

## 2023-11-28 DIAGNOSIS — Z79899 Other long term (current) drug therapy: Secondary | ICD-10-CM

## 2023-11-28 DIAGNOSIS — Z5181 Encounter for therapeutic drug level monitoring: Secondary | ICD-10-CM

## 2023-11-28 DIAGNOSIS — I4811 Longstanding persistent atrial fibrillation: Secondary | ICD-10-CM

## 2023-11-28 MED ORDER — CARVEDILOL 25 MG PO TABS: ORAL_TABLET | ORAL | Status: AC

## 2023-11-28 NOTE — Progress Notes (Signed)
 Primary Care Physician: Patient, No Pcp Per Primary Cardiologist: Dr. Lavonne Prairie Primary Electrophysiologist: Dr. Lawana Pray Referring Physician: N/A   James [name]. is a 68 y.o. male with a history of tobacco use, hypertension, asthma, morbid obesity, nonischemic cardiomyopathy, obstructive sleep apnea, and longstanding persistent atrial fibrillation who presents for consultation in the [facility].  The patient was initially diagnosed with atrial fibrillation in 2017. He had TEE cardioversion at that time. He did not convert to sinus rhythm and was loaded on amiodarone . He did have a successful second attempt at cardioversion. He was found to have a dilated cardiomyopathy with ejection fraction 20 to 25%. He had a stress test that showed an inferior infarct with scarring and no ischemia. Follow-up echo showed an ejection fraction of 45% in 2019. January 2023 as ejection fraction was normal. Patient is on Xarelto  for a CHADS2VASC score of 2.  On evaluation today, he was seen by Dr. Lawana Pray on 09/14/22. He admitted to feeling fatigued and occasionally short of breath with all the walking he has to do at work. He was loaded on amiodarone  and plan to perform DCCV. He canceled DCCV due to cost. His plan is to wait until August when he turns 60 and has Medicare. He still feels like he has been in atrial fibrillation all the time. He does admit to still feeling fatigued with the quantity of walking he has to do at his work. No chest pain. He has been compliant with amiodarone  BID. He has been taking Xarelto  daily and has no bleeding concerns. He is compliant with CPAP. He admits to only taking losartan  in the morning and did not know he was supposed to take a half tablet in the afternoon.  On follow up 03/21/23, he is currently in atrial flutter. Patient does not have Medicare but would still like to go ahead and pursue DCCV. He missed a dose of Xarelto  2 days ago. Currently  taking amiodarone  200 mg daily.   On f/u 04/25/23, he is currently in atrial flutter. S/p amiodarone  reload and successful DCCV on 04/12/23 with conversion to NSR x 3 shocks. He feels like he went out of rhythm about 5 days after cardioversion. He does note that he felt better when he was in normal rhythm. He is currently taking amiodarone  200 mg daily. No missed doses of Xarelto .   On f/u 05/17/23, he is currently in atrial flutter. He feels tired when out of rhythm. He began amiodarone  reload at last visit to determine final time if able to maintain normal rhythm on amiodarone  or if discussion required with EP on next steps. He is currently taking amiodarone  200 mg BID. No missed doses of Xarelto .   On follow up 06/12/23, he is currently in atrial flutter. S/p successful DCCV with 360J defibrillator on 05/27/23. Patient states he was in NSR for about a week and he felt good. He actually still feels okay now because he cannot really feel his atrial flutter.   On follow up 11/28/23, patient is currently in NSR. S/p Afib ablation on 10/17/23 by Dr. Lawana Pray. No episodes of Afib since ablation. He is taking amiodarone  200 mg daily. He is feeling better in normal rhythm. No chest pain or SOB. Leg sites healed without issue. No missed doses of Xarelto  20 mg daily.  Today, he denies symptoms of orthopnea, PND, lower extremity edema, dizziness, presyncope, syncope, snoring, daytime somnolence, bleeding, or neurologic sequela. The patient is tolerating medications without difficulties  and is otherwise without complaint today.     Atrial Fibrillation Risk Factors:  he does have symptoms or diagnosis of sleep apnea. he is compliant with CPAP therapy. he does not have a history of rheumatic fever. he does not have a history of alcohol use. The patient does not have a history of early familial atrial fibrillation or other arrhythmias.  he has a BMI of Body mass index is 37.11 kg/m.Aaron Aas Filed Weights   11/28/23  1050  Weight: 117.3 kg    Family History  Problem Relation Age of Onset   CAD Mother        CABG age 2   CAD Maternal Aunt        CABG age 22    Atrial Fibrillation Management history:  Previous antiarrhythmic drugs: amiodarone  Previous cardioversions: 2017 x 2, 04/12/23, 05/27/23 Previous ablations: 10/17/23 CHADS2VASC score: 2 Anticoagulation history: Xarelto    Past Medical History:  Diagnosis Date   Asthma    Diabetes mellitus without complication (HCC)    Former tobacco use    Hypertension    Morbid obesity (HCC)    New onset atrial fibrillation (HCC) 07/31/2015   Non-ischemic cardiomyopathy (HCC)    Suspected sleep apnea    Past Surgical History:  Procedure Laterality Date   ATRIAL FIBRILLATION ABLATION N/A 10/17/2023   Procedure: ATRIAL FIBRILLATION ABLATION;  Surgeon: Lei Pump, MD;  Location: [facility] INVASIVE CV LAB;  Service: Cardiovascular;  Laterality: N/A;   CARDIOVERSION N/A 08/08/2015   Procedure: CARDIOVERSION;  Surgeon: Liza Riggers, MD;  Location: [facility] ENDOSCOPY;  Service: Cardiovascular;  Laterality: N/A;   CARDIOVERSION N/A 08/12/2015   Procedure: CARDIOVERSION;  Surgeon: Maudine Sos, MD;  Location: [facility] ENDOSCOPY;  Service: Cardiovascular;  Laterality: N/A;   CARDIOVERSION N/A 04/12/2023   Procedure: CARDIOVERSION;  Surgeon: Luana Rumple, MD;  Location: [facility] INVASIVE CV LAB;  Service: Cardiovascular;  Laterality: N/A;   CARDIOVERSION N/A 05/27/2023   Procedure: CARDIOVERSION;  Surgeon: Hazle Lites, MD;  Location: [facility] INVASIVE CV LAB;  Service: Cardiovascular;  Laterality: N/A;   MOUTH SURGERY     TEE WITHOUT CARDIOVERSION N/A 08/08/2015   Procedure: TRANSESOPHAGEAL ECHOCARDIOGRAM (TEE);  Surgeon: Liza Riggers, MD;  Location: [facility] ENDOSCOPY;  Service: Cardiovascular;  Laterality: N/A;    Current Outpatient Medications  Medication Sig Dispense Refill   amiodarone  (PACERONE ) 200 MG tablet Take 1 tablet (200 mg total) by mouth daily. 180  tablet 3   carvedilol  (COREG ) 25 MG tablet TAKE 1 TABLETS BY MOUTH IN THE MORNING AND TAKE HALF TABLET BY MOUTH IN THE AFTERNOON 270 tablet 3   losartan  (COZAAR ) 50 MG tablet TAKE 1 TABLET BY MOUTH IN THE MORNING AND  ONE-HALF  TABLET  IN  THE  AFTERNOON 135 tablet 3   Magnesium ([employer] TRIPLE MAGNESIUM COMPLEX) 400 MG CAPS Take 1 capsule by mouth daily.     Miconazole Nitrate 2 % AERP Apply 2 sprays topically daily as needed (irritation).     Omega-3 Fatty Acids (FISH OIL) 1000 MG CAPS Take 1,000 mg by mouth daily.     spironolactone  (ALDACTONE ) 25 MG tablet Take 1 tablet (25 mg total) by mouth daily. 90 tablet 3   XARELTO  20 MG TABS tablet TAKE 1 TABLET(20 MG) BY MOUTH DAILY WITH SUPPER 90 tablet 1   No current facility-administered medications for this encounter.    Allergies  Allergen Reactions   Penicillins Other (See Comments)    Childhood    ROS- All systems are reviewed and  negative except as per the HPI above.  Physical Exam: Vitals:   11/28/23 1050  Pulse: (!) 43  Weight: 117.3 kg  Height: 5\' 10"  (1.778 m)    GEN- The patient is well appearing, alert and oriented x 3 today.   Neck - no JVD or carotid bruit noted Lungs- Clear to ausculation bilaterally, normal work of breathing Heart- Regular bradycardic rate and rhythm, no murmurs, rubs or gallops, PMI not laterally displaced Extremities- no clubbing, cyanosis, or edema Skin - no rash or ecchymosis noted   Wt Readings from Last 3 Encounters:  11/28/23 117.3 kg  10/17/23 122.5 kg  09/09/23 124.4 kg    EKG today demonstrates Vent. rate 43 BPM PR interval 184 ms QRS duration 86 ms QT/QTcB 536/452 ms P-R-T axes 56 56 31 Marked sinus bradycardia Abnormal ECG When compared with ECG of 17-Oct-2023 12:34, Nonspecific T wave abnormality now evident in Anterior leads  Echo 08/21/21 demonstrated: 1. Left ventricular ejection fraction, by estimation, is 55 to 60%. The  left ventricle has normal function. The left  ventricle has no regional  wall motion abnormalities. Left ventricular diastolic parameters are  indeterminate.   2. Right ventricular systolic function is normal. The right ventricular  size is normal. There is normal pulmonary artery systolic pressure.   3. The mitral valve is normal in structure. Mild mitral valve  regurgitation.   4. The aortic valve is tricuspid. Aortic valve regurgitation is not  visualized. Aortic valve sclerosis is present, with no evidence of aortic  valve stenosis.   5. The inferior vena cava is dilated in size with <50% respiratory  variability, suggesting right atrial pressure of 15 mmHg.    Epic records are reviewed at length today  CHA2DS2-VASc Score = 3  The patient's score is based upon: CHF History: 1 HTN History: 1 Diabetes History: 0 Stroke History: 0 Vascular Disease History: 0 Age Score: 1 Gender Score: 0      ASSESSMENT AND PLAN: Longstanding Persistent Atrial Fibrillation (ICD10:  I48.11) / Atrial flutter The patient's CHA2DS2-VASc score is 3, indicating a 3.2% annual risk of stroke.   S/p successful DCCV on 04/12/23 x 3 shocks.  S/p successful DCCV with 360J defibrillator on 05/27/23.  S/p Afib/flutter ablation on 10/17/23 by Dr. Lawana Pray.   He is currently in NSR. Lower coreg  to 12.5 mg BID due to bradycardia.   High risk medication monitoring (ICD10: J342684) Patient requires ongoing monitoring for anti-arrhythmic medication which has the potential to cause life threatening arrhythmias or AV block. Qtc stable. Continue amiodarone  200 mg daily.   2. Secondary hypercoagulable state secondary to atrial fibrillation Continue Xarelto  without interruption.   Follow up with EP as scheduled.    Minnie Amber, PA-C [facility] [address] [place], [place] [address] [phone] 11/28/2023 11:00 AM

## 2023-11-28 NOTE — Patient Instructions (Signed)
 Please take carvedilol  half tablet (12.5 mg) in the morning and evening

## 2023-11-28 NOTE — Addendum Note (Signed)
 Encounter addended by: Ernestina Headland, CMA on: 11/28/2023 11:26 AM  Actions taken: Order list changed

## 2023-12-16 ENCOUNTER — Other Ambulatory Visit: Payer: Self-pay | Admitting: Cardiology

## 2023-12-16 DIAGNOSIS — I480 Paroxysmal atrial fibrillation: Secondary | ICD-10-CM

## 2024-01-15 NOTE — Progress Notes (Unsigned)
  Cardiology Office Note:   Date:  01/16/2024  ID:  James [name]., DOB [birth year], MRN [id number] PCP: Patient, No Pcp Per  [facility] Providers Cardiologist:  Eilleen Grates, MD Electrophysiologist:  Will Cortland Ding, MD  Sleep Medicine:  Gaylyn Keas, MD {  History of Present Illness:   James Broner. is a 68 y.o. male who presents for evaluation of atrial fib.   He was in the office in 2020.  with shortness of breath.  In 2017 he had atrial fibrillation. He subsequently needed TEE guided cardioversion. However, he did not convert to sinus rhythm with this and was treated with amiodarone . His second attempt at TEE cardioversion following this was successful. He has a dilated cardiomyopathy with an EF of 20-25%. Stress testing demonstrated an inferior wall defect which may have been artifact or prior inferior infarct with scarring. However, there was no ischemia.  The EF on follow up in July 2017 was back to normal.  It was 45% on echo in 2019.  He has been treated with amiodarone  and DCCV but converted back to flutter.   He has had flutter ablation. Last echo was 2023.  EF was 55 - 60%   He has not had any recurrent palpitations as far as he can tell. The patient denies any new symptoms such as chest discomfort, neck or arm discomfort. There has been no new shortness of breath, PND or orthopnea. There have been no reported palpitations, presyncope or syncope.    ROS: As stated in the HPI and negative for all other systems.  Studies Reviewed:    EKG:         Risk Assessment/Calculations:    CHA2DS2-VASc Score = 3   This indicates a 3.2% annual risk of stroke. The patient's score is based upon:   Physical Exam:   VS:  BP (!) 142/56   Pulse (!) 45   Ht 5' 10 (1.778 m)   Wt 257 lb 6.4 oz (116.8 kg)   SpO2 95%   BMI 36.93 kg/m    Wt Readings from Last 3 Encounters:  01/16/24 257 lb 6.4 oz (116.8 kg)  11/28/23 258 lb 9.6 oz (117.3 kg)  10/17/23 270 lb (122.5  kg)     GEN: Well nourished, well developed in no acute distress NECK: No JVD; No carotid bruits CARDIAC: RRR, no murmurs, rubs, gallops RESPIRATORY:  Clear to auscultation without rales, wheezing or rhonchi  ABDOMEN: Soft, non-tender, non-distended EXTREMITIES:  No edema; No deformity   ASSESSMENT AND PLAN:   DILATED CARDIOMYOPATHY:    EF was normal in January 2023.  No change in therapy.   ATRIAL FLUTTER:    [name] has a CHA2DS2 - VASc score of 2 .  I think he can stop his amiodarone .  We talked about getting a Kardia to monitor rhythms.  He will remain on other meds as listed.    HTN: His blood pressure is at target at home.  No change in therapy.     SLEEP APNEA: He is compliant with his CPAP.  OBESITY:   Now that he feels better he has been more active and losing weight.  I was proud of his weight loss and encouraged more of the same.     Follow up with Atrial Fib clinic.   Signed, Eilleen Grates, MD

## 2024-01-15 NOTE — Progress Notes (Unsigned)
 Cardiology Office Note:  .   Date:  01/15/2024  ID:  James Arabia., DOB February 11, 1956, MRN [id number] PCP: Patient, No Pcp Per  [facility] Providers Cardiologist:  James Grates, MD Electrophysiologist:  James Cortland Ding, MD  Sleep Medicine:  James Keas, MD {  History of Present Illness: .   James Enrique. is a 68 y.o. male w/PMHx of  OSA w/CPAP, obesity, HTN, *** DM DCM > recovered LVEF (?tachy-mediated) AFib  Last saw Dr. Lawana [name] 2.10/25, despite amiodarone  remained in symptomatic AFib and planned for ablation  Ablation 10/17/23  Saw the AFib clinic 11/28/23, maintaining SR, EKG w/SB 40's and coreg  reduced  Today's visit is scheduled as his post ablation visit ROS: ***  *** xarelto , dose, bleeding, labs *** volume *** brady? *** burden *** stop amio? Risk score is 3 or 4 *** DM??? Arrhythmia/AAD hx AFib found 2017 Amiodarone  started 2017 - 2018, restarted Feb 2024 AFib and CTI ablation 10/17/23  Studies Reviewed: James [name]    EKG done today and reviewed by myself:  ***   10/17/23: EPS/ablation CONCLUSIONS: 1. Sinus rhythm upon presentation.   2. Successful electrical isolation and anatomical encircling of all four pulmonary veins with pulsed field ablation. 3. Posterior wall isolation using pulse field ablation 4. Cavotricuspid isthmus ablation 5. No early apparent complications.  10/04/23: cardiac CT IMPRESSION: 1. There is normal pulmonary vein drainage into the left atrium. 2. There is no thrombus in the left atrial appendage. 3. The esophagus runs in the left atrial midline and is not in proximity to any of the pulmonary vein ostia. 4. No PFO/ASD. Lipomatous interatrial septum. 5. CAC score of 118, which is 58th percentile for age-, race-, and sex-matched controls.   Echo 08/21/21  1. Left ventricular ejection fraction, by estimation, is 55 to 60%. The  left ventricle has normal function. The left ventricle has no regional  wall motion  abnormalities. Left ventricular diastolic parameters are  indeterminate.   2. Right ventricular systolic function is normal. The right ventricular  size is normal. There is normal pulmonary artery systolic pressure.   3. The mitral valve is normal in structure. Mild mitral valve  regurgitation.   4. The aortic valve is tricuspid. Aortic valve regurgitation is not  visualized. Aortic valve sclerosis is present, with no evidence of aortic  valve stenosis.   5. The inferior vena cava is dilated in size with <50% respiratory  variability, suggesting right atrial pressure of 15 mmHg.      Risk Assessment/Calculations:    Physical Exam:   VS:  There were no vitals taken for this visit.   Wt Readings from Last 3 Encounters:  11/28/23 258 lb 9.6 oz (117.3 kg)  10/17/23 270 lb (122.5 kg)  09/09/23 274 lb 3.2 oz (124.4 kg)    GEN: Well nourished, well developed in no acute distress NECK: No JVD; No carotid bruits CARDIAC: ***RRR, no murmurs, rubs, gallops RESPIRATORY:  *** CTA b/l without rales, wheezing or rhonchi  ABDOMEN: Soft, non-tender, non-distended EXTREMITIES: *** No edema; No deformity   ASSESSMENT AND PLAN: .    Longstanding persistent AFib cha2DS2Vasc is 3-***4, on Xarelto , *** approprpiately dosed *** burden *** amiodarone   NICM Recovered LVEF by his last echo 2023 > 55-60% *** C/w Dr. Pecola [name]  Secondary hypercoagulable state 2/2 AFib     {Are you ordering a CV Procedure (e.g. stress test, cath, DCCV, TEE, etc)?   Press F2        :[id number]}  Dispo: ***  Signed, James Fails, PA-C

## 2024-01-16 ENCOUNTER — Ambulatory Visit: Admitting: Physician Assistant

## 2024-01-16 ENCOUNTER — Ambulatory Visit: Attending: Physician Assistant | Admitting: Physician Assistant

## 2024-01-16 ENCOUNTER — Ambulatory Visit: Payer: BC Managed Care – PPO | Attending: Cardiology | Admitting: Cardiology

## 2024-01-16 ENCOUNTER — Encounter: Payer: Self-pay | Admitting: Cardiology

## 2024-01-16 VITALS — BP 142/56 | HR 43 | Ht 70.0 in | Wt 257.0 lb

## 2024-01-16 VITALS — BP 142/56 | HR 45 | Ht 70.0 in | Wt 257.4 lb

## 2024-01-16 DIAGNOSIS — D6869 Other thrombophilia: Secondary | ICD-10-CM | POA: Diagnosis not present

## 2024-01-16 DIAGNOSIS — I10 Essential (primary) hypertension: Secondary | ICD-10-CM | POA: Diagnosis not present

## 2024-01-16 DIAGNOSIS — I4811 Longstanding persistent atrial fibrillation: Secondary | ICD-10-CM

## 2024-01-16 DIAGNOSIS — I4892 Unspecified atrial flutter: Secondary | ICD-10-CM

## 2024-01-16 DIAGNOSIS — I420 Dilated cardiomyopathy: Secondary | ICD-10-CM | POA: Diagnosis not present

## 2024-01-16 DIAGNOSIS — R001 Bradycardia, unspecified: Secondary | ICD-10-CM | POA: Diagnosis not present

## 2024-01-16 NOTE — Patient Instructions (Addendum)
 Medication Instructions:  Stop Amiodarone  *If you need a refill on your cardiac medications before your next appointment, please call your pharmacy*  Lab Work: NONE If you have labs (blood work) drawn today and your tests are completely normal, you will receive your results only by: MyChart Message (if you have MyChart) OR A paper copy in the mail If you have any lab test that is abnormal or we need to change your treatment, we will call you to review the results.  Testing/Procedures: NONE  Follow-Up: At [facility], you and your health needs are our priority.  As part of our continuing mission to provide you with exceptional heart care, our providers are all part of one team.  This team includes your primary Cardiologist (physician) and Advanced Practice Providers or APPs (Physician Assistants and Nurse Practitioners) who all work together to provide you with the care you need, when you need it.  Your next appointment:   6 months  Provider:   Lavonne Prairie, MD  We recommend signing up for the patient portal called MyChart.  Sign up information is provided on this After Visit Summary.  MyChart is used to connect with patients for Virtual Visits (Telemedicine).  Patients are able to view lab/test results, encounter notes, upcoming appointments, etc.  Non-urgent messages can be sent to your provider as well.   To learn more about what you can do with MyChart, go to [email or web address].   Other Instructions [facility] Device

## 2024-01-16 NOTE — Patient Instructions (Signed)
 Medication Instructions:   Your physician recommends that you continue on your current medications as directed. Please refer to the Current Medication list given to you today.   *If you need a refill on your cardiac medications before your next appointment, please call your pharmacy*   Lab Work: NONE ORDERED  TODAY     If you have labs (blood work) drawn today and your tests are completely normal, you will receive your results only by: MyChart Message (if you have MyChart) OR A paper copy in the mail If you have any lab test that is abnormal or we need to change your treatment, we will call you to review the results.  Testing/Procedures: NONE ORDERED  TODAY     Follow-Up: At [facility], you and your health needs are our priority.  As part of our continuing mission to provide you with exceptional heart care, our providers are all part of one team.  This team includes your primary Cardiologist (physician) and Advanced Practice Providers or APPs (Physician Assistants and Nurse Practitioners) who all work together to provide you with the care you need, when you need it.  Your next appointment:    1 year(s)  Provider:    You may see Will Cortland Ding, MD or one of the following Advanced Practice Providers on your designated Care Team:   Mertha Abrahams, [place]     We recommend signing up for the patient portal called "MyChart".  Sign up information is provided on this After Visit Summary.  MyChart is used to connect with patients for Virtual Visits (Telemedicine).  Patients are able to view lab/test results, encounter notes, upcoming appointments, etc.  Non-urgent messages can be sent to your provider as well.   To learn more about what you can do with MyChart, go to [email or web address].   Other Instructions

## 2024-04-27 ENCOUNTER — Other Ambulatory Visit: Payer: Self-pay | Admitting: Cardiology

## 2024-04-27 DIAGNOSIS — I480 Paroxysmal atrial fibrillation: Secondary | ICD-10-CM

## 2024-04-28 NOTE — Telephone Encounter (Signed)
 Prescription refill request for Xarelto  received.  Indication: a fib Last office visit: 01/16/24 Weight: 257# Age: 68 Scr: 1.46 epic 10/02/23 CrCl:  82 ml/min
